# Patient Record
Sex: Male | Born: 1940 | Race: White | Hispanic: No | Marital: Married | Smoking: Never smoker
Health system: Southern US, Community
[De-identification: ages and names within clinical notes are randomized; demographics above are authoritative.]

## PROBLEM LIST (undated history)

## (undated) DIAGNOSIS — N400 Enlarged prostate without lower urinary tract symptoms: Secondary | ICD-10-CM

## (undated) DIAGNOSIS — K449 Diaphragmatic hernia without obstruction or gangrene: Secondary | ICD-10-CM

## (undated) DIAGNOSIS — F419 Anxiety disorder, unspecified: Secondary | ICD-10-CM

## (undated) DIAGNOSIS — G4733 Obstructive sleep apnea (adult) (pediatric): Secondary | ICD-10-CM

## (undated) DIAGNOSIS — R112 Nausea with vomiting, unspecified: Secondary | ICD-10-CM

## (undated) DIAGNOSIS — Z9889 Other specified postprocedural states: Secondary | ICD-10-CM

## (undated) DIAGNOSIS — T7840XA Allergy, unspecified, initial encounter: Secondary | ICD-10-CM

## (undated) DIAGNOSIS — M810 Age-related osteoporosis without current pathological fracture: Secondary | ICD-10-CM

## (undated) DIAGNOSIS — E785 Hyperlipidemia, unspecified: Secondary | ICD-10-CM

## (undated) DIAGNOSIS — I350 Nonrheumatic aortic (valve) stenosis: Secondary | ICD-10-CM

## (undated) DIAGNOSIS — Z86010 Personal history of colonic polyps: Secondary | ICD-10-CM

## (undated) DIAGNOSIS — M1990 Unspecified osteoarthritis, unspecified site: Secondary | ICD-10-CM

## (undated) DIAGNOSIS — N200 Calculus of kidney: Secondary | ICD-10-CM

## (undated) DIAGNOSIS — K5792 Diverticulitis of intestine, part unspecified, without perforation or abscess without bleeding: Secondary | ICD-10-CM

## (undated) DIAGNOSIS — J986 Disorders of diaphragm: Secondary | ICD-10-CM

## (undated) DIAGNOSIS — K219 Gastro-esophageal reflux disease without esophagitis: Secondary | ICD-10-CM

## (undated) DIAGNOSIS — Z860101 Personal history of adenomatous and serrated colon polyps: Secondary | ICD-10-CM

## (undated) DIAGNOSIS — G4730 Sleep apnea, unspecified: Secondary | ICD-10-CM

## (undated) DIAGNOSIS — K2270 Barrett's esophagus without dysplasia: Secondary | ICD-10-CM

## (undated) DIAGNOSIS — M4800 Spinal stenosis, site unspecified: Secondary | ICD-10-CM

## (undated) HISTORY — DX: Sleep apnea, unspecified: G47.30

## (undated) HISTORY — DX: Hyperlipidemia, unspecified: E78.5

## (undated) HISTORY — PX: ROTATOR CUFF REPAIR: SHX139

## (undated) HISTORY — DX: Personal history of adenomatous and serrated colon polyps: Z86.0101

## (undated) HISTORY — DX: Allergy, unspecified, initial encounter: T78.40XA

## (undated) HISTORY — PX: EYE MUSCLE SURGERY: SHX370

## (undated) HISTORY — DX: Diverticulitis of intestine, part unspecified, without perforation or abscess without bleeding: K57.92

## (undated) HISTORY — DX: Gastro-esophageal reflux disease without esophagitis: K21.9

## (undated) HISTORY — PX: ESOPHAGOGASTRODUODENOSCOPY: SHX1529

## (undated) HISTORY — DX: Nonrheumatic aortic (valve) stenosis: I35.0

## (undated) HISTORY — DX: Benign prostatic hyperplasia without lower urinary tract symptoms: N40.0

## (undated) HISTORY — DX: Barrett's esophagus without dysplasia: K22.70

## (undated) HISTORY — PX: VASECTOMY: SHX75

## (undated) HISTORY — PX: KNEE ARTHROSCOPY: SUR90

## (undated) HISTORY — DX: Anxiety disorder, unspecified: F41.9

## (undated) HISTORY — DX: Diaphragmatic hernia without obstruction or gangrene: K44.9

## (undated) HISTORY — DX: Calculus of kidney: N20.0

## (undated) HISTORY — DX: Disorders of diaphragm: J98.6

## (undated) HISTORY — PX: TENDON REPAIR: SHX5111

## (undated) HISTORY — DX: Age-related osteoporosis without current pathological fracture: M81.0

## (undated) HISTORY — DX: Obstructive sleep apnea (adult) (pediatric): G47.33

## (undated) HISTORY — DX: Personal history of colonic polyps: Z86.010

## (undated) HISTORY — DX: Spinal stenosis, site unspecified: M48.00

## (undated) HISTORY — DX: Unspecified osteoarthritis, unspecified site: M19.90

---

## 2002-07-31 ENCOUNTER — Encounter: Payer: Self-pay | Admitting: Internal Medicine

## 2002-09-13 ENCOUNTER — Encounter: Payer: Self-pay | Admitting: Internal Medicine

## 2002-09-18 ENCOUNTER — Encounter: Payer: Self-pay | Admitting: Gastroenterology

## 2002-09-18 ENCOUNTER — Ambulatory Visit (HOSPITAL_COMMUNITY): Admission: RE | Admit: 2002-09-18 | Discharge: 2002-09-18 | Payer: Self-pay | Admitting: Gastroenterology

## 2003-03-26 ENCOUNTER — Encounter: Payer: Self-pay | Admitting: Internal Medicine

## 2004-09-15 ENCOUNTER — Ambulatory Visit: Payer: Self-pay | Admitting: Internal Medicine

## 2004-10-08 ENCOUNTER — Ambulatory Visit: Payer: Self-pay | Admitting: Gastroenterology

## 2005-03-22 ENCOUNTER — Ambulatory Visit: Payer: Self-pay | Admitting: Internal Medicine

## 2005-04-26 ENCOUNTER — Ambulatory Visit: Payer: Self-pay | Admitting: Internal Medicine

## 2005-07-01 ENCOUNTER — Ambulatory Visit: Payer: Self-pay | Admitting: Internal Medicine

## 2005-07-29 ENCOUNTER — Ambulatory Visit: Payer: Self-pay | Admitting: Internal Medicine

## 2005-08-22 ENCOUNTER — Ambulatory Visit: Payer: Self-pay | Admitting: Internal Medicine

## 2005-10-04 ENCOUNTER — Ambulatory Visit: Payer: Self-pay | Admitting: Internal Medicine

## 2006-02-08 ENCOUNTER — Ambulatory Visit: Payer: Self-pay | Admitting: Internal Medicine

## 2006-03-29 ENCOUNTER — Ambulatory Visit: Payer: Self-pay | Admitting: Internal Medicine

## 2006-04-20 ENCOUNTER — Ambulatory Visit: Payer: Self-pay | Admitting: Internal Medicine

## 2006-07-24 ENCOUNTER — Ambulatory Visit: Payer: Self-pay | Admitting: Internal Medicine

## 2006-08-08 ENCOUNTER — Ambulatory Visit: Payer: Self-pay | Admitting: Gastroenterology

## 2006-08-21 ENCOUNTER — Encounter (INDEPENDENT_AMBULATORY_CARE_PROVIDER_SITE_OTHER): Payer: Self-pay | Admitting: Specialist

## 2006-08-21 ENCOUNTER — Ambulatory Visit: Payer: Self-pay | Admitting: Gastroenterology

## 2006-10-03 ENCOUNTER — Ambulatory Visit: Payer: Self-pay | Admitting: Internal Medicine

## 2006-10-09 ENCOUNTER — Ambulatory Visit: Payer: Self-pay | Admitting: Internal Medicine

## 2006-10-09 DIAGNOSIS — J309 Allergic rhinitis, unspecified: Secondary | ICD-10-CM | POA: Insufficient documentation

## 2006-10-09 DIAGNOSIS — K219 Gastro-esophageal reflux disease without esophagitis: Secondary | ICD-10-CM | POA: Insufficient documentation

## 2006-10-09 DIAGNOSIS — E785 Hyperlipidemia, unspecified: Secondary | ICD-10-CM | POA: Insufficient documentation

## 2006-10-09 DIAGNOSIS — K5790 Diverticulosis of intestine, part unspecified, without perforation or abscess without bleeding: Secondary | ICD-10-CM | POA: Insufficient documentation

## 2007-03-02 ENCOUNTER — Ambulatory Visit: Payer: Self-pay | Admitting: Internal Medicine

## 2007-04-27 ENCOUNTER — Telehealth (INDEPENDENT_AMBULATORY_CARE_PROVIDER_SITE_OTHER): Payer: Self-pay | Admitting: *Deleted

## 2007-04-30 ENCOUNTER — Ambulatory Visit: Payer: Self-pay | Admitting: Internal Medicine

## 2007-05-07 ENCOUNTER — Ambulatory Visit: Payer: Self-pay | Admitting: Family Medicine

## 2007-06-05 ENCOUNTER — Ambulatory Visit: Payer: Self-pay | Admitting: Internal Medicine

## 2007-06-07 ENCOUNTER — Ambulatory Visit: Payer: Self-pay | Admitting: Gastroenterology

## 2007-06-14 ENCOUNTER — Ambulatory Visit (HOSPITAL_COMMUNITY): Admission: RE | Admit: 2007-06-14 | Discharge: 2007-06-14 | Payer: Self-pay | Admitting: Gastroenterology

## 2007-09-21 ENCOUNTER — Ambulatory Visit: Payer: Self-pay | Admitting: Internal Medicine

## 2007-09-21 DIAGNOSIS — M48062 Spinal stenosis, lumbar region with neurogenic claudication: Secondary | ICD-10-CM | POA: Insufficient documentation

## 2007-09-21 DIAGNOSIS — M48061 Spinal stenosis, lumbar region without neurogenic claudication: Secondary | ICD-10-CM | POA: Insufficient documentation

## 2007-10-08 ENCOUNTER — Encounter: Payer: Self-pay | Admitting: Internal Medicine

## 2007-10-19 ENCOUNTER — Ambulatory Visit (HOSPITAL_COMMUNITY): Admission: RE | Admit: 2007-10-19 | Discharge: 2007-10-19 | Payer: Self-pay | Admitting: Neurosurgery

## 2007-10-22 ENCOUNTER — Ambulatory Visit: Payer: Self-pay | Admitting: Cardiology

## 2007-11-06 ENCOUNTER — Telehealth: Payer: Self-pay | Admitting: Family Medicine

## 2007-12-12 ENCOUNTER — Ambulatory Visit: Payer: Self-pay | Admitting: Internal Medicine

## 2007-12-12 DIAGNOSIS — N138 Other obstructive and reflux uropathy: Secondary | ICD-10-CM | POA: Insufficient documentation

## 2007-12-12 DIAGNOSIS — N401 Enlarged prostate with lower urinary tract symptoms: Secondary | ICD-10-CM

## 2007-12-12 DIAGNOSIS — F411 Generalized anxiety disorder: Secondary | ICD-10-CM | POA: Insufficient documentation

## 2007-12-12 LAB — CONVERTED CEMR LAB
Direct LDL: 140 mg/dL
Ketones, urine, test strip: NEGATIVE
Nitrite: NEGATIVE
PSA: 3.85 ng/mL (ref 0.10–4.00)
Specific Gravity, Urine: 1.01
Total CHOL/HDL Ratio: 5.6
Triglycerides: 143 mg/dL (ref 0–149)
Urobilinogen, UA: 0.2
VLDL: 29 mg/dL (ref 0–40)
WBC Urine, dipstick: NEGATIVE

## 2007-12-17 LAB — CONVERTED CEMR LAB
Albumin: 4.4 g/dL (ref 3.5–5.2)
Basophils Absolute: 0.1 10*3/uL (ref 0.0–0.1)
Basophils Relative: 0.8 % (ref 0.0–3.0)
CO2: 31 meq/L (ref 19–32)
Chloride: 100 meq/L (ref 96–112)
Eosinophils Absolute: 0.6 10*3/uL (ref 0.0–0.7)
GFR calc non Af Amer: 79 mL/min
Glucose, Bld: 87 mg/dL (ref 70–99)
Hemoglobin: 14.7 g/dL (ref 13.0–17.0)
Lymphocytes Relative: 22.2 % (ref 12.0–46.0)
MCHC: 34.4 g/dL (ref 30.0–36.0)
MCV: 86 fL (ref 78.0–100.0)
Neutro Abs: 4.3 10*3/uL (ref 1.4–7.7)
PSA: 5.21 ng/mL — ABNORMAL HIGH (ref 0.10–4.00)
Potassium: 4.3 meq/L (ref 3.5–5.1)
RBC: 4.96 M/uL (ref 4.22–5.81)
RDW: 12.7 % (ref 11.5–14.6)
Sodium: 138 meq/L (ref 135–145)
TSH: 0.74 microintl units/mL (ref 0.35–5.50)
Total Bilirubin: 1.1 mg/dL (ref 0.3–1.2)

## 2008-03-24 ENCOUNTER — Telehealth: Payer: Self-pay | Admitting: Internal Medicine

## 2008-04-21 ENCOUNTER — Encounter: Payer: Self-pay | Admitting: Internal Medicine

## 2008-04-22 ENCOUNTER — Telehealth: Payer: Self-pay | Admitting: Internal Medicine

## 2008-05-09 HISTORY — PX: LIPOMA EXCISION: SHX5283

## 2008-08-04 ENCOUNTER — Ambulatory Visit: Payer: Self-pay | Admitting: Internal Medicine

## 2008-08-04 DIAGNOSIS — R972 Elevated prostate specific antigen [PSA]: Secondary | ICD-10-CM | POA: Insufficient documentation

## 2008-08-13 DIAGNOSIS — K2270 Barrett's esophagus without dysplasia: Secondary | ICD-10-CM | POA: Insufficient documentation

## 2008-08-13 DIAGNOSIS — I10 Essential (primary) hypertension: Secondary | ICD-10-CM | POA: Insufficient documentation

## 2008-08-13 DIAGNOSIS — K589 Irritable bowel syndrome without diarrhea: Secondary | ICD-10-CM | POA: Insufficient documentation

## 2008-08-13 DIAGNOSIS — K449 Diaphragmatic hernia without obstruction or gangrene: Secondary | ICD-10-CM | POA: Insufficient documentation

## 2008-08-13 DIAGNOSIS — K3184 Gastroparesis: Secondary | ICD-10-CM | POA: Insufficient documentation

## 2008-08-28 ENCOUNTER — Ambulatory Visit: Payer: Self-pay | Admitting: Gastroenterology

## 2008-09-09 ENCOUNTER — Encounter: Payer: Self-pay | Admitting: Internal Medicine

## 2008-09-09 ENCOUNTER — Ambulatory Visit: Payer: Self-pay

## 2008-09-11 ENCOUNTER — Encounter: Payer: Self-pay | Admitting: Internal Medicine

## 2008-10-13 ENCOUNTER — Encounter: Payer: Self-pay | Admitting: Internal Medicine

## 2008-10-13 ENCOUNTER — Encounter: Admission: RE | Admit: 2008-10-13 | Discharge: 2008-10-13 | Payer: Self-pay | Admitting: Neurosurgery

## 2008-10-15 ENCOUNTER — Encounter: Payer: Self-pay | Admitting: Internal Medicine

## 2008-10-24 ENCOUNTER — Encounter: Payer: Self-pay | Admitting: Cardiology

## 2008-10-30 ENCOUNTER — Ambulatory Visit: Payer: Self-pay | Admitting: Internal Medicine

## 2008-11-24 ENCOUNTER — Telehealth: Payer: Self-pay | Admitting: Internal Medicine

## 2008-12-04 DIAGNOSIS — E669 Obesity, unspecified: Secondary | ICD-10-CM | POA: Insufficient documentation

## 2008-12-09 ENCOUNTER — Encounter: Payer: Self-pay | Admitting: Physician Assistant

## 2008-12-09 ENCOUNTER — Ambulatory Visit: Payer: Self-pay | Admitting: Cardiovascular Disease

## 2008-12-10 ENCOUNTER — Telehealth (INDEPENDENT_AMBULATORY_CARE_PROVIDER_SITE_OTHER): Payer: Self-pay | Admitting: *Deleted

## 2008-12-11 ENCOUNTER — Encounter: Payer: Self-pay | Admitting: Cardiology

## 2008-12-11 ENCOUNTER — Ambulatory Visit: Payer: Self-pay

## 2008-12-15 ENCOUNTER — Encounter: Payer: Self-pay | Admitting: Internal Medicine

## 2008-12-17 ENCOUNTER — Telehealth: Payer: Self-pay | Admitting: Cardiology

## 2008-12-25 ENCOUNTER — Telehealth (INDEPENDENT_AMBULATORY_CARE_PROVIDER_SITE_OTHER): Payer: Self-pay | Admitting: *Deleted

## 2008-12-25 ENCOUNTER — Ambulatory Visit: Payer: Self-pay | Admitting: Cardiology

## 2008-12-26 ENCOUNTER — Inpatient Hospital Stay (HOSPITAL_BASED_OUTPATIENT_CLINIC_OR_DEPARTMENT_OTHER): Admission: RE | Admit: 2008-12-26 | Discharge: 2008-12-26 | Payer: Self-pay | Admitting: Cardiology

## 2008-12-26 ENCOUNTER — Ambulatory Visit: Payer: Self-pay | Admitting: Cardiology

## 2008-12-29 LAB — CONVERTED CEMR LAB
Basophils Absolute: 0 10*3/uL (ref 0.0–0.1)
CO2: 31 meq/L (ref 19–32)
Calcium: 8.5 mg/dL (ref 8.4–10.5)
Creatinine, Ser: 1.1 mg/dL (ref 0.4–1.5)
Eosinophils Absolute: 0.2 10*3/uL (ref 0.0–0.7)
Glucose, Bld: 86 mg/dL (ref 70–99)
INR: 1 (ref 0.8–1.0)
Lymphocytes Relative: 17.6 % (ref 12.0–46.0)
MCHC: 34.8 g/dL (ref 30.0–36.0)
Neutrophils Relative %: 69.3 % (ref 43.0–77.0)
Prothrombin Time: 10.6 s (ref 9.1–11.7)
RBC: 4.55 M/uL (ref 4.22–5.81)
RDW: 13.6 % (ref 11.5–14.6)
aPTT: 25.7 s (ref 21.7–28.8)

## 2009-01-05 ENCOUNTER — Ambulatory Visit: Payer: Self-pay | Admitting: Cardiology

## 2009-02-23 ENCOUNTER — Encounter: Payer: Self-pay | Admitting: Internal Medicine

## 2009-03-26 ENCOUNTER — Encounter: Payer: Self-pay | Admitting: Neurosurgery

## 2009-04-07 ENCOUNTER — Ambulatory Visit: Payer: Self-pay | Admitting: Internal Medicine

## 2009-04-08 ENCOUNTER — Encounter: Payer: Self-pay | Admitting: Neurosurgery

## 2009-05-09 ENCOUNTER — Encounter: Payer: Self-pay | Admitting: Neurosurgery

## 2009-05-22 ENCOUNTER — Ambulatory Visit (HOSPITAL_COMMUNITY)
Admission: RE | Admit: 2009-05-22 | Discharge: 2009-05-22 | Payer: Self-pay | Source: Home / Self Care | Admitting: Orthopedic Surgery

## 2009-06-03 ENCOUNTER — Encounter: Payer: Self-pay | Admitting: Cardiology

## 2009-07-10 ENCOUNTER — Ambulatory Visit: Payer: Self-pay | Admitting: Internal Medicine

## 2009-07-10 DIAGNOSIS — M159 Polyosteoarthritis, unspecified: Secondary | ICD-10-CM | POA: Insufficient documentation

## 2009-07-15 ENCOUNTER — Encounter (INDEPENDENT_AMBULATORY_CARE_PROVIDER_SITE_OTHER): Payer: Self-pay | Admitting: *Deleted

## 2009-07-15 LAB — CONVERTED CEMR LAB
ALT: 18 units/L (ref 0–53)
AST: 20 units/L (ref 0–37)
BUN: 9 mg/dL (ref 6–23)
Basophils Absolute: 0 10*3/uL (ref 0.0–0.1)
Bilirubin, Direct: 0.2 mg/dL (ref 0.0–0.3)
Calcium: 8.9 mg/dL (ref 8.4–10.5)
Eosinophils Absolute: 0.3 10*3/uL (ref 0.0–0.7)
Glucose, Bld: 92 mg/dL (ref 70–99)
Hemoglobin: 13.3 g/dL (ref 13.0–17.0)
Lymphocytes Relative: 15.9 % (ref 12.0–46.0)
MCHC: 33.9 g/dL (ref 30.0–36.0)
Monocytes Absolute: 0.6 10*3/uL (ref 0.1–1.0)
Neutro Abs: 5.7 10*3/uL (ref 1.4–7.7)
Phosphorus: 3.3 mg/dL (ref 2.3–4.6)
Potassium: 4 meq/L (ref 3.5–5.1)
RDW: 12.2 % (ref 11.5–14.6)
Total Bilirubin: 0.9 mg/dL (ref 0.3–1.2)

## 2009-08-21 ENCOUNTER — Encounter: Payer: Self-pay | Admitting: Internal Medicine

## 2009-10-13 ENCOUNTER — Ambulatory Visit: Payer: Self-pay | Admitting: Family Medicine

## 2009-11-03 ENCOUNTER — Telehealth: Payer: Self-pay | Admitting: Internal Medicine

## 2009-11-05 ENCOUNTER — Telehealth: Payer: Self-pay | Admitting: Gastroenterology

## 2009-11-18 ENCOUNTER — Encounter (INDEPENDENT_AMBULATORY_CARE_PROVIDER_SITE_OTHER): Payer: Self-pay

## 2009-11-20 ENCOUNTER — Ambulatory Visit: Payer: Self-pay | Admitting: Gastroenterology

## 2009-11-27 ENCOUNTER — Ambulatory Visit: Payer: Self-pay | Admitting: Gastroenterology

## 2009-11-27 LAB — HM COLONOSCOPY

## 2009-11-30 ENCOUNTER — Telehealth: Payer: Self-pay | Admitting: Gastroenterology

## 2009-12-02 ENCOUNTER — Encounter: Payer: Self-pay | Admitting: Gastroenterology

## 2009-12-18 ENCOUNTER — Ambulatory Visit: Payer: Self-pay | Admitting: Internal Medicine

## 2010-01-08 ENCOUNTER — Encounter: Payer: Self-pay | Admitting: Internal Medicine

## 2010-02-02 ENCOUNTER — Encounter: Payer: Self-pay | Admitting: Internal Medicine

## 2010-02-03 ENCOUNTER — Ambulatory Visit: Payer: Self-pay | Admitting: Internal Medicine

## 2010-02-15 ENCOUNTER — Telehealth: Payer: Self-pay | Admitting: Family Medicine

## 2010-02-24 ENCOUNTER — Telehealth: Payer: Self-pay | Admitting: Internal Medicine

## 2010-03-04 ENCOUNTER — Encounter: Payer: Self-pay | Admitting: Internal Medicine

## 2010-03-04 ENCOUNTER — Ambulatory Visit: Payer: Self-pay | Admitting: Internal Medicine

## 2010-04-06 ENCOUNTER — Encounter: Payer: Self-pay | Admitting: Cardiology

## 2010-04-19 ENCOUNTER — Ambulatory Visit: Payer: Self-pay | Admitting: Cardiology

## 2010-04-19 ENCOUNTER — Encounter: Payer: Self-pay | Admitting: Cardiology

## 2010-04-26 ENCOUNTER — Ambulatory Visit: Payer: Self-pay | Admitting: Internal Medicine

## 2010-05-09 HISTORY — PX: JOINT REPLACEMENT: SHX530

## 2010-05-21 ENCOUNTER — Ambulatory Visit
Admission: RE | Admit: 2010-05-21 | Discharge: 2010-05-21 | Payer: Self-pay | Source: Home / Self Care | Attending: Internal Medicine | Admitting: Internal Medicine

## 2010-05-21 DIAGNOSIS — R0982 Postnasal drip: Secondary | ICD-10-CM | POA: Insufficient documentation

## 2010-05-26 LAB — DIFFERENTIAL
Basophils Absolute: 0 10*3/uL (ref 0.0–0.1)
Basophils Relative: 1 % (ref 0–1)
Eosinophils Absolute: 0.4 10*3/uL (ref 0.0–0.7)
Eosinophils Relative: 5 % (ref 0–5)
Lymphocytes Relative: 20 % (ref 12–46)
Lymphs Abs: 1.5 10*3/uL (ref 0.7–4.0)
Monocytes Absolute: 0.7 10*3/uL (ref 0.1–1.0)
Monocytes Relative: 9 % (ref 3–12)
Neutro Abs: 5 10*3/uL (ref 1.7–7.7)
Neutrophils Relative %: 65 % (ref 43–77)

## 2010-05-26 LAB — COMPREHENSIVE METABOLIC PANEL
ALT: 23 U/L (ref 0–53)
AST: 24 U/L (ref 0–37)
Albumin: 4.2 g/dL (ref 3.5–5.2)
Alkaline Phosphatase: 45 U/L (ref 39–117)
BUN: 12 mg/dL (ref 6–23)
CO2: 29 mEq/L (ref 19–32)
Calcium: 9.6 mg/dL (ref 8.4–10.5)
Chloride: 103 mEq/L (ref 96–112)
Creatinine, Ser: 0.99 mg/dL (ref 0.4–1.5)
GFR calc Af Amer: >60 mL/min (ref >60)
GFR calc non Af Amer: >60 mL/min (ref >60)
Glucose, Bld: 103 mg/dL — ABNORMAL HIGH (ref 70–99)
Potassium: 4.4 mEq/L (ref 3.5–5.1)
Sodium: 140 mEq/L (ref 135–145)
Total Bilirubin: 1.2 mg/dL (ref 0.3–1.2)
Total Protein: 6.3 g/dL (ref 6.0–8.3)

## 2010-05-26 LAB — CBC
HCT: 43.5 % (ref 39.0–52.0)
Hemoglobin: 14.5 g/dL (ref 13.0–17.0)
MCH: 28.2 pg (ref 26.0–34.0)
MCHC: 33.3 g/dL (ref 30.0–36.0)
MCV: 84.5 fL (ref 78.0–100.0)
Platelets: 223 10*3/uL (ref 150–400)
RBC: 5.15 MIL/uL (ref 4.22–5.81)
RDW: 13.4 % (ref 11.5–15.5)
WBC: 7.7 10*3/uL (ref 4.0–10.5)

## 2010-05-26 LAB — SURGICAL PCR SCREEN
MRSA, PCR: NEGATIVE
Staphylococcus aureus: NEGATIVE

## 2010-05-26 LAB — URINALYSIS, ROUTINE W REFLEX MICROSCOPIC
Bilirubin Urine: NEGATIVE
Hgb urine dipstick: NEGATIVE
Ketones, ur: NEGATIVE mg/dL
Nitrite: NEGATIVE
Protein, ur: NEGATIVE mg/dL
Specific Gravity, Urine: 1.022 (ref 1.005–1.030)
Urine Glucose, Fasting: NEGATIVE mg/dL
Urobilinogen, UA: 1 mg/dL (ref 0.0–1.0)
pH: 6 (ref 5.0–8.0)

## 2010-05-26 LAB — APTT: aPTT: 28 seconds (ref 24–37)

## 2010-05-26 LAB — PROTIME-INR
INR: 1.02 (ref 0.00–1.49)
Prothrombin Time: 13.6 seconds (ref 11.6–15.2)

## 2010-05-30 ENCOUNTER — Encounter: Payer: Self-pay | Admitting: Neurosurgery

## 2010-05-31 ENCOUNTER — Inpatient Hospital Stay (HOSPITAL_COMMUNITY)
Admission: RE | Admit: 2010-05-31 | Discharge: 2010-06-03 | Payer: Self-pay | Source: Home / Self Care | Attending: Orthopedic Surgery | Admitting: Orthopedic Surgery

## 2010-05-31 LAB — URINE CULTURE
Colony Count: NO GROWTH
Culture  Setup Time: 201201171503
Culture: NO GROWTH

## 2010-06-01 LAB — ABO/RH: ABO/RH(D): A POS

## 2010-06-02 LAB — CBC
HCT: 29.6 % — ABNORMAL LOW (ref 39.0–52.0)
HCT: 26.7 % — ABNORMAL LOW (ref 39.0–52.0)
Hemoglobin: 9.8 g/dL — ABNORMAL LOW (ref 13.0–17.0)
Hemoglobin: 8.9 g/dL — ABNORMAL LOW (ref 13.0–17.0)
MCH: 28.2 pg (ref 26.0–34.0)
MCH: 28.3 pg (ref 26.0–34.0)
MCHC: 33.1 g/dL (ref 30.0–36.0)
MCHC: 33.3 g/dL (ref 30.0–36.0)
MCV: 85.1 fL (ref 78.0–100.0)
MCV: 85 fL (ref 78.0–100.0)
Platelets: 200 10*3/uL (ref 150–400)
Platelets: 151 10*3/uL (ref 150–400)
RBC: 3.48 MIL/uL — ABNORMAL LOW (ref 4.22–5.81)
RBC: 3.14 MIL/uL — ABNORMAL LOW (ref 4.22–5.81)
RDW: 13.5 % (ref 11.5–15.5)
RDW: 13.5 % (ref 11.5–15.5)
WBC: 11.4 10*3/uL — ABNORMAL HIGH (ref 4.0–10.5)
WBC: 8.2 10*3/uL (ref 4.0–10.5)

## 2010-06-02 LAB — BASIC METABOLIC PANEL
BUN: 19 mg/dL (ref 6–23)
BUN: 12 mg/dL (ref 6–23)
CO2: 25 mEq/L (ref 19–32)
CO2: 28 mEq/L (ref 19–32)
Calcium: 7.5 mg/dL — ABNORMAL LOW (ref 8.4–10.5)
Calcium: 7.8 mg/dL — ABNORMAL LOW (ref 8.4–10.5)
Chloride: 108 mEq/L (ref 96–112)
Chloride: 102 mEq/L (ref 96–112)
Creatinine, Ser: 1.02 mg/dL (ref 0.4–1.5)
Creatinine, Ser: 0.95 mg/dL (ref 0.4–1.5)
GFR calc Af Amer: >60 mL/min (ref >60)
GFR calc Af Amer: >60 mL/min (ref >60)
GFR calc non Af Amer: >60 mL/min (ref >60)
GFR calc non Af Amer: >60 mL/min (ref >60)
Glucose, Bld: 138 mg/dL — ABNORMAL HIGH (ref 70–99)
Glucose, Bld: 121 mg/dL — ABNORMAL HIGH (ref 70–99)
Potassium: 5.5 mEq/L — ABNORMAL HIGH (ref 3.5–5.1)
Potassium: 3.7 mEq/L (ref 3.5–5.1)
Sodium: 138 mEq/L (ref 135–145)
Sodium: 134 mEq/L — ABNORMAL LOW (ref 135–145)

## 2010-06-03 LAB — BASIC METABOLIC PANEL
BUN: 10 mg/dL (ref 6–23)
CO2: 29 mEq/L (ref 19–32)
Calcium: 8 mg/dL — ABNORMAL LOW (ref 8.4–10.5)
Chloride: 100 mEq/L (ref 96–112)
Creatinine, Ser: 0.93 mg/dL (ref 0.4–1.5)
GFR calc Af Amer: >60 mL/min (ref >60)
GFR calc non Af Amer: >60 mL/min (ref >60)
Glucose, Bld: 112 mg/dL — ABNORMAL HIGH (ref 70–99)
Potassium: 3.7 mEq/L (ref 3.5–5.1)
Sodium: 138 mEq/L (ref 135–145)

## 2010-06-03 LAB — CBC
HCT: 23.3 % — ABNORMAL LOW (ref 39.0–52.0)
Hemoglobin: 7.9 g/dL — ABNORMAL LOW (ref 13.0–17.0)
MCH: 28.2 pg (ref 26.0–34.0)
MCHC: 33.9 g/dL (ref 30.0–36.0)
MCV: 83.2 fL (ref 78.0–100.0)
Platelets: 156 10*3/uL (ref 150–400)
RBC: 2.8 MIL/uL — ABNORMAL LOW (ref 4.22–5.81)
RDW: 13.1 % (ref 11.5–15.5)
WBC: 7.2 10*3/uL (ref 4.0–10.5)

## 2010-06-03 LAB — HEMOGLOBIN AND HEMATOCRIT, BLOOD: HCT: 23.7 % — ABNORMAL LOW (ref 39.0–52.0)

## 2010-06-04 ENCOUNTER — Encounter: Payer: Self-pay | Admitting: Internal Medicine

## 2010-06-04 LAB — TYPE AND SCREEN
ABO/RH(D): A POS
Antibody Screen: NEGATIVE
Unit division: 0
Unit division: 0

## 2010-06-06 ENCOUNTER — Emergency Department: Payer: Self-pay | Admitting: Emergency Medicine

## 2010-06-06 LAB — CONVERTED CEMR LAB
ALT: 25 units/L (ref 0–53)
AST: 20 units/L (ref 0–37)
BUN: 19 mg/dL (ref 6–23)
Basophils Relative: 0.5 % (ref 0.0–3.0)
Chloride: 105 meq/L (ref 96–112)
Eosinophils Relative: 5 % (ref 0.0–5.0)
GFR calc non Af Amer: 89.43 mL/min (ref >60)
HCT: 41.8 % (ref 39.0–52.0)
Hemoglobin: 14.2 g/dL (ref 13.0–17.0)
Lymphs Abs: 1.6 10*3/uL (ref 0.7–4.0)
Monocytes Relative: 8.6 % (ref 3.0–12.0)
Platelets: 193 10*3/uL (ref 150.0–400.0)
Potassium: 4.1 meq/L (ref 3.5–5.1)
RBC: 4.85 M/uL (ref 4.22–5.81)
Sodium: 141 meq/L (ref 135–145)
TSH: 1.24 microintl units/mL (ref 0.35–5.50)
Total Bilirubin: 1.1 mg/dL (ref 0.3–1.2)
Total Protein: 6.1 g/dL (ref 6.0–8.3)
WBC: 7.8 10*3/uL (ref 4.5–10.5)

## 2010-06-08 NOTE — Progress Notes (Signed)
Summary: white spots in mouth  Phone Note Call from Patient Call back at [phone]   Caller: Patient Summary of Call: Pt finished his antibiotic on saturday and now has white spots in his mouth.  Mouth is sore.  He is asking that something be [facility] to [facility] [place], [phone].  I told him he would need to be seen but he wanted me to send a note. Initial call taken by: Lowella Petties CMA,  February 24, 2010 2:43 PM  Follow-up for Phone Call        Okay to send Rx for mycostatin susp 5cc swish and spit four times daily till clear #16 ounces x 0  will need to be seen if this doesn't clear it Follow-up by: Cindee Salt MD,  February 25, 2010 10:30 AM  Additional Follow-up for Phone Call Additional follow up Details #1::        rx sent to [facility] [place], left message on machine that pt would need to be seen if not better. Additional Follow-up by: Mervin Hack CMA Duncan Dull),  February 25, 2010 1:01 PM    New/Updated Medications: NYSTATIN 100000 UNIT/ML SUSP (NYSTATIN) 5cc swish and spit four times daily till clear Prescriptions: NYSTATIN 100000 UNIT/ML SUSP (NYSTATIN) 5cc swish and spit four times daily till clear  #16oz x 0   Entered by:   Mervin Hack CMA (AAMA)   Authorized by:   Cindee Salt MD   Signed by:   Mervin Hack CMA (AAMA) on 02/25/2010   Method used:   Electronically to        [facility]. [phone]* (retail)       [address]       [place], [place]  [id number]       Ph: [phone]       Fax: [phone]   RxID:   [id number]

## 2010-06-08 NOTE — Letter (Signed)
Summary: Patient Notice-[facility]  [address] [place], [place] [address]   Phone: [phone]  Fax: [phone]        December 02, 2009 MRN: [id number]    [facility] [address] [place], [place]  [address]    Dear [name],  I am pleased to inform you that the biopsies taken during your recent endoscopic examination did not show any evidence of cancer upon pathologic examination.  However, your biopsies indicate you have a condition known as Barrett's esophagus. While not cancer, it is pre-cancerous (can progress to cancer) and needs to be monitored with repeat endoscopic examination and biopsies.  Fortunately, it is quite rare that this develops into cancer, but careful monitoring of the condition along with taking your medication as prescribed is important in reducing the risk of developing cancer.  It is my recommendation that you have a repeat upper gastrointestinal endoscopic examination in 3_ years.  Additional information/recommendations:  __Please call [phone] to schedule a return visit to further      evaluate your condition.  x__Continue with treatment plan as outlined the day of your exam.  Please call us if you have or develop heartburn, reflux symptoms, any swallowing problems, or if you have questions about your condition that have not been fully answered at this time.  Sincerely,  Mardella Layman MD [facility]  This letter has been electronically signed by your physician.  Appended Document: Patient Notice-[facility] letter mailed

## 2010-06-08 NOTE — Procedures (Signed)
Summary: Colonoscopy  Patient: [name] Note: All result statuses are Final unless otherwise noted.  Tests: (1) Colonoscopy (COL)   COL Colonoscopy           DONE     [facility]     [address]     [place], [place]  [address]           COLONOSCOPY PROCEDURE REPORT           PATIENT:  [name], [name]  MR#:  [id number]     BIRTHDATE:  1940/06/09, 69 yrs. old  GENDER:  male     ENDOSCOPIST:  Vania Rea. Jarold Motto, MD, [facility]     REF. BY:     PROCEDURE DATE:  11/27/2009     PROCEDURE:  Colonoscopy with snare polypectomy     ASA CLASS:  Class II     INDICATIONS:  Routine Risk Screening     MEDICATIONS:   Fentanyl 50 mcg IV, Versed 7 mg IV           DESCRIPTION OF PROCEDURE:   After the risks benefits and     alternatives of the procedure were thoroughly explained, informed     consent was obtained.  Digital rectal exam was performed and     revealed no abnormalities.   The LB [id number] endoscope     was introduced through the anus and advanced to the cecum, which     was identified by both the appendix and ileocecal valve, without     limitations.  The quality of the prep was excellent, using     MoviPrep.  The instrument was then slowly withdrawn as the colon     was fully examined.     <<PROCEDUREIMAGES>>           FINDINGS:  Moderate diverticulosis was found in the sigmoid to     descending colon segments.  A sessile polyp was found. FLAT     POLYP COLD SNARE EXCISED.TO PATH.  This was otherwise a normal     examination of the colon.   Retroflexed views in the rectum     revealed no abnormalities.    The scope was then withdrawn from     the patient and the procedure completed.           COMPLICATIONS:  None     ENDOSCOPIC IMPRESSION:     1) Moderate diverticulosis in the sigmoid to descending colon     segments     2) Sessile polyp     3) Otherwise normal examination     R/O ADENOMA     RECOMMENDATIONS:     1) Follow up colonoscopy in 5 years    2) high fiber diet     REPEAT EXAM:  No           ______________________________     Vania Rea. Jarold Motto, MD, [facility]           CC:  Karie Schwalbe, MD           n.     Rosalie DoctorMarland Kitchen   Vania Rea. Patterson at 11/27/2009 02:49 PM           Melodie Bouillon, [id number]  Note: An exclamation mark (!) indicates a result that was not dispersed into the flowsheet. Document Creation Date: 11/27/2009 2:50 PM _______________________________________________________________________  (1) Order result status: Final Collection or observation date-time: 11/27/2009 14:43 Requested date-time:  Receipt date-time:  Reported date-time:  Referring  Physician:   Ordering Physician: Sheryn Bison [id number]) Specimen Source:  Source: Launa Grill Order Number: [id number] Lab site:   Appended Document: Colonoscopy     Procedures Next Due Date:    Colonoscopy: 11/2014

## 2010-06-08 NOTE — Letter (Signed)
Summary: [facility] Instructions  [facility]  [address] [place], [place] [address]   Phone: [phone]  Fax: [phone]       CARTEL MAUSS    69-30-1942    MRN: [id number]        Procedure Day Dorna Bloom:  Farrell Ours  11/27/09     Arrival Time:  1:00PM     Procedure Time:  2:00PM     Location of Procedure:                    _ X_  [facility] (4th Floor)                        PREPARATION FOR COLONOSCOPY WITH MOVIPREP   Starting 5 days prior to your procedure 11/22/09 do not eat nuts, seeds, popcorn, corn, beans, peas,  salads, or any raw vegetables.  Do not take any fiber supplements (e.g. Metamucil, Citrucel, and Benefiber).  THE DAY BEFORE YOUR PROCEDURE         DATE: 11/26/09   DAY: THURSDAY  1.  Drink clear liquids the entire day-NO SOLID FOOD  2.  Do not drink anything colored red or purple.  Avoid juices with pulp.  No orange juice.  3.  Drink at least 64 oz. (8 glasses) of fluid/clear liquids during the day to prevent dehydration and help the prep work efficiently.  CLEAR LIQUIDS INCLUDE: Water Jello Ice Popsicles Tea (sugar ok, no milk/cream) Powdered fruit flavored drinks Coffee (sugar ok, no milk/cream) Gatorade Juice: apple, white grape, white cranberry  Lemonade Clear bullion, consomm, broth Carbonated beverages (any kind) Strained chicken noodle soup Hard Candy                             4.  In the morning, mix first dose of MoviPrep solution:    Empty 1 Pouch A and 1 Pouch B into the disposable container    Add lukewarm drinking water to the top line of the container. Mix to dissolve    Refrigerate (mixed solution should be used within 24 hrs)  5.  Begin drinking the prep at 5:00 p.m. The MoviPrep container is divided by 4 marks.   Every 15 minutes drink the solution down to the next mark (approximately 8 oz) until the full liter is complete.   6.  Follow completed prep with 16 oz of clear liquid of your choice  (Nothing red or purple).  Continue to drink clear liquids until bedtime.  7.  Before going to bed, mix second dose of MoviPrep solution:    Empty 1 Pouch A and 1 Pouch B into the disposable container    Add lukewarm drinking water to the top line of the container. Mix to dissolve    Refrigerate  THE DAY OF YOUR PROCEDURE      DATE: 11/27/09  DAY: FRIDAY  Beginning at 9:00AM (5 hours before procedure):         1. Every 15 minutes, drink the solution down to the next mark (approx 8 oz) until the full liter is complete.  2. Follow completed prep with 16 oz. of clear liquid of your choice.    3. You may drink clear liquids until 12:00PM (2 HOURS BEFORE PROCEDURE).   MEDICATION INSTRUCTIONS  Unless otherwise instructed, you should take regular prescription medications with a small sip of water   as early as possible the  morning of your procedure.         OTHER INSTRUCTIONS  You will need a responsible adult at least 69 years of age to accompany you and drive you home.   This person must remain in the waiting room during your procedure.  Wear loose fitting clothing that is easily removed.  Leave jewelry and other valuables at home.  However, you may wish to bring a book to read or  an iPod/MP3 player to listen to music as you wait for your procedure to start.  Remove all body piercing jewelry and leave at home.  Total time from sign-in until discharge is approximately 2-3 hours.  You should go home directly after your procedure and rest.  You can resume normal activities the  day after your procedure.  The day of your procedure you should not:   Drive   Make legal decisions   Operate machinery   Drink alcohol   Return to work  You will receive specific instructions about eating, activities and medications before you leave.    The above instructions have been reviewed and explained to me by   69lis Rias RN  November 20, 2009 9:05 AM     I fully understand and  can verbalize these instructions _____________________________ Date _________

## 2010-06-08 NOTE — Letter (Signed)
Summary: [facility]   Imported By: Sherian Rein 01/20/2010 09:44:47  _____________________________________________________________________  External Attachment:    Type:   Image     Comment:   External Document  Appended Document: [facility] doing okay on finasteride and doxazosin Biopsy negative last year and following PSA

## 2010-06-08 NOTE — Letter (Signed)
Summary: Endoscopy Letter  [facility]  [address] [place], [place] [address]   Phone: [phone]  Fax: [phone]      July 15, 2009 MRN: [id number]   [facility] [address] [place], [place]  [address]   Dear Mr. Spiers,   According to your medical record, it is time for you to schedule an Endoscopy. Endoscopic screening is recommended for patients with certain upper digestive tract conditions because of associated increased risk for cancers of the upper digestive system.  This letter has been generated based on the recommendations made at the time of your prior procedure. If you feel that in your particular situation this may no longer apply, please contact our office.  Please call our office at [phone]) to schedule this appointment or to update your records at your earliest convenience.  Thank you for cooperating with [place] to provide you with the very best care possible.   Sincerely,   Vania Rea. Jarold Motto, M.D.  [facility] [phone]

## 2010-06-08 NOTE — Progress Notes (Signed)
Summary: Refill   Phone Note Call from Patient   Summary of Call: Pt req written Rx for 90 day supply of nexium.  Mail to pt. Initial call taken by: Ashok Cordia RN,  November 30, 2009 8:16 AM  Follow-up for Phone Call        Rx mailed as requested. Follow-up by: Ashok Cordia RN,  November 30, 2009 8:19 AM    Prescriptions: NEXIUM 40 MG CPDR (ESOMEPRAZOLE MAGNESIUM) 1 capsule by mouth once a day  #90 x 3   Entered by:   Ashok Cordia RN   Authorized by:   Mardella Layman MD [facility]   Signed by:   Ashok Cordia RN on 11/30/2009   Method used:   Print then Give to Patient   RxID:   [id number]

## 2010-06-08 NOTE — Letter (Signed)
Summary: Patient Notice- Polyp Results  [facility]  [address] [place], [place] [address]   Phone: [phone]  Fax: [phone]        December 02, 2009 MRN: [id number]    [facility] [address] [place], [place]  [address]    Dear [name],  I am pleased to inform you that the colon polyp(s) removed during your recent colonoscopy was (were) found to be benign (no cancer detected) upon pathologic examination.  I recommend you have a repeat colonoscopy examination in 5_ years to look for recurrent polyps, as having colon polyps increases your risk for having recurrent polyps or even colon cancer in the future.  Should you develop new or worsening symptoms of abdominal pain, bowel habit changes or bleeding from the rectum or bowels, please schedule an evaluation with either your primary care physician or with me.  Additional information/recommendations:  __ No further action with gastroenterology is needed at this time. Please      follow-up with your primary care physician for your other healthcare      needs.  __ Please call [phone] to schedule a return visit to review your      situation.  __ Please keep your follow-up visit as already scheduled.  _x_ Continue treatment plan as outlined the day of your exam.  Please call us if you are having persistent problems or have questions about your condition that have not been fully answered at this time.  Sincerely,  Arthur Layman MD [facility]  This letter has been electronically signed by your physician.  Appended Document: Patient Notice- Polyp Results letter mailed

## 2010-06-08 NOTE — Progress Notes (Signed)
Summary: not any better  Phone Note Call from Patient Call back at [phone]   Summary of Call: Pt was seen 9/28 for bronchitis.  He was told to call back if not better.  He said he was getting better with doxy, but since finishing it he is getting worse again.  Dr Alphonsus Sias had told him he would call in quinolone if not better.  Pt [facility] [address]. Initial call taken by: Lowella Petties CMA,  February 15, 2010 9:09 AM  Follow-up for Phone Call        ok, will try avelox. Ruthe Mannan MD  February 15, 2010 10:35 AM  Spoke with patient and advised results.   Follow-up by: Mervin Hack CMA (AAMA),  February 15, 2010 11:02 AM    New/Updated Medications: AVELOX 400 MG  TABS (MOXIFLOXACIN HCL) 1 tablet by mouth daily x 5 days. Prescriptions: AVELOX 400 MG  TABS (MOXIFLOXACIN HCL) 1 tablet by mouth daily x 5 days.  #5 x 0   Entered and Authorized by:   Ruthe Mannan MD   Signed by:   Ruthe Mannan MD on 02/15/2010   Method used:   Electronically to        [facility] [address] (retail)       [address]       [place], [place]  [address]       Ph: [phone]       Fax: [phone]   RxID:   [id number]

## 2010-06-08 NOTE — Assessment & Plan Note (Signed)
Summary: sinus ear throat problems/dlo   Vital Signs:  Patient profile:   70 year old male Height:      61 inches Weight:      235.50 pounds BMI:     44.66 Temp:     98.5 degrees F oral Pulse rate:   68 / minute Pulse rhythm:   regular BP sitting:   104 / 80  (left arm) Cuff size:   large  Vitals Entered By: Linde Gillis CMA Duncan Dull) (October 13, 2009 2:15 PM) CC: sinus, ear, throat problems   History of Present Illness: Pt here for right ear ache and knot on the throat on the right side, headache in frontal area, went to acute care on Sat and was given a Zpak and told to take [facility] D and was sent home. He has suffered greatly!! Laying down at night has caused poor breathinbg, can't breathe and nasal spray given yresars ago doesn't help any more. and coughs so hard. He went to son's to babysit last night and left due to cough.  He had low grade fever to 1000 last FDri, none since, headache as above, riyght ear pain, no nasal discharge but gets discolored mucous from it (dark yellow has changed to mild yellow now) throat no longer sore but ear is, cough is productive of yellow mucous, he is SOB with no N/V since Fri. He is taking Zithro and Mucinex D. Stopped yesterday.  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 1 Capsule By Mouth Once A Day 2)  Fluticasone Propionate 50 Mcg/act Susp (Fluticasone Propionate) .... Spray 2 Spray Into Both Nostrils As Needed 3)  Pravastatin Sodium 20 Mg  Tabs (Pravastatin Sodium) .... Take 1 Tablet By Mouth Once A Day 4)  Ativan 0.5 Mg  Tabs (Lorazepam) .... 1/2-1 Tab By Mouth At Night As Needed For Anxiety. 5)  Doxazosin Mesylate 4 Mg  Tabs (Doxazosin Mesylate) .Marland Kitchen.. 1 At Bedtime 6)  Aspir-Low 81 Mg Tbec (Aspirin) .... Take One Daily  Allergies: 1)  Simvastatin (Simvastatin) 2)  Lipitor (Atorvastatin Calcium) 3)  * Pentathol  Physical Exam  General:  Well-developed,well-nourished,in no acute distress; alert,appropriate and cooperative throughout examination, not overtly congested. Head:  no maxillary or frontal tenderness Eyes:  Conjunctiva clear bilaterally.  Ears:  External ear exam shows no significant lesions or deformities.  Otoscopic examination reveals clear canals, tympanic membranes are intact bilaterally without bulging, retraction, inflammation or discharge. Hearing is grossly normal bilaterally. Wax bilaterally, discusssed irrigation. Nose:  moderate left congestion, mild on right Mouth:  no erythema and no exudates.   Neck:  supple, no masses, no thyromegaly, no carotid bruits, and no cervical lymphadenopathy.  Lungs:  ROnchi throughput worse in the bases Heart:  normal rate, regular rhythm, no murmur, and no gallop.     Impression & Recommendations:  Problem # 1:  BRONCHITIS- ACUTE (ICD-466.0) Assessment New  See instructions. His updated medication list for this problem includes:    Amoxicillin 500 Mg Caps (Amoxicillin) .Marland Kitchen... 2 tabs by mouth two times a day  Take antibiotics and other medications as directed. Encouraged to push clear liquids, get enough rest, and  take acetaminophen as needed. To be seen in 5-7 days if no improvement, sooner if worse.  Complete Medication List: 1)  Nexium 40 Mg Cpdr (Esomeprazole magnesium) .Marland Kitchen.. 1 capsule by mouth once a day 2)  Fluticasone Propionate 50 Mcg/act Susp (Fluticasone propionate) .... Spray 2 spray into both nostrils as needed 3)  Pravastatin Sodium 20 Mg Tabs (Pravastatin sodium) .... Take 1 tablet by mouth once a day 4)  Ativan 0.5 Mg Tabs (Lorazepam) .... 1/2-1 tab by mouth at night as needed for anxiety. 5)  Doxazosin Mesylate 4 Mg Tabs (Doxazosin mesylate) .Marland Kitchen.. 1 at bedtime 6)  Aspir-low 81 Mg Tbec (Aspirin) .... Take one daily 7)  Amoxicillin 500 Mg Caps (Amoxicillin) .... 2 tabs by mouth two times a day  Patient Instructions: 1)  Add Amox 2)  Take Guaifenesin by [facility] and getting MUCOUS RELIEF EXPECTORANT (400mg ), take 11/2 tabs by mouth AM and NOON. 3)  Drink lots of fluids anytime taking Guaifenesin.  4)  PPP&D 5)  Lozenge as needed. Prescriptions: AMOXICILLIN 500 MG CAPS (AMOXICILLIN) 2 tabs by mouth two times a day  #40 x 0   Entered and Authorized by:   Shaune Leeks MD   Signed by:   Shaune Leeks MD on 10/13/2009   Method used:   Electronically to        [facility] [address] [phone]* (retail)       [address] [place]  [address]       Ph: [phone]       Fax: [phone]   RxID:   [id number]   Current Allergies (reviewed today): SIMVASTATIN (SIMVASTATIN) LIPITOR (ATORVASTATIN CALCIUM) * PENTATHOL

## 2010-06-08 NOTE — Procedures (Signed)
Summary: Upper Endoscopy  Patient: Oral Remache Note: All result statuses are Final unless otherwise noted.  Tests: (1) Upper Endoscopy (EGD)   EGD Upper Endoscopy       DONE     [facility]     [address]     [place], [place]  [address]           ENDOSCOPY PROCEDURE REPORT           PATIENT:  [name], [name]  MR#:  [id number]     BIRTHDATE:  03/30/1941, 69 yrs. old  GENDER:  male           ENDOSCOPIST:  Vania Rea. Jarold Motto, MD, [facility]     Referred by:           PROCEDURE DATE:  11/27/2009     PROCEDURE:  EGD with biopsy     ASA CLASS:  Class II     INDICATIONS:  h/o Barrett's Esophagus           MEDICATIONS:   There was residual sedation effect present from     prior procedure., Fentanyl 25 mcg IV, Versed 1 mg IV     TOPICAL ANESTHETIC:           DESCRIPTION OF PROCEDURE:   After the risks benefits and     alternatives of the procedure were thoroughly explained, informed     consent was obtained.  [facility] [id number] endoscope was     introduced through the mouth and advanced to the second portion of     the duodenum, limited by retching and gagging.   The instrument     was slowly withdrawn as the mucosa was fully examined.     <<PROCEDUREIMAGES>>           A hiatal hernia was found. 5 CM. HH NOTED  Normal duodenal folds     were noted.  The stomach was entered and closely examined. The     antrum, angularis, and lesser curvature were well visualized,     including a retroflexed view of the cardia and fundus. The stomach     wall was normally distensable. The scope passed easily through the     pylorus into the duodenum.  irregular Z-line. SHORT SEGMENT     BARRETT'S BIOPSIED.NO INFLAMMATION.  The esophagus and     gastroesophageal junction were completely normal in appearance.     Retroflexed views revealed a hiatal hernia.    The scope was then     withdrawn from the patient and the procedure completed.           COMPLICATIONS:  None        ENDOSCOPIC IMPRESSION:     1) Hiatal hernia     2) Normal duodenal folds     3) Normal stomach     4) Irregular Z-line     5) Normal esophagus     6) A hiatal hernia     CHRONIC GERD.SHORT SEGMENT BARRETT'S.R/O DYSPLASIA.     RECOMMENDATIONS:     1) Await biopsy results     2) REPEAT SURVEILLANCE EGD IN 3 YEARS     3) continue PPI           REPEAT EXAM:  No           ______________________________     Vania Rea. Jarold Motto, MD, [facility]           CC:  Karie Schwalbe, MD  n.     eSIGNED:   Vania Rea. Astoria Condon at 11/27/2009 02:58 PM           Melodie Bouillon, [id number]  Note: An exclamation mark (!) indicates a result that was not dispersed into the flowsheet. Document Creation Date: 11/27/2009 2:59 PM _______________________________________________________________________  (1) Order result status: Final Collection or observation date-time: 11/27/2009 14:53 Requested date-time:  Receipt date-time:  Reported date-time:  Referring Physician:   Ordering Physician: Sheryn Bison [id number]) Specimen Source:  Source: Launa Grill Order Number: [id number] Lab site:   Appended Document: Upper Endoscopy     Procedures Next Due Date:    EGD: 11/2012

## 2010-06-08 NOTE — Miscellaneous (Signed)
Summary: Lec previsit  Clinical Lists Changes  Medications: Added new medication of MOVIPREP 100 GM  SOLR (PEG-KCL-NACL-NASULF-NA ASC-C) As per prep instructions. - Signed Rx of MOVIPREP 100 GM  SOLR (PEG-KCL-NACL-NASULF-NA ASC-C) As per prep instructions.;  #1 x 0;  Signed;  Entered by: Ulis Rias RN;  Authorized by: Mardella Layman MD [facility];  Method used: Electronically to [facility] [address], [place], [place], [place]  [address], Ph: [phone], Fax: [phone] Observations: Added new observation of ALLERGY REV: Done (11/20/2009 8:51)    Prescriptions: MOVIPREP 100 GM  SOLR (PEG-KCL-NACL-NASULF-NA ASC-C) As per prep instructions.  #1 x 0   Entered by:   Ulis Rias RN   Authorized by:   Mardella Layman MD [facility]   Signed by:   Ulis Rias RN on 11/20/2009   Method used:   Electronically to        [facility] [address] (retail)       [address]       [place], [place]  [address]       Ph: [phone]       Fax: [phone]   RxID:   [id number]

## 2010-06-08 NOTE — Assessment & Plan Note (Signed)
Summary: 3-4 MONTH FOLLOW UP/RBH   Vital Signs:  Patient profile:   70 year old male Weight:      237 pounds Temp:     98.3 degrees F oral Pulse rate:   72 / minute Pulse rhythm:   regular BP sitting:   120 / 68  (left arm) Cuff size:   large  Vitals Entered By: Mervin Hack CMA Duncan Dull) (July 10, 2009 8:00 AM) CC: 3-4 month follow-up   History of Present Illness: Tried to pick up long heavy box over Christmas tore left rotator cuff and had surgery  Back is doing well Knee is doing poorly Hopes to have TKR soon has gotten cortisone shot but not much help  No heart troubles No chest pain Has been limited in exercise due to rotator cuff No SOB  Several prostate biopsies Due to go back to Dr Annabell Howells Voids okay on doxazosin Nocturia only x 1  Stomach quiet stays on nexium daily  Allergies: 1)  Simvastatin (Simvastatin) 2)  Lipitor (Atorvastatin Calcium) 3)  * Pentathol  Past History:  Past medical, surgical, family and social histories (including risk factors) reviewed for relevance to current acute and chronic problems.  Past Medical History: Allergic rhinitis Diverticulosis, colon GERD--wit reflux esophagitis and Barretts---------------------Dr Jarold Motto Hyperlipidemia Spinal stenosis Benign prostatic hypertrophy Anxiety Hypertension Osteoarthritis  Past Surgical History: Kidney stones--multiple times Left knee tendon repair--1966 Vasectomy/spermatocele/?testicular cyst--1970 R rotator cuff repair--1/00  Thurston Hole) L eye muscle surgery--2/01 Stress test negative--5/04 EGD/colon--6/04 EGD--Barrett's--7/05 EGD--Barrett's but no cancer--4/08 709 Arthroscopy left knee--Dr Thurston Hole L4-5 repair--  8/10    -------------Dr Rise Mu ([facility]) Left rotator cuff repeair-- 06/2009   Dr Thurston Hole  Family History: Reviewed history from 12/04/2008 and no changes required. Dad died of lung cancer @87  Mom died of heart trouble @87 .  Had HTN 2 brothers No other CAD or  HTN No DM Mom had breast cancer No prostate or colon cancer  Social History: Reviewed history from 12/04/2008 and no changes required. Retired from various businesses Married--3 children Never Smoked Alcohol use-no Illicit Drug Use - no  Review of Systems       appetite is okay---has really been watching his diet weight down 4# sleeps fine  Physical Exam  General:  alert and normal appearance.   Neck:  supple, no masses, no thyromegaly, no carotid bruits, and no cervical lymphadenopathy.   Lungs:  normal respiratory effort and normal breath sounds.   Heart:  normal rate, regular rhythm, no murmur, and no gallop.   Abdomen:  soft and non-tender.   Extremities:  no sig edema Psych:  normally interactive, good eye contact, not anxious appearing, and not depressed appearing.     Impression & Recommendations:  Problem # 1:  HYPERTENSION (ICD-401.9) Assessment Unchanged  BP fine without specific meds  His updated medication list for this problem includes:    Doxazosin Mesylate 4 Mg Tabs (Doxazosin mesylate) .Marland Kitchen... 1 at bedtime  BP today: 120/68 Prior BP: 110/70 (04/07/2009)  Labs Reviewed: K+: 3.7 (12/25/2008) Creat: : 1.1 (12/25/2008)   Chol: 202 (10/09/2006)   HDL: 35.8 (10/09/2006)   LDL: DEL (10/09/2006)   TG: 143 (10/09/2006)  Orders: TLB-Renal Function Panel (80069-RENAL) TLB-CBC Platelet - w/Differential (85025-CBCD) TLB-TSH (Thyroid Stimulating Hormone) (84443-TSH)  Problem # 2:  HYPERLIPIDEMIA (ICD-272.4) Assessment: Unchanged  no problems on meds due for labs  His updated medication list for this problem includes:    Pravastatin Sodium 20 Mg Tabs (Pravastatin sodium) .Marland Kitchen... Take 1 tablet by mouth once a  day  Labs Reviewed: SGOT: 20 (08/04/2008)   SGPT: 25 (08/04/2008)   HDL:35.8 (10/09/2006)  LDL:DEL (10/09/2006)  Chol:202 (10/09/2006)  Trig:143 (10/09/2006)  Orders: TLB-Lipid Panel (80061-LIPID) TLB-Hepatic/Liver Function Pnl  (80076-HEPATIC) Venipuncture ([address])  Problem # 3:  GERD (ICD-530.81) Assessment: Unchanged symptoms controlled on med needs to continue  His updated medication list for this problem includes:    Nexium 40 Mg Cpdr (Esomeprazole magnesium) .Marland Kitchen... 1 capsule by mouth once a day  Problem # 4:  BENIGN PROSTATIC HYPERTROPHY (ICD-600.00) Assessment: Unchanged doing well on med  Problem # 5:  OSTEOARTHRITIS (ICD-715.90) Assessment: Comment Only needs TKR now  Complete Medication List: 1)  Nexium 40 Mg Cpdr (Esomeprazole magnesium) .Marland Kitchen.. 1 capsule by mouth once a day 2)  Fluticasone Propionate 50 Mcg/act Susp (Fluticasone propionate) .... Spray 2 spray into both nostrils as needed 3)  Pravastatin Sodium 20 Mg Tabs (Pravastatin sodium) .... Take 1 tablet by mouth once a day 4)  Ativan 0.5 Mg Tabs (Lorazepam) .... 1/2-1 tab by mouth at night as needed for anxiety. 5)  Doxazosin Mesylate 4 Mg Tabs (Doxazosin mesylate) .Marland Kitchen.. 1 at bedtime 6)  Aspir-low 81 Mg Tbec (Aspirin) .... Take one daily  Patient Instructions: 1)  Please schedule a follow-up appointment in 6 months .   Current Allergies (reviewed today): SIMVASTATIN (SIMVASTATIN) LIPITOR (ATORVASTATIN CALCIUM) * PENTATHOL

## 2010-06-08 NOTE — Letter (Signed)
Summary: [facility]  [facility]   Imported By: Lanelle Bal 09/01/2009 08:46:10  _____________________________________________________________________  External Attachment:    Type:   Image     Comment:   External Document  Appended Document: [facility] slow increase in PSA starting finasteride  3 month follow up --will consider repeat biopsy

## 2010-06-08 NOTE — Letter (Signed)
Summary: [facility]   Imported By: Roderic Ovens 06/05/2009 11:16:42  _____________________________________________________________________  External Attachment:    Type:   Image     Comment:   External Document

## 2010-06-08 NOTE — Progress Notes (Signed)
Summary: Sinus infection  Phone Note Call from Patient Call back at [phone]   Caller: Patient Call For: [name] Summary of Call: pt calling upset because he can't get an appt with Dr. Alphonsus Sias, pt was seen by Dr. Hetty Ely for sinus infection and doesn't feel the medication is helping. I advised pt that Amoxicillin is the same med Dr. Alphonsus Sias gave him. Pt is complaining of drainage and a cough that won't quit. I advised pt that we are trying to fit everyone in that calls for an appt. I also advised that we are getting 2 new physicans that would help with seeing more pt's. He stated he doesn't want to seen any other dr. except Dr. Alphonsus Sias, because he is the only one that can cure his sinus infections. Please advise if he can try another mediation. Initial call taken by: Mervin Hack CMA Duncan Dull),  November 03, 2009 1:20 PM  Follow-up for Phone Call        okay to add on at end of morning tomorrow but I am okay with trying augmentin 875 two times a day  (#20 x 0) if he just wants to try another medicine first (since he has already been seen for this) Then if he is not better within 3-4 days, he will need a reevaluation Follow-up by: [name],  November 03, 2009 1:54 PM  Additional Follow-up for Phone Call Additional follow up Details #1::        Left message on cell phone for patient to return call.  DeShannon Smith CMA Duncan Dull)  November 03, 2009 2:28 PM   spoke with patient and he wanted to try the Augmentin, he also used the [facility] and stated that he wasn't coughing today. Pt states the Flonase has helped alot and with the augmentin and flonase he should make it. Pt stated he would call next week if he's not better. DeShannon Katrinka Blazing CMA Duncan Dull)  November 04, 2009 12:42 PM     New/Updated Medications: AMOXICILLIN-POT CLAVULANATE 875-125 MG TABS (AMOXICILLIN-POT CLAVULANATE) take 1 by mouth two times a day Prescriptions: AMOXICILLIN-POT CLAVULANATE 875-125 MG TABS (AMOXICILLIN-POT  CLAVULANATE) take 1 by mouth two times a day  #20 x 0   Entered by:   Mervin Hack CMA (AAMA)   Authorized by:   [name]   Signed by:   Mervin Hack CMA (AAMA) on 11/04/2009   Method used:   Electronically to        [facility] [address] (retail)       [address]       [place], [place]  [address]       Ph: [phone]       Fax: [phone]   RxID:   [id number] FLUTICASONE PROPIONATE 50 MCG/ACT SUSP (FLUTICASONE PROPIONATE) Spray 2 spray into both nostrils as needed  #3 x 3   Entered by:   Mervin Hack CMA (AAMA)   Authorized by:   [name]   Signed by:   Mervin Hack CMA (AAMA) on 11/04/2009   Method used:   Electronically to        [facility] [address] (retail)       [address]       [place], [place]  [address]       Ph: [phone]       Fax: [phone]  RxID:   [id number]

## 2010-06-08 NOTE — Assessment & Plan Note (Signed)
Summary: ROA FOR 6 MONTH FOLLOW-UP/JRR   Vital Signs:  Patient profile:   70 year old male Height:      71 inches Weight:      236 pounds BMI:     33.03 Temp:     98.1 degrees F oral Pulse rate:   72 / minute Pulse rhythm:   regular BP sitting:   122 / 70  (left arm) Cuff size:   large  Vitals Entered By: Mervin Hack CMA Duncan Dull) (December 18, 2009 8:03 AM)  Nutrition Counseling: Patient's BMI is greater than 25 and therefore counseled on weight management options. CC: 6  month follow-up   History of Present Illness: Seems to be coming back to normal Shoulder is better, back has recuperated Knee is still bad--planning left TKR in January Trying to strenthen leg prior to surgery Uses the mobic with reasonable effect  Has urology check up coming up with Dr Annabell Howells Hopefully won't need more biopsies Voids fine Nocturia x 1  No chest pain No palpitations  No SOB  Uses the lorazepam occ has run out and needs refill No depression  Reflux has been controlled recent EGD shows Barretts but no cancer  Preventive Screening-Counseling & Management  Alcohol-Tobacco     Smoking Status: never  Allergies: 1)  Simvastatin (Simvastatin) 2)  Lipitor (Atorvastatin Calcium) 3)  * Pentathol  Past History:  Past medical, surgical, family and social histories (including risk factors) reviewed for relevance to current acute and chronic problems.  Past Medical History: Reviewed history from 07/10/2009 and no changes required. Allergic rhinitis Diverticulosis, colon GERD--wit reflux esophagitis and Barretts---------------------Dr Jarold Motto Hyperlipidemia Spinal stenosis Benign prostatic hypertrophy Anxiety Hypertension Osteoarthritis  Past Surgical History: Reviewed history from 07/10/2009 and no changes required. Kidney stones--multiple times Left knee tendon repair--1966 Vasectomy/spermatocele/?testicular cyst--1970 R rotator cuff repair--1/00  Thurston Hole) L eye muscle  surgery--2/01 Stress test negative--5/04 EGD/colon--6/04 EGD--Barrett's--7/05 EGD--Barrett's but no cancer--4/08 709 Arthroscopy left knee--Dr Thurston Hole L4-5 repair--  8/10    -------------Dr Rise Mu ([facility]) Left rotator cuff repeair-- 06/2009   Dr Thurston Hole  Family History: Reviewed history from 12/04/2008 and no changes required. Dad died of lung cancer @87  Mom died of heart trouble @87 .  Had HTN 2 brothers No other CAD or HTN No DM Mom had breast cancer No prostate or colon cancer  Social History: Reviewed history from 12/04/2008 and no changes required. 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   Neck:  supple, no masses, no thyromegaly, no carotid bruits, and no cervical lymphadenopathy.   Lungs:  normal respiratory effort, no intercostal retractions, no accessory muscle use, and normal breath sounds.   Heart:  normal rate, regular rhythm, no murmur, and no gallop.   Abdomen:  soft, non-tender, and no masses.   Extremities:  no edema Psych:  normally interactive, good eye contact, not anxious appearing, and not depressed appearing.     Impression & Recommendations:  Problem # 1:  HYPERTENSION (ICD-401.9) Assessment Unchanged good control no changes needed  His updated medication list for this problem includes:    Doxazosin Mesylate 4 Mg Tabs (Doxazosin mesylate) .Marland Kitchen... 1 at bedtime  BP today: 122/70 Prior BP: 104/80 (10/13/2009)  Labs Reviewed: K+: 4.0 (07/10/2009) Creat: : 0.9 (07/10/2009)   Chol: 173 (07/10/2009)   HDL: 39.90 (07/10/2009)  LDL: 119 (07/10/2009)   TG: 72.0 (07/10/2009)  Problem # 2:  HYPERLIPIDEMIA (ICD-272.4) Assessment: Unchanged reasonable control will have him work on lifestyle --discussed  His updated medication list for this problem includes:     Pravastatin Sodium 20 Mg Tabs (Pravastatin sodium) .Marland Kitchen... Take 1 tablet by mouth once a day  Labs Reviewed: SGOT: 20 (07/10/2009)   SGPT: 18 (07/10/2009)   HDL:39.90 (07/10/2009), 35.8 (10/09/2006)  LDL:119 (07/10/2009), DEL (10/09/2006)  Chol:173 (07/10/2009), 202 (10/09/2006)  Trig:72.0 (07/10/2009), 143 (10/09/2006)  Problem # 3:  GERD (ICD-530.81) Assessment: Unchanged doing well  His updated medication list for this problem includes:    Nexium 40 Mg Cpdr (Esomeprazole magnesium) .Marland Kitchen... 1 capsule by mouth once a day  Problem # 4:  OSTEOARTHRITIS (ICD-715.90) Assessment: Unchanged better with shoulder and back Now needs to deal with knee  His updated medication list for this problem includes:    Mobic 15 Mg Tabs (Meloxicam) .Marland Kitchen... Take 1 by mouth once daily as needed    Aspir-low 81 Mg Tbec (Aspirin) .Marland Kitchen... Take one daily  Complete Medication List: 1)  Nexium 40 Mg Cpdr (Esomeprazole magnesium) .Marland Kitchen.. 1 capsule by mouth once a day 2)  Fluticasone Propionate 50 Mcg/act Susp (Fluticasone propionate) .... Spray 2 spray into both nostrils as needed 3)  Pravastatin Sodium 20 Mg Tabs (Pravastatin sodium) .... Take 1 tablet by mouth once a day 4)  Ativan 0.5 Mg Tabs (Lorazepam) .... 1/2-1 tab by mouth at night as needed for anxiety. 5)  Doxazosin Mesylate 4 Mg Tabs (Doxazosin mesylate) .Marland Kitchen.. 1 at bedtime 6)  Mobic 15 Mg Tabs (Meloxicam) .... Take 1 by mouth once daily as needed 7)  Finasteride 5 Mg Tabs (Finasteride) .... Take 1 by mouth once daily 8)  Aspir-low 81 Mg Tbec (Aspirin) .... Take one daily  Patient Instructions: 1)  Please schedule a follow-up appointment in 6 months for physical Prescriptions: ATIVAN 0.5 MG  TABS (LORAZEPAM) 1/2-1 tab by mouth at night as needed for anxiety.  #90 x 0   Entered and Authorized by:   Cindee Salt MD   Signed by:   Cindee Salt MD on 12/18/2009   Method used:   Print then Give to Patient   RxID:   [id number]   Current  Allergies (reviewed today): SIMVASTATIN (SIMVASTATIN) LIPITOR (ATORVASTATIN CALCIUM) * PENTATHOL

## 2010-06-08 NOTE — Assessment & Plan Note (Signed)
Summary: 8:15 COUGH,DRAINAGE/CLE   Vital Signs:  Patient profile:   70 year old male Weight:      237 pounds Temp:     98.2 degrees F oral Pulse rate:   78 / minute Pulse rhythm:   regular Resp:     14 per minute BP sitting:   122 / 60  (left arm) Cuff size:   large  Vitals Entered By: Mervin Hack CMA Duncan Dull) (February 03, 2010 8:07 AM) CC: cough   History of Present Illness: Has developed some wheezing in the past couple of weeks Has drainage day and night causing cough and some mucus--clear at this point with occ yellow in the AM getting clogged nose  No sore throat slight ear pain on right  No SOB except for stable DOE no fever  Allergies: 1)  Simvastatin (Simvastatin) 2)  Lipitor (Atorvastatin Calcium) 3)  * Pentathol  Past History:  Past medical, surgical, family and social histories (including risk factors) reviewed for relevance to current acute and chronic problems.  Past Medical History: Reviewed history from 07/10/2009 and no changes required. Allergic rhinitis Diverticulosis, colon GERD--wit reflux esophagitis and Barretts---------------------Dr Jarold Motto Hyperlipidemia Spinal stenosis Benign prostatic hypertrophy Anxiety Hypertension Osteoarthritis  Past Surgical History: Reviewed history from 07/10/2009 and no changes required. Kidney stones--multiple times Left knee tendon repair--1966 Vasectomy/spermatocele/?testicular cyst--1970 R rotator cuff repair--1/00  Thurston Hole) L eye muscle surgery--2/01 Stress test negative--5/04 EGD/colon--6/04 EGD--Barrett's--7/05 EGD--Barrett's but no cancer--4/08 709 Arthroscopy left knee--Dr Thurston Hole L4-5 repair--  8/10    -------------Dr Rise Mu ([facility]) Left rotator cuff repeair-- 06/2009   Dr Thurston Hole  Family History: Reviewed history from 12/04/2008 and no changes required. Dad died of lung cancer @87  Mom died of heart trouble @87 .  Had HTN 2 brothers No other CAD or HTN No DM Mom had breast  cancer No prostate or colon cancer  Social History: Reviewed history from 12/04/2008 and no changes required. 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   Head:  No maxillary or frontal tenderness Ears:  R ear normal.  Left TM obscurred with cerumen Nose:  moderate mostly pale nasal congestion Mouth:  no erythema, no exudates, and no lesions.   Neck:  supple, no masses, and no thyromegaly.   Lungs:  normal respiratory effort, no intercostal retractions, and no accessory muscle use.  Decreased breath sounds at right base Mild exp phase prolongation and slight exp wheezing   Impression & Recommendations:  Problem # 1:  BRONCHITIS- ACUTE (ICD-466.0) Assessment New  PE findings due to elevated hemidiaphragm Mild wheezing may indicate atypical infection hasn't had good response from zpak in past  will try doxy now change to quinolone if not improving  His updated medication list for this problem includes:    Doxycycline Hyclate 100 Mg Caps (Doxycycline hyclate) .Marland Kitchen... 1 tab by mouth two times a day for bronchial infection  Complete Medication List: 1)  Nexium 40 Mg Cpdr (Esomeprazole magnesium) .Marland Kitchen.. 1 capsule by mouth once a day 2)  Fluticasone Propionate 50 Mcg/act Susp (Fluticasone propionate) .... Spray 2 spray into both nostrils as needed 3)  Pravastatin Sodium 20 Mg Tabs (Pravastatin sodium) .... Take 1 tablet by mouth once a day 4)  Ativan 0.5 Mg Tabs (Lorazepam) .... 1/2-1 tab by mouth at  night as needed for anxiety. 5)  Doxazosin Mesylate 4 Mg Tabs (Doxazosin mesylate) .Marland Kitchen.. 1 at bedtime 6)  Mobic 15 Mg Tabs (Meloxicam) .... Take 1 by mouth once daily as needed 7)  Finasteride 5 Mg Tabs (Finasteride) .... Take 1 by mouth once daily 8)  Aspir-low  81 Mg Tbec (Aspirin) .... Take one daily 9)  Doxycycline Hyclate 100 Mg Caps (Doxycycline hyclate) .Marland Kitchen.. 1 tab by mouth two times a day for bronchial infection 10)  Tramadol Hcl 50 Mg Tabs (Tramadol hcl) .Marland Kitchen.. 1 tab by mouth three times a day as needed for cough  Other Orders: T-2 View CXR (71020TC)  Patient Instructions: 1)  Please schedule a follow-up appointment as needed .  Prescriptions: TRAMADOL HCL 50 MG TABS (TRAMADOL HCL) 1 tab by mouth three times a day as needed for cough  #30 x 0   Entered and Authorized by:   Cindee Salt MD   Signed by:   Cindee Salt MD on 02/03/2010   Method used:   Electronically to        [facility] [address] (retail)       [address]       [place], [place]  [address]       Ph: [phone]       Fax: [phone]   RxID:   [id number] DOXYCYCLINE HYCLATE 100 MG CAPS (DOXYCYCLINE HYCLATE) 1 tab by mouth two times a day for bronchial infection  #20 x 0   Entered and Authorized by:   Cindee Salt MD   Signed by:   Cindee Salt MD on 02/03/2010   Method used:   Electronically to        [facility] [address] (retail)       [address]       [place], [place]  [address]       Ph: [phone]       Fax: [phone]   RxID:   [id number]   Current Allergies (reviewed today): SIMVASTATIN (SIMVASTATIN) LIPITOR (ATORVASTATIN CALCIUM) * PENTATHOL

## 2010-06-08 NOTE — Progress Notes (Signed)
Summary: Schedule ECL   Phone Note Call from Patient Call back at Home Phone [phone]   Call For: Dr Jarold Motto Summary of Call: Received a letter telling him to schedule an Endo but he likes to do both procedures at the same time. Would like to schedule Endo Colon. Would that be ok to go directly or will he need an office visit? Initial call taken by: Leanor Kail [facility],  November 05, 2009 1:47 PM  Follow-up for Phone Call        See last colon from 08/21/06 .  Patient  states he wants to add a colon to his recall EGD due to family hx of colon polyps.  He has no c/ change in bowel habits, constipation, or rectal bleeding.  Please advise if ok to schedule a double. Follow-up by: Darcey Nora RN, CGRN,  November 06, 2009 1:54 PM  Additional Follow-up for Phone Call Additional follow up Details #1::        OK Additional Follow-up by: Mardella Layman MD Terrilee Files 3:27 PM    Additional Follow-up for Phone Call Additional follow up Details #2::    ECL scheduled for 11/27/09, pre-visit 11/20/09  Follow-up by: Darcey Nora RN, CGRN,  November 06, 2009 3:59 PM

## 2010-06-08 NOTE — Letter (Signed)
Summary: [facility]  [facility]   Imported By: Sherian Rein 04/13/2010 11:07:29  _____________________________________________________________________  External Attachment:    Type:   Image     Comment:   External Document  Appended Document: [facility] left olecranon bursa drained and injected again Plans TKR soon

## 2010-06-08 NOTE — Assessment & Plan Note (Signed)
Summary: f/u on lungs/dlo   Vital Signs:  Patient profile:   70 year old male Weight:      246 pounds O2 Sat:      96 % on Room air Temp:     98.5 degrees F oral Pulse rate:   72 / minute Pulse rhythm:   regular Resp:     16 per minute BP sitting:   142 / 90  (left arm) Cuff size:   large  Vitals Entered By: Mervin Hack CMA Duncan Dull) (March 04, 2010 2:14 PM)  O2 Flow:  Room air CC: follow-up visit   History of Present Illness: Still wheezing Has finished 2 rounds of antibiotics doxy helped but didn't clear him Really got knocked down by the avelox---gave him sores in his mouth Still with some cough and clear mucus  Wheezing with deep breath Gets exhaused walking up stairs tired at night--going to sleep early since sick No history of asthma  No fever  Allergies: 1)  Simvastatin (Simvastatin) 2)  Lipitor (Atorvastatin Calcium) 3)  * Pentathol  Past History:  Past medical, surgical, family and social histories (including risk factors) reviewed for relevance to current acute and chronic problems.  Past Medical History: Reviewed history from 07/10/2009 and no changes required. Allergic rhinitis Diverticulosis, colon GERD--wit reflux esophagitis and Barretts---------------------Dr Jarold Motto Hyperlipidemia Spinal stenosis Benign prostatic hypertrophy Anxiety Hypertension Osteoarthritis  Past Surgical History: Reviewed history from 07/10/2009 and no changes required. Kidney stones--multiple times Left knee tendon repair--1966 Vasectomy/spermatocele/?testicular cyst--1970 R rotator cuff repair--1/00  Thurston Hole) L eye muscle surgery--2/01 Stress test negative--5/04 EGD/colon--6/04 EGD--Barrett's--7/05 EGD--Barrett's but no cancer--4/08 709 Arthroscopy left knee--Dr Thurston Hole L4-5 repair--  8/10    -------------Dr Rise Mu ([facility]) Left rotator cuff repeair-- 06/2009   Dr Thurston Hole  Family History: Reviewed history from 12/04/2008 and no changes required. Dad died  of lung cancer @87  Mom died of heart trouble @87 .  Had HTN 2 brothers No other CAD or HTN No DM Mom had breast cancer No prostate or colon cancer  Social History: Reviewed history from 12/04/2008 and no changes required. Retired from various businesses Married--3 children Never Smoked Alcohol use-no Illicit Drug Use - no  Review of Systems       weight is up here---on retreat last 3 days and overate, also has change and other weighty objects in his pockets sleeps okay  Physical Exam  General:  alert and normal appearance.   Neck:  supple, no masses, and no cervical lymphadenopathy.   Lungs:  normal respiratory effort, no intercostal retractions, no accessory muscle use, no crackles, and no wheezes.  Same decreased breath sounds at right base Heart:  normal rate, regular rhythm, no murmur, and no gallop.   Extremities:  trace edema in ankles and distal calves Psych:  normally interactive, good eye contact, not anxious appearing, and not depressed appearing.     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 Spray 2 spray into both nostrils as needed 2)  Pravastatin Sodium 20 Mg Tabs (Pravastatin sodium) .... Take 1 tablet by mouth once a day 3)  Ativan 0.5 Mg Tabs (Lorazepam) .... 1/2-1 tab by mouth at night as needed for anxiety. 4)  Doxazosin Mesylate 4 Mg Tabs (  Doxazosin mesylate) .Marland Kitchen.. 1 at bedtime 5)  Mobic 15 Mg Tabs (Meloxicam) .... Take 1 by mouth once daily as needed 6)  Finasteride 5 Mg Tabs (Finasteride) .... Take 1 by mouth once daily 7)  Tramadol Hcl 50 Mg Tabs (Tramadol hcl) .Marland Kitchen.. 1 tab by mouth three  times a day as needed for cough 8)  Nystatin 100000 Unit/ml Susp (Nystatin) .... 5cc swish and spit four times daily till clear 9)  Aspir-low 81 Mg Tbec (Aspirin) .... Take one daily 10)  Nexium 40 Mg Cpdr (Esomeprazole magnesium) .Marland Kitchen.. 1 capsule by mouth once a day 11)  Prednisone 20 Mg Tabs (Prednisone) .... 2 tabs daily for 5 days, then 1 tab daily for 5 days  Patient Instructions: 1)  Please schedule a follow-up appointment in 2 months.  Prescriptions: PREDNISONE 20 MG TABS (PREDNISONE) 2 tabs daily for 5 days, then 1 tab daily for 5 days  #15 x 0   Entered and Authorized by:   Cindee Salt MD   Signed by:   Cindee Salt MD on 03/04/2010   Method used:   Electronically to        [facility] [address] (retail)       [address]       [place], [place]  [address]       Ph: [phone]       Fax: [phone]   RxID:   [id number]    Orders Added: 1)  Est. Patient Level III [40102] 2)  Spirometry w/Graph [72536]    Current Allergies (reviewed today): SIMVASTATIN (SIMVASTATIN) LIPITOR (ATORVASTATIN CALCIUM) * PENTATHOL

## 2010-06-08 NOTE — Letter (Signed)
Summary: [facility]  [facility]   Imported By: Lanelle Bal 02/10/2010 12:55:14  _____________________________________________________________________  External Attachment:    Type:   Image     Comment:   External Document

## 2010-06-09 ENCOUNTER — Encounter: Payer: Self-pay | Admitting: Internal Medicine

## 2010-06-09 NOTE — Op Note (Signed)
Arthur, Johnston NO.:  [id number]  MEDICAL RECORD NO.:  [id number]          PATIENT TYPE:  INP  LOCATION:  5003                         FACILITY:  [facility]  PHYSICIAN:  Makani Seckman A. Thurston Hole, M.D. DATE OF BIRTH:  01/09/1941  DATE OF PROCEDURE:  05/31/2010 DATE OF DISCHARGE:                              OPERATIVE REPORT   PREOPERATIVE DIAGNOSIS:  Left knee degenerative joint disease.  POSTOPERATIVE DIAGNOSIS:  Left knee degenerative joint disease.  PROCEDURES: 1. Left total knee replacement using DePuy cemented total knee system     with #5 cemented femur, #5 cemented tibia with 12.5-mm polyethylene     RP tibial spacer and 35-mm polyethylene cemented patella. 2. Zinacef impregnated cement.  SURGEON:  Elana Alm. Thurston Hole, MD  ASSISTANT:  Julien Girt, PA-C  ANESTHESIA:  General.  OPERATIVE TIME:  1 hour and 40 minutes.  COMPLICATIONS:  None.  DESCRIPTION OF PROCEDURE:  [name] was brought to the operating room on May 31, 2010, after a femoral nerve block and then placed in holding by Anesthesia.  He was placed on the operative table in supine position.  He received Ancef 1 g IV preoperatively for prophylaxis. After being placed under general anesthesia, had a Foley catheter placed under sterile conditions.  His left knee was examined.  Range of motion from -5 to 125 degrees moderate varus deformity knee stable ligamentous exam with normal patellar tracking.  Left leg was prepped using sterile DuraPrep and draped using sterile technique.  Time-out procedure was called and the correct left knee identified.  At this point, the leg was exsanguinated and a thigh tourniquet elevated 365-mm.  Initially, through a 15-cm anterior incision based over the patella, initial exposure was made.  The underlying subcutaneous tissues were incised along with skin incision.  A median arthrotomy was performed revealing an excessive amount of normal-appearing  joint fluid.  The articular surfaces were inspected.  He had grade 4 changes medially, laterally and in the patellofemoral joint.  He had a very large calcified extra bony piece on the lateral aspect of his patella measuring 2 x 2 cm which was removed.  Osteophytes of the femoral condyles and tibial plateau were removed as well as the medial and lateral meniscal remnants.  Initially, through an intramedullary drill drilled up the femoral canal.  The distal femoral cutting jig was placed in the appropriate manner rotation and distal 10-mm cut was made.  The distal femur was incised.  A #5 was found to be appropriate size.  A #5 cutting jig was placed in the appropriate manner of external rotation and then these cuts were made. Proximal tibia was then exposed.  The tibial spines were removed with an oscillating saw.  Intramedullary drill was drilled down the tibial canal for placement of the proximal tibial cutting jig which was placed in the appropriate manner of rotation and a proximal 4-mm cut was made based off the medial or lower side.  Spacer blocks were then placed in flexion and extension.  A 12.5- mm blocks gave excellent balancing, excellent stability and excellent correction of his flexion and varus deformities. At this  point, the #5 tibial baseplate trial was placed on the cut tibial surface with a excellent fit and then the keel cut was made.  The PCL box cutter was then placed on the distal femur and these cuts were made.  At this point, the #5 femoral trial was placed with a #5 tibial baseplate trial and 12.5-mm polyethylene RP tibial spacer.  Knee was reduced, taken through a range of motion from 0-125 degrees with excellent stability and excellent correction of his flexion and varus deformities and normal patellar tracking.  At this point, a resurfacing 9-mm cut was made on the patella and 3 locking holes placed for a 35-mm patellar trial which was then placed and again  patellofemoral tracking was evaluated and found to be normal.  At this point, it was felt that all the trial components were excellent size and stability.  They were then removed.  The knee was then jet lavage irrigated with 3 L of saline.  The proximal tibia was then exposed and the #5 tibial baseplate with Zinacef impregnated cement backing was hammered into position with an excellent fit with excess cement being removed from around the edges. A #5 femoral component with cement backing was hammered into position also with an excellent fit, with excess cement being removed from around the edges.  The 12.5-mm polyethylene RP tibial spacer was placed on tibial baseplate.  The knee reduced, taken through range of motion from 0-125 degrees with excellent stability and excellent correction of his flexion and varus deformities.  The 35-mm polyethylene cement backed patella was then placed in its position and held there with a clamp. After the cement hardened, again patellofemoral tracking was evaluated and found to be normal.  At this point, it was felt that all the components were of excellent size and stability.  The knee was further irrigated with saline and then the tourniquet was released.  Hemostasis obtained with cautery.  The arthrotomy was then closed with #1 Ethibond suture.  Subcutaneous tissues closed with 0 and 2-0 Vicryl. Subcuticular layer closed with 4-0 Monocryl.  Sterile dressing and a long-leg splint applied.  The patient then awakened, extubated and taken to recovery room in stable condition.  Needle and sponge count was correct x2 at the end of the case.  Neurovascular status normal.  Pulses 2+ and symmetric.     [name] A. Thurston Hole, M.D.     RAW/MEDQ  D:  05/31/2010  T:  06/01/2010  Job:  [id number]  Electronically Signed by [name] M.D. on 06/09/2010 10:44:01 AM

## 2010-06-10 NOTE — Assessment & Plan Note (Signed)
Summary: 2:00SURGICAL CLEARANCE/DLO   Vital Signs:  Patient profile:   70 year old male Height:      71 inches Weight:      246.25 pounds BMI:     34.47 Temp:     97.7 degrees F oral Pulse rate:   78 / minute Pulse rhythm:   regular Resp:     14 per minute BP sitting:   128 / 54  (left arm) Cuff size:   large  Vitals Entered By: Linde Gillis CMA Duncan Dull) (April 26, 2010 1:56 PM) CC: surgical clearance   History of Present Illness: Here for surgical clearance  Left TKR by Dr Thurston Hole  Having sinus infection though for the past 4 days or so Lots of head congestion and drainage--clear at first and now clumps of yellow worse today Lots of cough Chronic SOB--no change and relates to elevated hemidiaphragm still goes to gym three times per week  No sore throat or ear pain (but is congested)  Allergies: 1)  Simvastatin (Simvastatin) 2)  Lipitor (Atorvastatin Calcium) 3)  * Pentathol  Past History:  Past medical, surgical, family and social histories (including risk factors) reviewed for relevance to current acute and chronic problems.  Past Medical History: Reviewed history from 07/10/2009 and no changes required. Allergic rhinitis Diverticulosis, colon GERD--wit reflux esophagitis and Barretts---------------------Dr Jarold Motto Hyperlipidemia Spinal stenosis Benign prostatic hypertrophy Anxiety Hypertension Osteoarthritis  Past Surgical History: Reviewed history from 07/10/2009 and no changes required. Kidney stones--multiple times Left knee tendon repair--1966 Vasectomy/spermatocele/?testicular cyst--1970 R rotator cuff repair--1/00  Thurston Hole) L eye muscle surgery--2/01 Stress test negative--5/04 EGD/colon--6/04 EGD--Barrett's--7/05 EGD--Barrett's but no cancer--4/08 709 Arthroscopy left knee--Dr Thurston Hole L4-5 repair--  8/10    -------------Dr Rise Mu ([facility]) Left rotator cuff repeair-- 06/2009   Dr Thurston Hole  Family History: Reviewed history from 12/04/2008 and  no changes required. Dad died of lung cancer @87  Mom died of heart trouble @87 .  Had HTN 2 brothers No other CAD or HTN No DM Mom had breast cancer No prostate or colon cancer  Social History: Reviewed history from 12/04/2008 and no changes required. 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  The patient denies chest pain and syncope.         No swallowing problems appetite has been fine stable DOE  Physical Exam  General:  alert.  NAD Head:  mild frontal tenderness but no maxillary tenderness Ears:  R ear normal and L ear normal.   Nose:  moderate congestion and inflammation Mouth:  no erythema and no exudates.   Neck:  supple, no masses, no thyromegaly, and no cervical lymphadenopathy.   Lungs:  normal respiratory effort, no intercostal retractions, no accessory muscle use, no dullness, no crackles, and no wheezes.   Decreased breath sounds at right base (no change) Heart:  normal rate, regular rhythm, no murmur, and no gallop.   Abdomen:  soft and non-tender.   Extremities:  no edema Psych:  normally interactive, good eye contact, not anxious appearing, and not depressed appearing.     Impression & Recommendations:  Problem # 1:  SINUSITIS - ACUTE-NOS (ICD-461.9) Assessment New has been prone to repeated infections generally responds quickly Little or no risk of bactermia with this, so if no fever, and feels okay, should be okay to proceed with TKR on 1/23 will treat with augmentin  His updated medication list  for this problem includes:    Fluticasone Propionate 50 Mcg/act Susp (Fluticasone propionate) ..... Spray 2 spray into both nostrils as needed    Amoxicillin-pot Clavulanate 875-125 Mg Tabs (Amoxicillin-pot clavulanate) .Marland Kitchen... 1 tab by mouth two times a day with food for sinus infection  Problem # 2:  HYPERTENSION  (ICD-401.9) Assessment: Unchanged good control without specific Rx mainly on doxazosin for prostate  His updated medication list for this problem includes:    Doxazosin Mesylate 4 Mg Tabs (Doxazosin mesylate) .Marland Kitchen... 1 at bedtime  BP today: 128/54 Prior BP: 122/870 (04/19/2010)  Labs Reviewed: K+: 4.0 (07/10/2009) Creat: : 0.9 (07/10/2009)   Chol: 173 (07/10/2009)   HDL: 39.90 (07/10/2009)   LDL: 119 (07/10/2009)   TG: 72.0 (07/10/2009)  Problem # 3:  GERD (ICD-530.81) Assessment: Unchanged no swallowing problems needs to stay on PPI  His updated medication list for this problem includes:    Nexium 40 Mg Cpdr (Esomeprazole magnesium) .Marland Kitchen... 1 capsule by mouth once a day  Complete Medication List: 1)  Fluticasone Propionate 50 Mcg/act Susp (Fluticasone propionate) .... Spray 2 spray into both nostrils as needed 2)  Ativan 0.5 Mg Tabs (Lorazepam) .... 1/2-1 tab by mouth at night as needed for anxiety. 3)  Doxazosin Mesylate 4 Mg Tabs (Doxazosin mesylate) .Marland Kitchen.. 1 at bedtime 4)  Finasteride 5 Mg Tabs (Finasteride) .... Take 1 by mouth once daily 5)  Aspir-low 81 Mg Tbec (Aspirin) .... Take one daily 6)  Nexium 40 Mg Cpdr (Esomeprazole magnesium) .Marland Kitchen.. 1 capsule by mouth once a day 7)  Amoxicillin-pot Clavulanate 875-125 Mg Tabs (Amoxicillin-pot clavulanate) .Marland Kitchen.. 1 tab by mouth two times a day with food for sinus infection  Patient Instructions: 1)  Okay to proceed with surgery on Friday 1/23 as long as no fever or breathing difficulty 2)  Please keep your regular appointment Prescriptions: AMOXICILLIN-POT CLAVULANATE 875-125 MG TABS (AMOXICILLIN-POT CLAVULANATE) 1 tab by mouth two times a day with food for sinus infection  #20 x 0   Entered and Authorized by:   Cindee Salt MD   Signed by:   Cindee Salt MD on 04/26/2010   Method used:   Electronically to        [facility] [address] (retail)       [address]       [place], [place]  [address]       Ph: [phone]       Fax: [phone]   RxID:   [id number]    Orders Added: 1)  Est. Patient Level IV [84132]    Current Allergies (reviewed today): SIMVASTATIN (SIMVASTATIN) LIPITOR (ATORVASTATIN CALCIUM) * PENTATHOL

## 2010-06-10 NOTE — Letter (Signed)
Summary: [facility]   [facility]   Imported By: Roderic Ovens 04/27/2010 16:26:54  _____________________________________________________________________  External Attachment:    Type:   Image     Comment:   External Document

## 2010-06-10 NOTE — Assessment & Plan Note (Signed)
Summary: DRAINAGE/CLE   Vital Signs:  Patient profile:   70 year old male Weight:      246 pounds Temp:     98.0 degrees F oral Pulse rate:   68 / minute Pulse rhythm:   regular Resp:     14 per minute BP sitting:   132 / 76  (left arm) Cuff size:   large  Vitals Entered By: Mervin Hack CMA Duncan Dull) (May 21, 2010 3:07 PM) CC: drainage, cough   History of Present Illness: Surgery due next week Having ongoing drainage---clear Making him hoarse  Some cough--- 2-3 times per day will have coughing spell Clear sputum  No fever No headaches No SOB  Finished the antibiotic about 1 week ago  No antihistamines Has heat pump and uses humidifier  Allergies: 1)  Simvastatin (Simvastatin) 2)  Lipitor (Atorvastatin Calcium) 3)  * Pentathol  Past History:  Past medical, surgical, family and social histories (including risk factors) reviewed for relevance to current acute and chronic problems.  Past Medical History: Reviewed history from 07/10/2009 and no changes required. Allergic rhinitis Diverticulosis, colon GERD--wit reflux esophagitis and Barretts---------------------Dr Jarold Motto Hyperlipidemia Spinal stenosis Benign prostatic hypertrophy Anxiety Hypertension Osteoarthritis  Past Surgical History: Reviewed history from 07/10/2009 and no changes required. Kidney stones--multiple times Left knee tendon repair--1966 Vasectomy/spermatocele/?testicular cyst--1970 R rotator cuff repair--1/00  Thurston Hole) L eye muscle surgery--2/01 Stress test negative--5/04 EGD/colon--6/04 EGD--Barrett's--7/05 EGD--Barrett's but no cancer--4/08 709 Arthroscopy left knee--Dr Thurston Hole L4-5 repair--  8/10    -------------Dr Rise Mu ([facility]) Left rotator cuff repeair-- 06/2009   Dr Thurston Hole  Family History: Reviewed history from 12/04/2008 and no changes required. Dad died of lung cancer @87  Mom died of heart trouble @87 .  Had HTN 2 brothers No other CAD or HTN No DM Mom had  breast cancer No prostate or colon cancer  Social History: Reviewed history from 12/04/2008 and no changes required. 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   Head:  no maxillary or frontal tenderness Ears:  cerumen in most of the canal but no inflammation Nose:  moderate pale congestion Mouth:  no erythema and no exudates.   Neck:  supple, no masses, and no cervical lymphadenopathy.   Lungs:  normal respiratory effort, no intercostal retractions, no accessory muscle use, and normal breath sounds.     Impression & Recommendations:  Problem # 1:  POSTNASAL DRIP (ICD-784.91) Assessment New may just be the remnants from infection due for surgery next week that he has been waiting 3 months for  P: try antihistamine   just to be safe, will renew augmentin  Complete Medication List: 1)  Fluticasone Propionate 50 Mcg/act Susp (Fluticasone propionate) .... Spray 2 spray into both nostrils as needed 2)  Ativan 0.5 Mg Tabs (Lorazepam) .... 1/2-1 tab by mouth at night as needed for anxiety. 3)  Doxazosin Mesylate 4 Mg Tabs (Doxazosin mesylate) .Marland Kitchen.. 1 at bedtime 4)  Finasteride 5 Mg Tabs (Finasteride) .... Take 1 by mouth once daily 5)  Aspir-low 81 Mg Tbec (Aspirin) .... Take one daily 6)  Nexium 40 Mg Cpdr (Esomeprazole magnesium) .Marland Kitchen.. 1 capsule by mouth once a day 7)  Amoxicillin-pot Clavulanate 875-125 Mg Tabs (Amoxicillin-pot clavulanate) .Marland Kitchen.. 1 tab by mouth two times a day with food for sinus infection  Other Orders: Influenza Vaccine  MCR [phone]) Administration Flu vaccine - MCR [phone])  Patient Instructions: 1)  Please try loratadine 10mg   1-2 tabs daily for the post nasal drip 2)  Please keep regular appt Prescriptions: AMOXICILLIN-POT CLAVULANATE 875-125 MG TABS (AMOXICILLIN-POT  CLAVULANATE) 1 tab by mouth two times a day with food for sinus infection  #20 x 0   Entered and Authorized by:   Cindee Salt MD   Signed by:   Cindee Salt MD on 05/21/2010   Method used:   Electronically to        [facility] [address] (retail)       [address]       [place], [place]  [address]       Ph: [phone]       Fax: [phone]   RxID:   [id number]    Orders Added: 1)  Est. Patient Level III [27253] 2)  Influenza Vaccine MCR [00025] 3)  Administration Flu vaccine - MCR [G0008]   Immunizations Administered:  Influenza Vaccine # 1:    Vaccine Type: Fluvax MCR    Site: right deltoid    Mfr: GlaxoSmithKline    Dose: 0.5 ml    Route: IM    Given by: Mervin Hack CMA (AAMA)    Exp. Date: 11/06/2010    Lot #: [id number]    VIS given: 12/01/09 version given May 21, 2010.  Flu Vaccine Consent Questions:    Do you have a history of severe allergic reactions to this vaccine? no    Any prior history of allergic reactions to egg and/or gelatin? no    Do you have a sensitivity to the preservative Thimersol? no    Do you have a past history of Guillan-Barre Syndrome? no    Do you currently have an acute febrile illness? no    Have you ever had a severe reaction to latex? no    Vaccine information given and explained to patient? yes   Immunizations Administered:  Influenza Vaccine # 1:    Vaccine Type: Fluvax MCR    Site: right deltoid    Mfr: GlaxoSmithKline    Dose: 0.5 ml    Route: IM    Given by: Mervin Hack CMA (AAMA)    Exp. Date: 11/06/2010    Lot #: [id number]    VIS given: 12/01/09 version given May 21, 2010.  Current Allergies (reviewed today): SIMVASTATIN (SIMVASTATIN) LIPITOR (ATORVASTATIN CALCIUM) * PENTATHOL

## 2010-06-10 NOTE — Assessment & Plan Note (Signed)
Summary: surgical clearance/sl      Allergies Added:   Visit Type:  Follow-up Referring Emilyrose Darrah:  n/a Primary Taiwo Fish:  Nada Libman  CC:  Pre Op.  History of Present Illness: The patient presents for evaluation prior to having left knee replacement. I saw him last year he cleared him for shoulder surgery. He had an abnormal stress perfusion study which led to a catheterization with no coronary artery stenosis. Since that time he's had no new cardiovascular complaints. He denies any chest pressure, neck or arm discomfort. He has had no palpitations, presyncope or syncope. He has had no edema. He is limited by knee pain but still goes to the gym and does some weights and some aerobic activity without limitations.  Current Medications (verified): 1)  Fluticasone Propionate 50 Mcg/act Susp (Fluticasone Propionate) .... Spray 2 Spray Into Both Nostrils As Needed 2)  Ativan 0.5 Mg  Tabs (Lorazepam) .... 1/2-1 Tab By Mouth At Night As Needed For Anxiety. 3)  Doxazosin Mesylate 4 Mg  Tabs (Doxazosin Mesylate) .Marland Kitchen.. 1 At Bedtime 4)  Finasteride 5 Mg Tabs (Finasteride) .... Take 1 By Mouth Once Daily 5)  Aspir-Low 81 Mg Tbec (Aspirin) .... Take One Daily 6)  Nexium 40 Mg Cpdr (Esomeprazole Magnesium) .Marland Kitchen.. 1 Capsule By Mouth Once A Day  Allergies (verified): 1)  Simvastatin (Simvastatin) 2)  Lipitor (Atorvastatin Calcium) 3)  * Pentathol  Past History:  Past Medical History: Reviewed history from 07/10/2009 and no changes required. Allergic rhinitis Diverticulosis, colon GERD--wit reflux esophagitis and Barretts---------------------Dr Jarold Motto Hyperlipidemia Spinal stenosis Benign prostatic hypertrophy Anxiety Hypertension Osteoarthritis  Past Surgical History: Reviewed history from 07/10/2009 and no changes required. Kidney stones--multiple times Left knee tendon repair--1966 Vasectomy/spermatocele/?testicular cyst--1970 R rotator cuff repair--1/00  Thurston Hole) L eye muscle  surgery--2/01 Stress test negative--5/04 EGD/colon--6/04 EGD--Barrett's--7/05 EGD--Barrett's but no cancer--4/08 709 Arthroscopy left knee--Dr Thurston Hole L4-5 repair--  8/10    -------------Dr Rise Mu ([facility]) Left rotator cuff repeair-- 06/2009   Dr Thurston Hole  Review of Systems       As stated in the HPI and negative for all other systems.   Vital Signs:  Patient profile:   70 year old male Height:      71 inches Weight:      240 pounds BMI:     33.59 Pulse rate:   93 / minute Resp:     18 per minute BP sitting:   122 / 870  (right arm)  Vitals Entered By: Marrion Coy, CNA (April 19, 2010 11:58 AM)  Physical Exam  General:  Well developed, well nourished, in no acute distress. Head:  normocephalic and atraumatic Eyes:  PERRLA/EOM intact; conjunctiva and lids normal. Mouth:  Teeth, gums and palate normal. Oral mucosa normal. Neck:  Neck supple, no JVD. No masses, thyromegaly or abnormal cervical nodes. Chest Wall:  no deformities or breast masses noted Lungs:  Clear bilaterally to auscultation and percussion. Abdomen:  Bowel sounds positive; abdomen soft and non-tender without masses, organomegaly, or hernias noted. No hepatosplenomegaly. Msk:  Back normal, normal gait. Muscle strength and tone normal. Extremities:  No clubbing or cyanosis. Neurologic:  Alert and oriented x 3. Skin:  Intact without lesions or rashes. Cervical Nodes:  no significant adenopathy Axillary Nodes:  no significant adenopathy Inguinal Nodes:  no significant adenopathy Psych:  Normal affect.   Detailed Cardiovascular Exam  Neck    Carotids: Carotids full and equal bilaterally without bruits.      Neck Veins: Normal, no JVD.    Heart  Inspection: no deformities or lifts noted.      Palpation: normal PMI with no thrills palpable.      Auscultation: regular rate and rhythm, S1, S2 without murmurs, rubs, gallops, or clicks.    Vascular    Abdominal Aorta: no palpable masses, pulsations, or  audible bruits.      Femoral Pulses: normal femoral pulses bilaterally.      Pedal Pulses: normal pedal pulses bilaterally.      Radial Pulses: normal radial pulses bilaterally.      Peripheral Circulation: no clubbing, cyanosis, or edema noted with normal capillary refill.     EKG  Procedure date:  04/19/2010  Findings:      sinus rhythm, rate 82, left axis deviation, no acute ST-T wave changes  Impression & Recommendations:  Problem # 1:  PRE-OPERATIVE CARDIOVASCULAR EXAMINATION (ICD-V72.81) The patient is active. He has no chest pain. or other symptoms since his catheterization. According to ACC/AHA guidelines he is at acceptable risk for the planned surgery without further evaluation. Orders: EKG w/ Interpretation (93000)  Problem # 2:  HYPERLIPIDEMIA (ICD-272.4) His lipids are excellent.  He will continue with the meds as listed.  Problem # 3:  OBESITY (ICD-278.00) We discussed the need for weight loss once he is able to exercise more.  Patient Instructions: 1)  Your physician recommends that you schedule a follow-up appointment as needed 2)  Your physician recommends that you continue on your current medications as directed. Please refer to the Current Medication list given to you today. 3)  You have been cleared for surgery

## 2010-06-29 NOTE — Discharge Summary (Signed)
NAMEHARRISON, [name] NO.:  [id number]  MEDICAL RECORD NO.:  [id number]          PATIENT TYPE:  INP  LOCATION:  5003                         FACILITY:  [facility]  PHYSICIAN:  Orey Moure A. Thurston Hole, M.D. DATE OF BIRTH:  July 15, 1940  DATE OF ADMISSION:  05/31/2010 DATE OF DISCHARGE:  06/03/2010                              DISCHARGE SUMMARY   ADMITTING DIAGNOSES: 1. End-stage degenerative joint disease, left knee. 2. Gastroesophageal reflux disease. 3. History of diverticulosis. 4. Hyperlipidemia. 5. Spinal stenosis. 6. Benign prostatic hypertrophy. 7. Anxiety. 8. Hypertension. 9. Osteoarthritis.  DISCHARGE DIAGNOSES: 1. End-stage degenerative joint disease, status post left total knee     replacement. 2. Acute blood loss anemia. 3. Hyponatremia that has resolved. 4. Hypertension. 5. Gastroesophageal reflux disease. 6. History of diverticulosis. 7. Hyperlipidemia. 8. Spinal stenosis. 9. Benign prostatic hypertrophy. 10.Anxiety. 11.Osteoarthritis.  HISTORY OF PRESENT ILLNESS:  The patient is a 70 year old white male with a history of end-stage DJD of his left knee.  He has failed conservative care including anti-inflammatories and articular cortisone injections and debriding arthroscopy.  Risks, benefits and possible complications of left total knee replacement were discussed in detail with this patient.  PROCEDURES IN-HOUSE:  On May 31, 2010, the patient underwent a left total knee replacement by Dr. Thurston Hole, a left femoral nerve block by anesthesia.  HOSPITAL COURSE:  Postoperatively, the patient was placed on a CPM 0-90 degrees in the recovery room on postop day #0.  Postop day #1, the patient's catheter was removed.  The patient was able to pee without difficulty.  He was up with physical therapy.  Social work was consulted for skilled nursing placement.  His hemoglobin was 9.8.  His sodium was stable.  He was afebrile.  He precipitated well on  physical therapy, ambulating approximately 100 feet.  Postop day #2, the patient's sodium was low at 134.  Temperature was 98.3.  Hemoglobin was 8.9.  He ambulated another 100 feet with physical therapy.  Range of motion is approximately 63 degrees to 0.  Postop day #3, the patient's hemoglobin was 7.9.  He was transfused 2 units of packed red blood cells with 20 mg of Lasix between units.  Surgical wound is well-approximated.  He is metabolically stable with a normal sodium of 138.  He is being discharged to skilled nursing.  Weightbearing as tolerated.  On a regular diet.  After he is transfused this 2 units of blood, he will need a day a H and H tomorrow to follow with hemoglobin while he is in skilled nursing and will need physical therapy daily, occupational therapy daily.  We will need to have his left heel propped up on a folded pillow for 30 minutes twice a day to work on passive extension of his knee who need a CPM 0-90 degrees 6 hours a day whether it would be 2 hours in the morning, 2 hours in the afternoon, 2 hours in the evening or 3 hours in the morning, 3 hours in the afternoon.  He is ambulating weightbearing as tolerated.  Please call our office with increased pain, increased redness, increased drainage or a  temp greater than 101.  He needs follow up in our office on June 13, 2010.  Please call our office [phone] to make that appointment.  DISCHARGE MEDICATIONS:  Are as follows; 1. Finasteride 5 mg p.o. daily. 2. Doxazosin 4 mg one p.o. at bedtime. 3. Xarelto 10 mg one p.o. daily for the next 12 days for DVT     prophylaxis. 4. Lorazepam 5 mg p.o. 1-2 tablets p.o. at bedtime p.r.n. 5. Colace 100 mg 1 tablet twice a day while on narcotics. 6. Robaxin 500 mg 1 p.o. q.6 h. p.r.n. muscle spasm. 7. Flonase nasal spray 2 tablets daily as needed. 8. Dilaudid 2 mg 1-2 tablets every 3-4 hours as needed for pain. 9. Aspirin 81 mg 1 tablet daily. 10.Nexium 40 mg 1 tablet  daily.  He will go to skilled nursing to work on range of motion, strengthening, gait training and follow up with [place] on June 13, 2010.  He is weightbearing as tolerated on a regular diet.     Kirstin Shepperson, P.A.   ______________________________ Elana Alm Thurston Hole, M.D.    KS/MEDQ  D:  06/03/2010  T:  06/03/2010  Job:  [id number]  Electronically Signed by Julien Girt P.A. on 06/15/2010 12:40:24 PM Electronically Signed by Salvatore Marvel M.D. on 06/29/2010 08:34:22 AM

## 2010-07-08 ENCOUNTER — Encounter: Payer: Self-pay | Admitting: Internal Medicine

## 2010-07-13 ENCOUNTER — Ambulatory Visit: Payer: Self-pay | Admitting: Family Medicine

## 2010-07-15 ENCOUNTER — Encounter: Payer: Self-pay | Admitting: Family Medicine

## 2010-07-15 ENCOUNTER — Ambulatory Visit (INDEPENDENT_AMBULATORY_CARE_PROVIDER_SITE_OTHER): Payer: Medicare Other | Admitting: Family Medicine

## 2010-07-15 DIAGNOSIS — J019 Acute sinusitis, unspecified: Secondary | ICD-10-CM | POA: Insufficient documentation

## 2010-07-20 NOTE — Letter (Signed)
Summary: [facility]  [facility]   Imported By: Kassie Mends 07/13/2010 08:26:21  _____________________________________________________________________  External Attachment:    Type:   Image     Comment:   External Document  Appended Document: [facility] PSA down on finasteride

## 2010-07-20 NOTE — Assessment & Plan Note (Signed)
Summary: CONGESTION,EARS/CLE   MEDICARE/BCBS   Vital Signs:  Patient profile:   70 year old male Height:      71 inches Weight:      223.75 pounds BMI:     31.32 Temp:     98.4 degrees F oral Pulse rate:   92 / minute Pulse rhythm:   regular BP sitting:   102 / 70  (left arm) Cuff size:   large  Vitals Entered By: [name] CMA [name]) (July 15, 2010 12:07 PM) CC: Congestion, ears, ? sinus infection.   History of Present Illness: L TKR by Dr. Thurston Hole 6 weeks ago.  Mother in law died night before last.    Ears stuffy and can't open now- progressively worse.  Nasal congestion.  Using flonase.  Yellow post nasal gtt.  Hoarse.  No fevers.  No  cough.    Allergies: 1)  Simvastatin (Simvastatin) 2)  Lipitor (Atorvastatin Calcium) 3)  * Pentathol  Review of Systems       See HPI.  Otherwise negative.    Physical Exam  General:  GEN: nad, alert and oriented HEENT: mucous membranes moist, TM w/o erythema, some cerumen in both canals, nasal epithelium injected, OP with cobblestoning, max sinuses minimally tender to palpation, no frontal pain on palpation NECK: supple w/o LA CV: rrr. PULM: ctab, no inc wob   Impression & Recommendations:  Problem # 1:  SINUSITIS - ACUTE-NOS (ICD-461.9) hold amoxil for now.  potentially viral and he is nontoxic.  I would allow this to try to self resolve and then start the antibiotics if not improved.  He understood.  Supportive tx in meantime with nasal saline and flonase.  Fluids, rest.  follow up as needed.  The following medications were removed from the medication list:    Amoxicillin-pot Clavulanate 875-125 Mg Tabs (Amoxicillin-pot clavulanate) .Marland Kitchen... 1 tab by mouth two times a day with food for sinus infection His updated medication list for this problem includes:    Fluticasone Propionate 50 Mcg/act Susp (Fluticasone propionate) ..... Spray 2 spray into both nostrils as needed    Amoxicillin 875 Mg Tabs (Amoxicillin) .Marland Kitchen... 1 by mouth  two times a day  Complete Medication List: 1)  Fluticasone Propionate 50 Mcg/act Susp (Fluticasone propionate) .... Spray 2 spray into both nostrils as needed 2)  Ativan 0.5 Mg Tabs (Lorazepam) .... 1/2-1 tab by mouth at night as needed for anxiety. 3)  Doxazosin Mesylate 4 Mg Tabs (Doxazosin mesylate) .Marland Kitchen.. 1 at bedtime 4)  Finasteride 5 Mg Tabs (Finasteride) .... Take 1 by mouth once daily 5)  Aspir-low 81 Mg Tbec (Aspirin) .... Take one daily 6)  Nexium 40 Mg Cpdr (Esomeprazole magnesium) .Marland Kitchen.. 1 capsule by mouth once a day 7)  Robaxin 500 Mg Tabs (Methocarbamol) .... Take 1 tablet by mouth every 8 hours 8)  Hydrocodone-acetaminophen 2.5-500 Mg Tabs (Hydrocodone-acetaminophen) .... As needed, before therapy.  no more than 9 per day. 9)  Amoxicillin 875 Mg Tabs (Amoxicillin) .Marland Kitchen.. 1 by mouth two times a day  Patient Instructions: 1)  Hold the antibiotics for a few days.  Use nasal saline several times a day and the flonase at night.  If you aren't getting better, then start the antibiotics.  Take care.  Try to get some rest and drink plenty of fluids.   Prescriptions: AMOXICILLIN 875 MG TABS (AMOXICILLIN) 1 by mouth two times a day  #20 x 0   Entered and Authorized by:   Crawford Givens MD  Signed by:   Crawford Givens MD on 07/15/2010   Method used:   Print then Give to Patient   RxID:   [id number]    Orders Added: 1)  Est. Patient Level III [65784]    Current Allergies (reviewed today): SIMVASTATIN (SIMVASTATIN) LIPITOR (ATORVASTATIN CALCIUM) * PENTATHOL

## 2010-07-21 ENCOUNTER — Encounter: Payer: Self-pay | Admitting: Internal Medicine

## 2010-08-18 ENCOUNTER — Telehealth: Payer: Self-pay | Admitting: *Deleted

## 2010-08-18 NOTE — Telephone Encounter (Signed)
He will need an appt to discuss this I cannot prescribe anything without an evaluation in the office

## 2010-08-18 NOTE — Telephone Encounter (Signed)
Patient recently had knee replacement, and he just left Dr. Jonetta Osgood office and was told to call our office to request an antidepressant. He has been feeling depressed and has been in chronic pain since the surgery. Uses [facility].

## 2010-08-19 ENCOUNTER — Other Ambulatory Visit: Payer: Self-pay | Admitting: *Deleted

## 2010-08-19 MED ORDER — LORAZEPAM 0.5 MG PO TABS: 0.5000 mg | ORAL_TABLET | Freq: Three times a day (TID) | ORAL | Status: DC

## 2010-08-19 NOTE — Telephone Encounter (Signed)
Appt scheduled

## 2010-08-19 NOTE — Telephone Encounter (Signed)
RX called into pharmacy

## 2010-08-19 NOTE — Telephone Encounter (Signed)
Patient calling for refill, asking that it be done within the hour. I advised patient that Dr.Letvak was not in, pt stated he is going out of town within the hour and needed it ASAP.

## 2010-08-19 NOTE — Telephone Encounter (Signed)
Okay #90 x 0 Not reasonable to ask for refills within 1 hour-----he needs to think ahead a little more than that!!

## 2010-08-23 ENCOUNTER — Ambulatory Visit (HOSPITAL_BASED_OUTPATIENT_CLINIC_OR_DEPARTMENT_OTHER)
Admission: RE | Admit: 2010-08-23 | Discharge: 2010-08-23 | Disposition: A | Discharge status: Home / Self Care | Payer: Medicare Other | Source: Ambulatory Visit | Attending: Orthopedic Surgery | Admitting: Orthopedic Surgery

## 2010-08-23 ENCOUNTER — Telehealth: Payer: Self-pay | Admitting: Cardiology

## 2010-08-23 DIAGNOSIS — Z01812 Encounter for preprocedural laboratory examination: Secondary | ICD-10-CM | POA: Insufficient documentation

## 2010-08-23 DIAGNOSIS — Z79899 Other long term (current) drug therapy: Secondary | ICD-10-CM | POA: Insufficient documentation

## 2010-08-23 DIAGNOSIS — Z96659 Presence of unspecified artificial knee joint: Secondary | ICD-10-CM | POA: Insufficient documentation

## 2010-08-23 DIAGNOSIS — K219 Gastro-esophageal reflux disease without esophagitis: Secondary | ICD-10-CM | POA: Insufficient documentation

## 2010-08-23 DIAGNOSIS — M25669 Stiffness of unspecified knee, not elsewhere classified: Secondary | ICD-10-CM | POA: Insufficient documentation

## 2010-08-23 NOTE — Telephone Encounter (Signed)
12 lead faxed to [facility] @ [phone] 08/23/10/KM

## 2010-08-25 ENCOUNTER — Encounter: Payer: Self-pay | Admitting: Internal Medicine

## 2010-08-30 ENCOUNTER — Ambulatory Visit (INDEPENDENT_AMBULATORY_CARE_PROVIDER_SITE_OTHER): Payer: Medicare Other | Admitting: Internal Medicine

## 2010-08-30 ENCOUNTER — Encounter: Payer: Self-pay | Admitting: Internal Medicine

## 2010-08-30 VITALS — BP 102/60 | HR 86 | Temp 98.7°F | Ht 71.0 in | Wt 226.0 lb

## 2010-08-30 DIAGNOSIS — F4321 Adjustment disorder with depressed mood: Secondary | ICD-10-CM

## 2010-08-30 MED ORDER — CITALOPRAM HYDROBROMIDE 10 MG PO TABS: 10.0000 mg | ORAL_TABLET | Freq: Every day | ORAL | Status: DC

## 2010-08-30 NOTE — Progress Notes (Signed)
Subjective:    Patient ID: [name], male    DOB: Apr 27, 1941, 70 y.o.   MRN: [id number]  HPI Wife is very concerned about his mood He doesn't agree  Had left TKR in January--- "hasn't gone too well" Very painful Has limited flexion and is still working with PT Had closed manipulation last week to try to break up scar tissue--still limited extension Dr Wyline Mood  Wife notes that he varies from crying some times, and very angry at others Has CPM machine but he can't sleep on his back He notes "I'm edgy" He doesn't think he has been crying of late  No suicidal thoughts No daily sadness "I'm frustrated....Marland KitchenMarland KitchenI am active and I can't be" Not hopeless, not really helpless "I feel it is time to change therapists"  Current outpatient prescriptions:aspirin 81 MG tablet, Take 81 mg by mouth daily.  , Disp: , Rfl: ;  doxazosin (CARDURA) 4 MG tablet, Take 4 mg by mouth at bedtime.  , Disp: , Rfl: ;  esomeprazole (NEXIUM) 40 MG capsule, Take 40 mg by mouth daily before breakfast.  , Disp: , Rfl: ;  finasteride (PROSCAR) 5 MG tablet, Take 5 mg by mouth daily.  , Disp: , Rfl:  fluticasone (FLONASE) 50 MCG/ACT nasal spray, 2 sprays by Nasal route daily.  , Disp: , Rfl: ;  HYDROmorphone (DILAUDID) 2 MG tablet, 1-2 tablets by mouth every 4-6 hours as needed for pain. , Disp: , Rfl: ;  LORazepam (ATIVAN) 0.5 MG tablet, 1/2-1 tab by mouth at night as needed for anxiety. , Disp: , Rfl: ;  DISCONTD: LORazepam (ATIVAN) 0.5 MG tablet, Take 1 tablet (0.5 mg total) by mouth every 8 (eight) hours., Disp: 30 tablet, Rfl: 0 DISCONTD: HYDROcodone-acetaminophen (VICODIN) 2.5-500 MG per tablet, Take 1 tablet by mouth every 6 (six) hours as needed.  , Disp: , Rfl: ;  DISCONTD: methocarbamol (ROBAXIN) 500 MG tablet, Take 500 mg by mouth 3 (three) times daily.  , Disp: , Rfl:   Past Medical History  Diagnosis Date  . Allergy   . Diverticulitis   . GERD (gastroesophageal reflux disease)   . Hyperlipidemia   .  Anxiety   . Hypertension   . Osteoarthritis   . Spinal stenosis   . BPH (benign prostatic hypertrophy)   . Kidney stones     Past Surgical History  Procedure Date  . Tendon repair     Left knee tendon repair--1966  . Vasectomy     Vasectomy/spermatocele/?testicular cyst--1970  . Rotator cuff repair     R rotator cuff repair--1/00  Thurston Hole)  . Eye muscle surgery     L eye muscle surgery--2/01  . Cardiovascular stress test     negative  . Esophagogastroduodenoscopy     colon--6/04, Barrett's--7/05, Barrett's but no cancer--4/08  . Knee arthroscopy     7/ 09 Arthroscopy left knee--Dr Thurston Hole    Family History  Problem Relation Age of Onset  . Hypertension Mother   . Cancer Mother     breast cancer  . Coronary artery disease Neg Hx   . Diabetes Neg Hx     History   Social History  . Marital Status: Married    Spouse Name: N/A    Number of Children: 3  . Years of Education: N/A   Occupational History  . retired    Social History Main Topics  . Smoking status: Never Smoker   . Smokeless tobacco: Never Used  . Alcohol Use: No  .  Drug Use: No  . Sexually Active: Not on file   Other Topics Concern  . Not on file   Social History Narrative  . No narrative on file   Review of Systems Sleeps fine Appetite is back now---had lost 37# but is eating better now    Objective:   Physical Exam  Constitutional: He appears well-developed and well-nourished. No distress.  Psychiatric: He has a normal mood and affect. His behavior is normal. Judgment and thought content normal.          Assessment & Plan:

## 2010-09-21 NOTE — Assessment & Plan Note (Signed)
[facility]                         GASTROENTEROLOGY OFFICE NOTE   Arthur, Johnston                      MRN:          [id number]  DATE:06/07/2007                            DOB:          May 24, 1940    [name] continues with postprandial fullness and distention with  associated shortness of breath.  He denies any reflux symptoms or  hepatobiliary complaints.  He has a large hiatal hernia and known  Barrett's mucosa and is on Nexium 40 mg a day along with daily  Metamucil.  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  He is on Reglan 10 mg at bedtime, in the past has taken this before  meals because of suspected gastroparesis.  The patient is not diabetic  and only other medications are pravastatin 20 mg a day and p.r.n.  Carafate suspension.   He is a healthy appearing white male in no acute distress, appearing  stated age.  He weighs 241 pounds, which is his normal weight, and blood  pressure is 112/78.  Pulse was 80 and regular.  I could not appreciate  stigmata of chronic liver disease.  His abdominal exam showed no  significant distention, organomegaly, masses or tenderness.  I could not  appreciate a succussion splash in the epigastric area and bowel sounds  were normal.  His chest was clear, he appeared to be in a regular rhythm  without murmurs, gallops or rubs.   ASSESSMENT:  1. 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  2. Rule out gastroparesis.  3. Chronic gastroesophageal reflux disease with known Barrett's mucosa      on proton pump inhibitor therapy.  4. Hypertensive cardiovascular disease.   RECOMMENDATIONS:  1. Technetium gastric emptying scan to see if we need to go up on his      Reglan dose or use domperidone.  2. Continue reflux regimen with daily PPI therapy.  3. Continue  other medications as per Dr. Alphonsus Johnston.     [name]. Arthur Motto, MD, [facility], FAGA  Electronically Signed    DRP/MedQ  DD: 06/07/2007  DT: 06/07/2007  Job #: [id number]   cc:   Arthur Schwalbe, MD  Arthur Rotunda, MD, [facility]

## 2010-09-21 NOTE — Cardiovascular Report (Signed)
NAMELEITH, [name] NO.:  [id number]   MEDICAL RECORD NO.:  [id number]          PATIENT TYPE:  OIB   LOCATION:  1965                         FACILITY:  [facility]   PHYSICIAN:  Marca Ancona, MD      DATE OF BIRTH:  1940/11/28   DATE OF PROCEDURE:  12/26/2008  DATE OF DISCHARGE:  12/26/2008                            CARDIAC CATHETERIZATION   PRIMARY CARDIOLOGIST:  Rollene Rotunda, MD, [facility]   PROCEDURE:  1. Left heart catheterization.  2. Coronary angiography.  3. Left ventriculography.   INDICATIONS:  The patient had a positive Myoview and is planned to go  for surgery on his back.   PROCEDURE NOTE:  After informed consent was obtained, the right groin  was sterilely prepped and draped and 1% lidocaine was used to local  anesthetize the right groin area.  The right common femoral artery was  engaged using Seldinger technique and a 4-French arterial sheath was  placed.  The left coronary artery was engaged using a JL-4 catheter.  The right coronary artery was engaged using a 3-DRC catheter and the  left ventricle was entered using the angled pigtail catheter.  There  were no complications.   FINDINGS:  1. Hemodynamics:  LV 118/13, aorta 103/59.  I do not think that there      is a true gradient across the aortic valve.  2. Left ventriculography:  EF was 55%.  There were no wall motion      abnormalities.  No significant mitral regurgitation.  3. RCA:  The right coronary artery was a small vessel that does appear      to be codominant.  There is a small right-sided PDA..  4. The left main had no angiographic coronary artery disease.  5. The left circumflex was a large codominant vessel that provided a      small first obtuse marginal, a moderate-sized second obtuse      marginal, and a left-sided PDA.  The circumflex system was free      from obstructive coronary artery disease.  6. LAD system:  The LAD system was free from obstructive coronary      artery  disease.  There was a large early first diagonal and a      smaller second diagonal.   ASSESSMENT/PLAN:  There is no angiographic coronary artery disease  noted.  The patient has normal left ventricular systolic function.  No  further testing is needed prior to surgery.     Marca Ancona, MD  Electronically Signed    DM/MEDQ  D:  12/26/2008  T:  12/27/2008  Job:  [id number]   cc:   Rollene Rotunda, MD, [facility]

## 2010-09-21 NOTE — Letter (Signed)
December 26, 2008    Arthur Dare, MD  [address]  [place], [place] [address]   RE:  [name], [name]  MRN:  [id number]  /  DOB:  May 02, 1941   Dear Dr. Rise Mu,   This letter regards [name].  He is to undergo back surgery soon at  [facility].  I saw him for preoperative clearance.  He had an apparent history  of a myocardial infarction in the 43s.  He had significant  cardiovascular risk factors.  I did send him for stress perfusion study  which suggested apical and inferior ischemia.  However, cardiac  catheterization demonstrated no obstructive coronary artery disease.  Therefore, based on this, no further cardiovascular testing is  suggested.  The patient would be at acceptable risk for planned surgery.  He should continue the medications as listed holding his antiplatelet  agents as you feel necessary.  If you have any questions about this, my  cell phone number is [phone].    Sincerely,      Arthur Rotunda, MD, [facility]  Electronically Signed    JH/MedQ  DD: 12/26/2008  DT: 12/27/2008  Job #: [id number]

## 2010-09-21 NOTE — Assessment & Plan Note (Signed)
[facility]                            CARDIOLOGY OFFICE NOTE   [name], [name]                      MRN:          [id number]  DATE:10/22/2007                            DOB:          1940/11/24    PRIMARY:  Karie Schwalbe, MD.   REASON FOR PRESENTATION:  The patient with shortness of breath.   HISTORY OF PRESENT ILLNESS:  The patient is a pleasant 70 year old who I  saw in 2004 for evaluation of dyspnea.  He had an exercise treadmill  test, which was negative for any evidence of ischemia.  Since that time,  he has continued to have dyspnea with activity such as exerting himself  fairly significantly.  He does not have any shortness of breath at rest.  He denies any PND or orthopnea.  He has been having episodes of acute  anxiety and sweating with shortness of breath when he goes to bed at  night.  He has had about 3 in the last month.  The most severe was 3  weeks ago.  He went into the shower and calmed down.  He says this is  only when he is going to sleep and not brought on by any activity.  He  feels panicked.  He does not have any chest pressure, neck, or arm  discomfort.  He does not describe nausea or vomiting.  He does get  diaphoretic and feels his heart racing.  He has ascribed these to panic.   PAST MEDICAL HISTORY:  He has no history of hypertension or diabetes.  He does have dyslipidemia x6 years.  Nephrolithiasis.   PAST SURGICAL HISTORY:  Knee surgery.   ALLERGIES:  PENTOTHAL and SIMVASTATIN.   MEDICATIONS:  1. Aspirin 81 mg daily.  2. Motrin p.r.n.  3. Pravastatin 20 mg daily.  4. Nexium.  5. Metamucil.   SOCIAL HISTORY:  The patient has never smoked cigarettes.  He is  retired.  He is married and has 3 children and 8 grandchildren.   FAMILY HISTORY:  Noncontributory for early coronary artery disease.   REVIEW OF SYSTEMS:  As stated in the HPI and otherwise negative for  other systems.   PHYSICAL EXAMINATION:   GENERAL:  The patient is in no distress.  VITAL SIGNS:  Blood pressure 117/83, heart rate 99 and regular, weight  235 pounds, and body mass index 32.  HEENT:  Eyes are unremarkable.  Pupils are equal, round, and reactive to  light.  Fundi not visualized.  Oral mucosa unremarkable.  NECK:  No jugular venous distention at 45 degrees.  Carotid upstroke  brisk and symmetrical.  No bruits.  No thyromegaly.  LYMPHATICS:  No cervical, axillary, or inguinal adenopathy.  LUNGS:  Clear to auscultation bilaterally.  BACK:  No costovertebral angle tenderness.  CHEST:  Unremarkable.  HEART:  PMI not displaced or sustained.  S1 and S2 within normal limits.  No S3, no S4, no clicks, no rubs, and no murmurs.  ABDOMEN:  Mildly obese.  Positive bowel sound normal in frequency and  pitch.  No bruits, no rebound, and no  guarding.  No midline pulsatile  mass.  No hepatomegaly.  No splenomegaly.  SKIN:  No rashes, no nodules.  EXTREMITIES:  Pulses, 2+, throughout.  No edema, no cyanosis, and no  clubbing.  NEURO:  Oriented to person, place, and time.  Cranial nerves II-XII  grossly intact.  Motor grossly intact.   EKG:  Sinus rhythm, rate 87, left axis deviation, left anterior  fascicular block, and no acute ST-T wave changes.  No significant change  from previous EKGs.   ASSESSMENT AND PLAN:  1. Dyspnea.  The patient has some mild dyspnea with exertion and also      has spells of shortness of breath with what he thinks is panic at      night.  He may well be right that this is panic.  He does have      cardiovascular risk factors.  To further evaluate this, I will      screen him with a plain old exercise treadmill (POET).  Further      evaluation based on these results.  2. Dyslipidemia.  I will get the labs from Dr. Karle Starch office to      review the LDL and HDL.  3. Obesity.  We will discuss weight loss with diet and exercise again      when I see him.  He has lost a few pounds at least since the  last      visit.  4. Followup.  I will see him at the time of his treadmill.     Rollene Rotunda, MD, [facility]  Electronically Signed    JH/MedQ  DD: 10/22/2007  DT: 10/23/2007  Job #: [id number]   cc:   Karie Schwalbe, MD

## 2010-09-28 ENCOUNTER — Ambulatory Visit: Payer: Medicare Other | Admitting: Internal Medicine

## 2010-10-05 ENCOUNTER — Encounter: Payer: Self-pay | Admitting: Internal Medicine

## 2010-10-05 ENCOUNTER — Ambulatory Visit (INDEPENDENT_AMBULATORY_CARE_PROVIDER_SITE_OTHER): Payer: Medicare Other | Admitting: Internal Medicine

## 2010-10-05 VITALS — BP 140/80 | HR 83 | Temp 98.7°F | Ht 71.0 in | Wt 229.0 lb

## 2010-10-05 DIAGNOSIS — F411 Generalized anxiety disorder: Secondary | ICD-10-CM

## 2010-10-05 NOTE — Progress Notes (Signed)
  Subjective:    Patient ID: [name], male    DOB: May 12, 1940, 70 y.o.   MRN: [id number]  HPI Wife is here today No apparent problems with the citalopram but ran out 10 days ago and stated there were no refills ( I would have given them)  Now back to working out in the gym Getting out and having more social interaction Not focusing on PT so much  Has been able to get off pain meds--now off dilaudid Still uses NSAID and muscle relaxer  Wife feels is improvement may be due to the med Thinks it would be better for him to stay on it longer Is prone to anxiety and fear in general---just exacerbated after surgery that didn't go well Had been very labile with any situational stress Would "go off" in those circumstances "High strung and anxious" in general  He is concerned about the amount of time she has to spend with her parents MIL just died but wife's father still living in home (97 now)   Review of Systems     Objective:   Physical Exam  Psychiatric: He has a normal mood and affect. His behavior is normal. Judgment and thought content normal.          Assessment & Plan:

## 2011-01-13 ENCOUNTER — Encounter: Payer: Self-pay | Admitting: Internal Medicine

## 2011-01-13 ENCOUNTER — Ambulatory Visit (INDEPENDENT_AMBULATORY_CARE_PROVIDER_SITE_OTHER): Payer: Medicare Other | Admitting: Internal Medicine

## 2011-01-13 VITALS — BP 118/63 | HR 72 | Temp 97.8°F | Wt 240.5 lb

## 2011-01-13 DIAGNOSIS — F411 Generalized anxiety disorder: Secondary | ICD-10-CM

## 2011-01-13 MED ORDER — LORAZEPAM 0.5 MG PO TABS: 0.5000 mg | ORAL_TABLET | Freq: Two times a day (BID) | ORAL | Status: DC | PRN

## 2011-01-13 NOTE — Assessment & Plan Note (Signed)
Really has improved No regular problems now Feels "great" Uses lorazepam about once a week when he doesn't feel right  Chronic sleep issues---will try tylenol still (or can try melatonin) If ongoing problems, can try trazodone

## 2011-01-13 NOTE — Patient Instructions (Signed)
Okay to continue tylenol at bedtime for sleep. If that is not effective, you can try over the counter melatonin (like 3mg  daily at bedtime). Please call if neither of them is working

## 2011-01-13 NOTE — Progress Notes (Signed)
Subjective:    Patient ID: [name], male    DOB: July 13, 1940, 70 y.o.   MRN: [id number]  HPI Doing well Didn't go back on the citalopram as we had discussed Still gets stressed out at times and uses the lorazepam. No more than about once a week  Ongoing sleep problems Wakes at 2-3AM and cannot go back to sleep Tried 2 tylenol at night and that has helped  Knee is finally pain free Went to PT at Dr Jonetta Osgood and noted improvement then  Current Outpatient Prescriptions on File Prior to Visit  Medication Sig Dispense Refill  . aspirin 81 MG tablet Take 81 mg by mouth daily.        Marland Kitchen esomeprazole (NEXIUM) 40 MG capsule Take 40 mg by mouth daily before breakfast.        . finasteride (PROSCAR) 5 MG tablet Take 5 mg by mouth daily.        . fluticasone (FLONASE) 50 MCG/ACT nasal spray 2 sprays by Nasal route daily.        Marland Kitchen LORazepam (ATIVAN) 0.5 MG tablet 1/2-1 tab by mouth at night as needed for anxiety.         Allergies  Allergen Reactions  . Atorvastatin     REACTION: myalgias  . Simvastatin     REACTION: joint and stomach pain    Past Medical History  Diagnosis Date  . Allergy   . Diverticulitis   . GERD (gastroesophageal reflux disease)   . Hyperlipidemia   . Anxiety   . Hypertension   . Osteoarthritis   . Spinal stenosis   . BPH (benign prostatic hypertrophy)   . Kidney stones     Past Surgical History  Procedure Date  . Tendon repair     Left knee tendon repair--1966  . Vasectomy     Vasectomy/spermatocele/?testicular cyst--1970  . Rotator cuff repair     R rotator cuff repair--1/00  Thurston Hole)  . Eye muscle surgery     L eye muscle surgery--2/01  . Cardiovascular stress test     negative  . Esophagogastroduodenoscopy     colon--6/04, Barrett's--7/05, Barrett's but no cancer--4/08  . Knee arthroscopy     7/ 09 Arthroscopy left knee--Dr Thurston Hole  . Joint replacement 1/12    left TKR    Family History  Problem Relation Age of Onset  .  Hypertension Mother   . Cancer Mother     breast cancer  . Coronary artery disease Neg Hx   . Diabetes Neg Hx     History   Social History  . Marital Status: Married    Spouse Name: N/A    Number of Children: 3  . Years of Education: N/A   Occupational History  . retired    Social History Main Topics  . Smoking status: Never Smoker   . Smokeless tobacco: Never Used  . Alcohol Use: No  . Drug Use: No  . Sexually Active: Not on file   Other Topics Concern  . Not on file   Social History Narrative  . No narrative on file   Review of Systems Appetite is fine Goes to gym 3 days per week--volunteer work also Weight is back up again     Objective:   Physical Exam  Constitutional: He appears well-developed and well-nourished. No distress.  Psychiatric: He has a normal mood and affect. His behavior is normal. Judgment and thought content normal.          Assessment &  Plan:

## 2011-01-14 ENCOUNTER — Ambulatory Visit: Payer: Medicare Other | Admitting: Internal Medicine

## 2011-03-02 ENCOUNTER — Ambulatory Visit: Payer: Medicare Other | Admitting: Family Medicine

## 2011-03-23 ENCOUNTER — Other Ambulatory Visit: Payer: Self-pay | Admitting: *Deleted

## 2011-03-23 MED ORDER — LORAZEPAM 0.5 MG PO TABS: 0.5000 mg | ORAL_TABLET | Freq: Two times a day (BID) | ORAL | Status: DC | PRN

## 2011-03-23 NOTE — Telephone Encounter (Signed)
Okay #30 x 0 

## 2011-03-23 NOTE — Telephone Encounter (Signed)
rx called into pharmacy

## 2011-04-17 ENCOUNTER — Other Ambulatory Visit: Payer: Self-pay | Admitting: Internal Medicine

## 2011-04-21 ENCOUNTER — Ambulatory Visit (INDEPENDENT_AMBULATORY_CARE_PROVIDER_SITE_OTHER): Payer: Medicare Other | Admitting: Internal Medicine

## 2011-04-21 ENCOUNTER — Encounter: Payer: Self-pay | Admitting: Internal Medicine

## 2011-04-21 VITALS — BP 148/78 | HR 71 | Temp 98.1°F | Ht 71.0 in | Wt 245.0 lb

## 2011-04-21 DIAGNOSIS — J019 Acute sinusitis, unspecified: Secondary | ICD-10-CM | POA: Insufficient documentation

## 2011-04-21 DIAGNOSIS — Z23 Encounter for immunization: Secondary | ICD-10-CM

## 2011-04-21 MED ORDER — AMOXICILLIN 500 MG PO TABS: 1000.0000 mg | ORAL_TABLET | Freq: Two times a day (BID) | ORAL | Status: AC

## 2011-04-21 MED ORDER — METRONIDAZOLE 0.75 % EX GEL: Freq: Two times a day (BID) | CUTANEOUS | Status: AC

## 2011-04-21 NOTE — Progress Notes (Signed)
Subjective:    Patient ID: [name], male    DOB: [birth year], 70 y.o.   MRN: [id number]  HPI Feels his rosacea is acting up Has used metrogel in past but ran out This worked well  Has drainage back again for 4-5 weeks Has tightness in throat in AM Some wheezing also  No fever Some yellow drainage No headache Cough mostly in the morning No sore throat  No ear pain  Has taken OTC nighttime sinus congestion med Some help with sleep at least  Current Outpatient Prescriptions on File Prior to Visit  Medication Sig Dispense Refill  . aspirin 81 MG tablet Take 81 mg by mouth daily.        . finasteride (PROSCAR) 5 MG tablet Take 5 mg by mouth daily.        . fluticasone (FLONASE) 50 MCG/ACT nasal spray USE 2 SPRAYS IN EACH NOSTRIL AS NEEDED  3 g  2  . LORazepam (ATIVAN) 0.5 MG tablet Take 1 tablet (0.5 mg total) by mouth 2 (two) times daily as needed for anxiety. 1/2-1 tab by mouth at night as needed for anxiety.  30 tablet  0  . NEXIUM 40 MG capsule TAKE 1 CAPSULE DAILY  90 capsule  2    Allergies  Allergen Reactions  . Atorvastatin     REACTION: myalgias  . Simvastatin     REACTION: joint and stomach pain    Past Medical History  Diagnosis Date  . Allergy   . Diverticulitis   . GERD (gastroesophageal reflux disease)   . Hyperlipidemia   . Anxiety   . Hypertension   . Osteoarthritis   . Spinal stenosis   . BPH (benign prostatic hypertrophy)   . Kidney stones     Past Surgical History  Procedure Date  . Tendon repair     Left knee tendon repair--1966  . Vasectomy     Vasectomy/spermatocele/?testicular cyst--1970  . Rotator cuff repair     R rotator cuff repair--1/00  Thurston Hole)  . Eye muscle surgery     L eye muscle surgery--2/01  . Cardiovascular stress test     negative  . Esophagogastroduodenoscopy     colon--6/04, Barrett's--7/05, Barrett's but no cancer--4/08  . Knee arthroscopy     7/ 09 Arthroscopy left knee--Dr Thurston Hole  . Joint replacement  1/12    left TKR    Family History  Problem Relation Age of Onset  . Hypertension Mother   . Cancer Mother     breast cancer  . Coronary artery disease Neg Hx   . Diabetes Neg Hx     History   Social History  . Marital Status: Married    Spouse Name: N/A    Number of Children: 3  . Years of Education: N/A   Occupational History  . retired    Social History Main Topics  . Smoking status: Never Smoker   . Smokeless tobacco: Never Used  . Alcohol Use: No  . Drug Use: No  . Sexually Active: Not on file   Other Topics Concern  . Not on file   Social History Narrative  . No narrative on file   Review of Systems No vomiting or diarrhea Appetite is okay     Objective:   Physical Exam  Constitutional: He appears well-developed and well-nourished. No distress.  HENT:  Head: Normocephalic and atraumatic.  Right Ear: External ear normal.  Left Ear: External ear normal.  Mouth/Throat: Oropharynx is clear and moist.  No oropharyngeal exudate.       No sinus tenderness Moderate nasal inflammation  Neck: Normal range of motion. Neck supple.  Pulmonary/Chest: Effort normal and breath sounds normal. No respiratory distress. He has no wheezes. He has no rales.  Lymphadenopathy:    He has no cervical adenopathy.          Assessment & Plan:

## 2011-04-21 NOTE — Assessment & Plan Note (Signed)
Not clearly infectious but may be  Persistent drainage and AM cough (mostly) Will treat with amoxil

## 2011-06-28 ENCOUNTER — Ambulatory Visit (INDEPENDENT_AMBULATORY_CARE_PROVIDER_SITE_OTHER): Payer: Medicare Other | Admitting: Internal Medicine

## 2011-06-28 ENCOUNTER — Encounter: Payer: Self-pay | Admitting: Internal Medicine

## 2011-06-28 VITALS — BP 122/78 | HR 66 | Temp 98.1°F | Wt 240.0 lb

## 2011-06-28 DIAGNOSIS — I10 Essential (primary) hypertension: Secondary | ICD-10-CM

## 2011-06-28 DIAGNOSIS — J019 Acute sinusitis, unspecified: Secondary | ICD-10-CM | POA: Diagnosis not present

## 2011-06-28 DIAGNOSIS — E785 Hyperlipidemia, unspecified: Secondary | ICD-10-CM | POA: Diagnosis not present

## 2011-06-28 DIAGNOSIS — R972 Elevated prostate specific antigen [PSA]: Secondary | ICD-10-CM | POA: Diagnosis not present

## 2011-06-28 DIAGNOSIS — F411 Generalized anxiety disorder: Secondary | ICD-10-CM

## 2011-06-28 LAB — CBC WITH DIFFERENTIAL/PLATELET
Basophils Absolute: 0 10*3/uL (ref 0.0–0.1)
Eosinophils Absolute: 0.3 10*3/uL (ref 0.0–0.7)
Lymphocytes Relative: 18.4 % (ref 12.0–46.0)
MCHC: 33.6 g/dL (ref 30.0–36.0)
Monocytes Relative: 8.5 % (ref 3.0–12.0)
Neutrophils Relative %: 68.7 % (ref 43.0–77.0)
Platelets: 202 10*3/uL (ref 150.0–400.0)
RDW: 13.4 % (ref 11.5–14.6)

## 2011-06-28 LAB — BASIC METABOLIC PANEL
CO2: 27 mEq/L (ref 19–32)
Calcium: 9 mg/dL (ref 8.4–10.5)
Creatinine, Ser: 0.8 mg/dL (ref 0.4–1.5)
GFR: 100.13 mL/min (ref >60.00)
Glucose, Bld: 94 mg/dL (ref 70–99)
Sodium: 137 mEq/L (ref 135–145)

## 2011-06-28 LAB — HEPATIC FUNCTION PANEL
ALT: 26 U/L (ref 0–53)
Albumin: 4.1 g/dL (ref 3.5–5.2)
Alkaline Phosphatase: 46 U/L (ref 39–117)
Bilirubin, Direct: 0.1 mg/dL (ref 0.0–0.3)
Total Protein: 6.4 g/dL (ref 6.0–8.3)

## 2011-06-28 LAB — LIPID PANEL
Cholesterol: 234 mg/dL — ABNORMAL HIGH (ref 0–200)
HDL: 40.4 mg/dL (ref >39.00)
Triglycerides: 98 mg/dL (ref 0.0–149.0)

## 2011-06-28 LAB — TSH: TSH: 1.08 u[IU]/mL (ref 0.35–5.50)

## 2011-06-28 MED ORDER — TRAZODONE HCL 50 MG PO TABS: 50.0000 mg | ORAL_TABLET | Freq: Every evening | ORAL | Status: DC | PRN

## 2011-06-28 MED ORDER — AMOXICILLIN-POT CLAVULANATE 875-125 MG PO TABS: 1.0000 | ORAL_TABLET | Freq: Two times a day (BID) | ORAL | Status: AC

## 2011-06-28 NOTE — Progress Notes (Signed)
Subjective:    Patient ID: [name], male    DOB: 03-31-1941, 71 y.o.   MRN: [id number]  HPI Sinus infection again Started about 9 days ago Travelled to [name] [name] 4 days of wife's amoxil Didn't really knock it out  No fever Bad right frontal headache Hearing is "like I am in a barrel" Sore throat yesterday Some cough---which improved with the amoxil Does have congestion and drainage  Still having trouble sleeping Awakens at 1:30AM to void---then can't get back to sleep Anxiety is still okay---lorazepam ~1/week Lorazepam helps at night but tries to avoid  Current Outpatient Prescriptions on File Prior to Visit  Medication Sig Dispense Refill  . aspirin 81 MG tablet Take 81 mg by mouth daily.        . finasteride (PROSCAR) 5 MG tablet Take 5 mg by mouth daily.        . fluticasone (FLONASE) 50 MCG/ACT nasal spray USE 2 SPRAYS IN EACH NOSTRIL AS NEEDED  3 g  2  . metroNIDAZOLE (METROGEL) 0.75 % gel Apply topically 2 (two) times daily.  45 g  3  . NEXIUM 40 MG capsule TAKE 1 CAPSULE DAILY  90 capsule  2    Allergies  Allergen Reactions  . Atorvastatin     REACTION: myalgias  . Simvastatin     REACTION: joint and stomach pain    Past Medical History  Diagnosis Date  . Allergy   . Diverticulitis   . GERD (gastroesophageal reflux disease)   . Hyperlipidemia   . Anxiety   . Hypertension   . Osteoarthritis   . Spinal stenosis   . BPH (benign prostatic hypertrophy)   . Kidney stones     Past Surgical History  Procedure Date  . Tendon repair     Left knee tendon repair--1966  . Vasectomy     Vasectomy/spermatocele/?testicular cyst--1970  . Rotator cuff repair     R rotator cuff repair--1/00  Thurston Hole)  . Eye muscle surgery     L eye muscle surgery--2/01  . Cardiovascular stress test     negative  . Esophagogastroduodenoscopy     colon--6/04, Barrett's--7/05, Barrett's but no cancer--4/08  . Knee arthroscopy     7/ 09 Arthroscopy left knee--Dr  Thurston Hole  . Joint replacement 1/12    left TKR    Family History  Problem Relation Age of Onset  . Hypertension Mother   . Cancer Mother     breast cancer  . Coronary artery disease Neg Hx   . Diabetes Neg Hx     History   Social History  . Marital Status: Married    Spouse Name: N/A    Number of Children: 3  . Years of Education: N/A   Occupational History  . retired    Social History Main Topics  . Smoking status: Never Smoker   . Smokeless tobacco: Never Used  . Alcohol Use: No  . Drug Use: No  . Sexually Active: Not on file   Other Topics Concern  . Not on file   Social History Narrative  . No narrative on file   Review of Systems No vomiting or diarrhea Appetite is okay Working on diet---weight went up some Has been volunteering at [employer]     Objective:   Physical Exam  Constitutional: He appears well-developed and well-nourished. No distress.  HENT:  Head: Normocephalic and atraumatic.       No sinus tenderness Slight pharyngeal injection 90% canals filled with cerumen.  TMs okay Moderate nasal inflammation  Neck: Normal range of motion. Neck supple. No thyromegaly present.  Pulmonary/Chest: Effort normal and breath sounds normal. No respiratory distress. He has no wheezes. He has no rales.  Lymphadenopathy:    He has no cervical adenopathy.  Psychiatric: He has a normal mood and affect. His behavior is normal. Judgment and thought content normal.          Assessment & Plan:

## 2011-06-28 NOTE — Assessment & Plan Note (Signed)
Duration suggests secondary infection Clouded by short term antibiotic use Discussed symptom relief Will treat with augmentin

## 2011-06-28 NOTE — Assessment & Plan Note (Signed)
Better but still with sleep problems Will try trazodone Okay to use prn

## 2011-07-07 ENCOUNTER — Ambulatory Visit: Payer: Medicare Other | Admitting: Internal Medicine

## 2011-07-11 DIAGNOSIS — H40009 Preglaucoma, unspecified, unspecified eye: Secondary | ICD-10-CM | POA: Diagnosis not present

## 2011-07-13 DIAGNOSIS — M1710 Unilateral primary osteoarthritis, unspecified knee: Secondary | ICD-10-CM | POA: Diagnosis not present

## 2011-07-13 DIAGNOSIS — M19049 Primary osteoarthritis, unspecified hand: Secondary | ICD-10-CM | POA: Diagnosis not present

## 2011-07-19 DIAGNOSIS — B351 Tinea unguium: Secondary | ICD-10-CM | POA: Diagnosis not present

## 2011-07-21 ENCOUNTER — Other Ambulatory Visit: Payer: Self-pay | Admitting: *Deleted

## 2011-07-21 MED ORDER — DOXAZOSIN MESYLATE 4 MG PO TABS: 4.0000 mg | ORAL_TABLET | Freq: Every day | ORAL | Status: DC

## 2011-07-21 MED ORDER — ESOMEPRAZOLE MAGNESIUM 40 MG PO CPDR: 40.0000 mg | DELAYED_RELEASE_CAPSULE | Freq: Every day | ORAL | Status: DC

## 2011-07-21 MED ORDER — FLUTICASONE PROPIONATE 50 MCG/ACT NA SUSP: 2.0000 | Freq: Every day | NASAL | Status: DC

## 2011-07-21 MED ORDER — FINASTERIDE 5 MG PO TABS: 5.0000 mg | ORAL_TABLET | Freq: Every day | ORAL | Status: DC

## 2011-07-21 NOTE — Telephone Encounter (Signed)
Error

## 2011-07-26 ENCOUNTER — Encounter: Payer: Self-pay | Admitting: Internal Medicine

## 2011-07-26 ENCOUNTER — Ambulatory Visit (INDEPENDENT_AMBULATORY_CARE_PROVIDER_SITE_OTHER): Payer: Medicare Other | Admitting: Internal Medicine

## 2011-07-26 VITALS — BP 130/70 | HR 86 | Temp 98.3°F | Ht 71.0 in | Wt 240.0 lb

## 2011-07-26 DIAGNOSIS — I10 Essential (primary) hypertension: Secondary | ICD-10-CM | POA: Diagnosis not present

## 2011-07-26 DIAGNOSIS — E785 Hyperlipidemia, unspecified: Secondary | ICD-10-CM

## 2011-07-26 DIAGNOSIS — N419 Inflammatory disease of prostate, unspecified: Secondary | ICD-10-CM | POA: Insufficient documentation

## 2011-07-26 DIAGNOSIS — J019 Acute sinusitis, unspecified: Secondary | ICD-10-CM

## 2011-07-26 DIAGNOSIS — K219 Gastro-esophageal reflux disease without esophagitis: Secondary | ICD-10-CM

## 2011-07-26 DIAGNOSIS — Z0000 Encounter for general adult medical examination without abnormal findings: Secondary | ICD-10-CM | POA: Diagnosis not present

## 2011-07-26 MED ORDER — LEVOFLOXACIN 500 MG PO TABS: 500.0000 mg | ORAL_TABLET | Freq: Every day | ORAL | Status: AC

## 2011-07-26 MED ORDER — PRAVASTATIN SODIUM 80 MG PO TABS: 80.0000 mg | ORAL_TABLET | Freq: Every day | ORAL | Status: DC

## 2011-07-26 NOTE — Assessment & Plan Note (Signed)
Lab Results  Component Value Date   LDLCALC 119* 07/10/2009   Discussed Will restart pravastatin as most recent LDL 174

## 2011-07-26 NOTE — Assessment & Plan Note (Signed)
I have personally reviewed the Medicare Annual Wellness questionnaire and have noted 1. The patient's medical and social history 2. Their use of alcohol, tobacco or illicit drugs 3. Their current medications and supplements 4. The patient's functional ability including ADL's, fall risks, home safety risks and hearing or visual             impairment. 5. Diet and physical activities 6. Evidence for depression or mood disorders  The patients weight, height, BMI and visual acuity have been recorded in the chart I have made referrals, counseling and provided education to the patient based review of the above and I have provided the pt with a written personalized care plan for preventive services.  I have provided you with a copy of your personalized plan for preventive services. Please take the time to review along with your updated medication list.  Rx given for zostavax No other interventions indicated Working on fitness again

## 2011-07-26 NOTE — Patient Instructions (Signed)
Please set up blood work in about 2 months---- lipid, hepatic (272.4) 

## 2011-07-26 NOTE — Assessment & Plan Note (Signed)
Recurrent Will treat with levaquin

## 2011-07-26 NOTE — Progress Notes (Signed)
Subjective:    Patient ID: [name], male    DOB: 04/23/1941, 71 y.o.   MRN: [id number]  HPI Here for Medicare wellness visit No depression or anhedonia Reviewed advanced directives No falls or instability Rx given for zostavax Recent PSA---will not do any more UTD on colonoscopy Independent with ADLs No hearing or vision issues  Still feels sick Sinus symptoms improved on the augmentin---then relapsed several days later No fever now No chills or sweats Cough with dark mucus---seems to be from post nasal drip  Having prostate problems Has pain in left groin and down into scrotum Some trouble with starting and stopping urine. Stream is decreased Still on dual therapy Started about a week ago  Has used the trazodone 3 times Helps some  No stomach trouble No swallowing problems  Current Outpatient Prescriptions on File Prior to Visit  Medication Sig Dispense Refill  . aspirin 81 MG tablet Take 81 mg by mouth daily.        Marland Kitchen doxazosin (CARDURA) 4 MG tablet Take 1 tablet (4 mg total) by mouth at bedtime.  90 tablet  3  . esomeprazole (NEXIUM) 40 MG capsule Take 1 capsule (40 mg total) by mouth daily before breakfast.  90 capsule  3  . finasteride (PROSCAR) 5 MG tablet Take 1 tablet (5 mg total) by mouth daily.  90 tablet  3  . fluticasone (FLONASE) 50 MCG/ACT nasal spray Place 2 sprays into the nose daily.  48 g  3  . LORazepam (ATIVAN) 0.5 MG tablet Take 0.5-1 mg by mouth at bedtime as needed.      . metroNIDAZOLE (METROGEL) 0.75 % gel Apply topically 2 (two) times daily.  45 g  3  . traZODone (DESYREL) 50 MG tablet Take 1-2 tablets (50-100 mg total) by mouth at bedtime as needed for sleep.  60 tablet  5    Allergies  Allergen Reactions  . Atorvastatin     REACTION: myalgias  . Simvastatin     REACTION: joint and stomach pain    Past Medical History  Diagnosis Date  . Allergy   . Diverticulitis   . GERD (gastroesophageal reflux disease)   . Hyperlipidemia    . Anxiety   . Hypertension   . Osteoarthritis   . Spinal stenosis   . BPH (benign prostatic hypertrophy)   . Kidney stones     Past Surgical History  Procedure Date  . Tendon repair     Left knee tendon repair--1966  . Vasectomy     Vasectomy/spermatocele/?testicular cyst--1970  . Rotator cuff repair     R rotator cuff repair--1/00  Thurston Hole)  . Eye muscle surgery     L eye muscle surgery--2/01  . Cardiovascular stress test     negative  . Esophagogastroduodenoscopy     colon--6/04, Barrett's--7/05, Barrett's but no cancer--4/08  . Knee arthroscopy     7/ 09 Arthroscopy left knee--Dr Thurston Hole  . Joint replacement 1/12    left TKR    Family History  Problem Relation Age of Onset  . Hypertension Mother   . Cancer Mother     breast cancer  . Coronary artery disease Neg Hx   . Diabetes Neg Hx     History   Social History  . Marital Status: Married    Spouse Name: N/A    Number of Children: 3  . Years of Education: N/A   Occupational History  . retired    Social History Main Topics  .  Smoking status: Never Smoker   . Smokeless tobacco: Never Used  . Alcohol Use: No  . Drug Use: No  . Sexually Active: Not on file   Other Topics Concern  . Not on file   Social History Narrative   Has living willHealth care POA is wife, then oldest daughter RobinWould accept resuscitation attempts. No prolonged artifical ventilationNo tube feeds if cognitively unaware   Review of Systems On pravastatin in past---no problems Appetite is fine No chest pain  Some DOE easier due to missing exercise over the past few months---busy with the [employer]     Objective:   Physical Exam  Constitutional: He is oriented to person, place, and time. He appears well-developed and well-nourished. No distress.  HENT:  Mouth/Throat: Oropharynx is clear and moist. No oropharyngeal exudate.  Neck: Normal range of motion. Neck supple.  Cardiovascular: Normal rate, regular rhythm,  normal heart sounds and intact distal pulses.  Exam reveals no gallop.   No murmur heard. Pulmonary/Chest: Effort normal and breath sounds normal. No respiratory distress. He has no wheezes. He has no rales.  Abdominal: Soft. There is no tenderness.  Genitourinary:       Mild left epididymal tenderness No hernia Prostate is slightly boggy and moderately tender  Musculoskeletal: He exhibits no edema and no tenderness.  Lymphadenopathy:    He has no cervical adenopathy.  Neurological: He is alert and oriented to person, place, and time.       President-- "Obama, Bush, Clinton" [id number] Recall 3/3  Psychiatric: He has a normal mood and affect. His behavior is normal. Judgment and thought content normal.          Assessment & Plan:

## 2011-07-26 NOTE — Assessment & Plan Note (Signed)
BP Readings from Last 3 Encounters:  07/26/11 130/70  06/28/11 122/78  04/21/11 148/78   Good control No specific Rx (though doxazosin for prostate may help)

## 2011-07-26 NOTE — Assessment & Plan Note (Signed)
1 week of symptoms Will treat with 3 weeks of levaquin (to cover sinus also)

## 2011-07-26 NOTE — Assessment & Plan Note (Signed)
Quiet on the medication Will continue PPI due to past Barrett's

## 2011-08-03 DIAGNOSIS — M1710 Unilateral primary osteoarthritis, unspecified knee: Secondary | ICD-10-CM | POA: Diagnosis not present

## 2011-08-03 DIAGNOSIS — L279 Dermatitis due to unspecified substance taken internally: Secondary | ICD-10-CM | POA: Diagnosis not present

## 2011-08-03 DIAGNOSIS — M19049 Primary osteoarthritis, unspecified hand: Secondary | ICD-10-CM | POA: Diagnosis not present

## 2011-08-03 DIAGNOSIS — M6530 Trigger finger, unspecified finger: Secondary | ICD-10-CM | POA: Diagnosis not present

## 2011-08-05 ENCOUNTER — Encounter: Payer: Medicare Other | Admitting: Internal Medicine

## 2011-09-27 ENCOUNTER — Other Ambulatory Visit (INDEPENDENT_AMBULATORY_CARE_PROVIDER_SITE_OTHER): Payer: Medicare Other

## 2011-09-27 DIAGNOSIS — E785 Hyperlipidemia, unspecified: Secondary | ICD-10-CM

## 2011-09-27 LAB — HEPATIC FUNCTION PANEL
ALT: 18 U/L (ref 0–53)
AST: 18 U/L (ref 0–37)
Alkaline Phosphatase: 37 U/L — ABNORMAL LOW (ref 39–117)
Bilirubin, Direct: 0.1 mg/dL (ref 0.0–0.3)
Total Bilirubin: 0.9 mg/dL (ref 0.3–1.2)

## 2011-09-27 LAB — LIPID PANEL
LDL Cholesterol: 137 mg/dL — ABNORMAL HIGH (ref 0–99)
Total CHOL/HDL Ratio: 5
VLDL: 22.8 mg/dL (ref 0.0–40.0)

## 2011-09-29 ENCOUNTER — Encounter: Payer: Self-pay | Admitting: *Deleted

## 2011-10-20 ENCOUNTER — Encounter: Payer: Self-pay | Admitting: Internal Medicine

## 2011-10-20 ENCOUNTER — Ambulatory Visit (INDEPENDENT_AMBULATORY_CARE_PROVIDER_SITE_OTHER)
Admission: RE | Admit: 2011-10-20 | Discharge: 2011-10-20 | Disposition: A | Discharge status: Home / Self Care | Payer: Medicare Other | Source: Ambulatory Visit | Attending: Internal Medicine | Admitting: Internal Medicine

## 2011-10-20 ENCOUNTER — Ambulatory Visit (INDEPENDENT_AMBULATORY_CARE_PROVIDER_SITE_OTHER): Payer: Medicare Other | Admitting: Internal Medicine

## 2011-10-20 VITALS — BP 128/80 | HR 86 | Temp 98.4°F | Ht 71.0 in | Wt 243.0 lb

## 2011-10-20 DIAGNOSIS — M5137 Other intervertebral disc degeneration, lumbosacral region: Secondary | ICD-10-CM | POA: Diagnosis not present

## 2011-10-20 DIAGNOSIS — M48061 Spinal stenosis, lumbar region without neurogenic claudication: Secondary | ICD-10-CM

## 2011-10-20 DIAGNOSIS — M19049 Primary osteoarthritis, unspecified hand: Secondary | ICD-10-CM | POA: Diagnosis not present

## 2011-10-20 DIAGNOSIS — M47817 Spondylosis without myelopathy or radiculopathy, lumbosacral region: Secondary | ICD-10-CM | POA: Diagnosis not present

## 2011-10-20 MED ORDER — LORAZEPAM 0.5 MG PO TABS: 0.5000 mg | ORAL_TABLET | Freq: Every evening | ORAL | Status: DC | PRN

## 2011-10-20 NOTE — Progress Notes (Signed)
Subjective:    Patient ID: [name], male    DOB: [birth year], 71 y.o.   MRN: [id number]  HPI Having bad symptoms of arthritis Has knot along flexor tendon right 3rd finger 24 hour per day pain in all MCP, PIP and DIP in thumb in right hand Left thumb and 2nd finger as well  Goes back for some time but pain now worse Esp bad in past 2 months Can't lift heavy things now Worse after gripping steering wheels On computer but no regular tool use (occ hoeing)  Tried aleve and motrin----only prn though (8-10 per week)--does help some Hasn't used tylenol (has only used for fever) No topical or other Rx  Current Outpatient Prescriptions on File Prior to Visit  Medication Sig Dispense Refill  . aspirin 81 MG tablet Take 81 mg by mouth daily.        Marland Kitchen doxazosin (CARDURA) 4 MG tablet Take 1 tablet (4 mg total) by mouth at bedtime.  90 tablet  3  . esomeprazole (NEXIUM) 40 MG capsule Take 1 capsule (40 mg total) by mouth daily before breakfast.  90 capsule  3  . finasteride (PROSCAR) 5 MG tablet Take 1 tablet (5 mg total) by mouth daily.  90 tablet  3  . fluticasone (FLONASE) 50 MCG/ACT nasal spray Place 2 sprays into the nose daily.  48 g  3  . LORazepam (ATIVAN) 0.5 MG tablet Take 0.5-1 mg by mouth at bedtime as needed.      . metroNIDAZOLE (METROGEL) 0.75 % gel Apply topically 2 (two) times daily.  45 g  3  . pravastatin (PRAVACHOL) 80 MG tablet Take 1 tablet (80 mg total) by mouth daily.  90 tablet  3  . DISCONTD: traZODone (DESYREL) 50 MG tablet Take 1-2 tablets (50-100 mg total) by mouth at bedtime as needed for sleep.  60 tablet  5    Allergies  Allergen Reactions  . Atorvastatin     REACTION: myalgias  . Simvastatin     REACTION: joint and stomach pain    Past Medical History  Diagnosis Date  . Allergy   . Diverticulitis   . GERD (gastroesophageal reflux disease)   . Hyperlipidemia   . Anxiety   . Hypertension   . Osteoarthritis   . Spinal stenosis   . BPH (benign  prostatic hypertrophy)   . Kidney stones     Past Surgical History  Procedure Date  . Tendon repair     Left knee tendon repair--1966  . Vasectomy     Vasectomy/spermatocele/?testicular cyst--1970  . Rotator cuff repair     R rotator cuff repair--1/00  Thurston Hole)  . Eye muscle surgery     L eye muscle surgery--2/01  . Cardiovascular stress test     negative  . Esophagogastroduodenoscopy     colon--6/04, Barrett's--7/05, Barrett's but no cancer--4/08  . Knee arthroscopy     7/ 09 Arthroscopy left knee--Dr Thurston Hole  . Joint replacement 1/12    left TKR    Family History  Problem Relation Age of Onset  . Hypertension Mother   . Cancer Mother     breast cancer  . Coronary artery disease Neg Hx   . Diabetes Neg Hx     History   Social History  . Marital Status: Married    Spouse Name: N/A    Number of Children: 3  . Years of Education: N/A   Occupational History  . retired    Social History Main Topics  .  Smoking status: Never Smoker   . Smokeless tobacco: Never Used  . Alcohol Use: No  . Drug Use: No  . Sexually Active: Not on file   Other Topics Concern  . Not on file   Social History Narrative   Has living willHealth care POA is wife, then oldest daughter RobinWould accept resuscitation attempts. No prolonged artifical ventilationNo tube feeds if cognitively unaware   Review of Systems Has crick in neck for 30 days Low back pain also--chronic with spinal stenosis Would like to see Joe Minder--chiropractor. Needs back x-ray    Objective:   Physical Exam  Constitutional: He appears well-developed and well-nourished. No distress.  Musculoskeletal:       Mild thickening in right PIPs without synovitis actively Left hand quiet Has knot along flexor tendon right 3rd---normal ROM still          Assessment & Plan:

## 2011-10-20 NOTE — Assessment & Plan Note (Signed)
Ready to try chiropractic Will check L-S films first

## 2011-10-20 NOTE — Assessment & Plan Note (Signed)
Increased symptoms Discussed pain med regimen Injection or hand surgeon if increased problems at flexor tendon right 3rd

## 2011-11-08 DIAGNOSIS — M79609 Pain in unspecified limb: Secondary | ICD-10-CM | POA: Diagnosis not present

## 2011-11-08 DIAGNOSIS — B351 Tinea unguium: Secondary | ICD-10-CM | POA: Diagnosis not present

## 2012-01-31 ENCOUNTER — Ambulatory Visit: Payer: Medicare Other | Admitting: Internal Medicine

## 2012-02-09 ENCOUNTER — Ambulatory Visit (INDEPENDENT_AMBULATORY_CARE_PROVIDER_SITE_OTHER): Payer: Medicare Other | Admitting: Internal Medicine

## 2012-02-09 ENCOUNTER — Encounter: Payer: Self-pay | Admitting: Internal Medicine

## 2012-02-09 VITALS — BP 150/90 | HR 71 | Temp 98.7°F | Wt 240.8 lb

## 2012-02-09 DIAGNOSIS — N400 Enlarged prostate without lower urinary tract symptoms: Secondary | ICD-10-CM | POA: Diagnosis not present

## 2012-02-09 DIAGNOSIS — K219 Gastro-esophageal reflux disease without esophagitis: Secondary | ICD-10-CM

## 2012-02-09 DIAGNOSIS — M19049 Primary osteoarthritis, unspecified hand: Secondary | ICD-10-CM

## 2012-02-09 DIAGNOSIS — I10 Essential (primary) hypertension: Secondary | ICD-10-CM

## 2012-02-09 NOTE — Assessment & Plan Note (Signed)
Feels better now He is convinced his carrot juice has helped his hands also

## 2012-02-09 NOTE — Assessment & Plan Note (Signed)
Voiding okay Last PSA normal No reason for urology evaluation unless he has voiding difficulty on his double therapy Lab Results  Component Value Date   PSA 1.53 06/28/2011   PSA 5.21* 12/12/2007   PSA 3.85 10/09/2006

## 2012-02-09 NOTE — Assessment & Plan Note (Signed)
Quiet on PPI Will continue due to history of Barretts

## 2012-02-09 NOTE — Progress Notes (Signed)
Subjective:    Patient ID: [name], male    DOB: [birth year], 71 y.o.   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 Occ urgency if he waits too long  No stomach troubles Continues on the PPI Current Outpatient Prescriptions on File Prior to Visit  Medication Sig Dispense Refill  . aspirin 81 MG tablet Take 81 mg by mouth daily.        Marland Kitchen doxazosin (CARDURA) 4 MG tablet Take 1 tablet (4 mg total) by mouth at bedtime.  90 tablet  3  . esomeprazole (NEXIUM) 40 MG capsule Take 1 capsule (40 mg total) by mouth daily before breakfast.  90 capsule  3  . finasteride (PROSCAR) 5 MG tablet Take 1 tablet (5 mg total) by mouth daily.  90 tablet  3  . fluticasone (FLONASE) 50 MCG/ACT nasal spray Place 2 sprays into the nose daily.  48 g  3  . LORazepam (ATIVAN) 0.5 MG tablet Take 1-2 tablets (0.5-1 mg total) by mouth at bedtime as needed.  60 tablet  0  . metroNIDAZOLE (METROGEL) 0.75 % gel Apply topically 2 (two) times daily.  45 g  3  . pravastatin (PRAVACHOL) 80 MG tablet Take 1 tablet (80 mg total) by mouth daily.  90 tablet  3  . traZODone (DESYREL) 50 MG tablet Take 50-100 mg by mouth at bedtime as needed.        Allergies  Allergen Reactions  . Atorvastatin     REACTION: myalgias  . Simvastatin     REACTION: joint and stomach pain    Past Medical History  Diagnosis  Date  . Allergy   . Diverticulitis   . GERD (gastroesophageal reflux disease)   . Hyperlipidemia   . Anxiety   . Hypertension   . Osteoarthritis   . Spinal stenosis   . BPH (benign prostatic hypertrophy)   . Kidney stones     Past Surgical History  Procedure Date  . Tendon repair     Left knee tendon repair--1966  . Vasectomy     Vasectomy/spermatocele/?testicular cyst--1970  . Rotator cuff repair     R rotator cuff repair--1/00  Thurston Hole)  . Eye muscle surgery     L eye muscle surgery--2/01  . Cardiovascular stress test     negative  . Esophagogastroduodenoscopy     colon--6/04, Barrett's--7/05, Barrett's but no cancer--4/08  . Knee arthroscopy     7/ 09 Arthroscopy left knee--Dr Thurston Hole  . Joint replacement 1/12    left TKR    Family History  Problem Relation Age of Onset  . Hypertension Mother   . Cancer Mother     breast cancer  . Coronary artery disease Neg Hx   . Diabetes Neg Hx     History   Social History  . Marital Status: Married    Spouse Name:  N/A    Number of Children: 3  . Years of Education: N/A   Occupational History  . retired    Social History Main Topics  . Smoking status: Never Smoker   . Smokeless tobacco: Never Used  . Alcohol Use: No  . Drug Use: No  . Sexually Active: Not on file   Other Topics Concern  . Not on file   Social History Narrative   Has living willHealth care POA is wife, then oldest daughter RobinWould accept resuscitation attempts. No prolonged artifical ventilationNo tube feeds if cognitively unaware   Review of Systems Some loose stool if he isn't right with his diet Appetite is fine     Objective:   Physical Exam  Constitutional: He appears well-developed and well-nourished. No distress.  Neck: Normal range of motion. Neck supple. No thyromegaly present.  Cardiovascular: Normal rate, regular rhythm, normal heart sounds and intact distal pulses.  Exam reveals no gallop.   No murmur  heard. Pulmonary/Chest: Effort normal and breath sounds normal. No respiratory distress. He has no wheezes. He has no rales.  Abdominal: Soft. There is no tenderness.  Musculoskeletal: He exhibits no edema and no tenderness.  Lymphadenopathy:    He has no cervical adenopathy.  Psychiatric: He has a normal mood and affect. His behavior is normal.          Assessment & Plan:

## 2012-02-09 NOTE — Assessment & Plan Note (Signed)
Has been doing well  Generally controlled Up slightly today but no reason for changes  BP Readings from Last 3 Encounters:  02/09/12 150/90  10/20/11 128/80  07/26/11 130/70

## 2012-02-10 ENCOUNTER — Ambulatory Visit: Payer: Medicare Other | Admitting: Internal Medicine

## 2012-02-28 DIAGNOSIS — B0052 Herpesviral keratitis: Secondary | ICD-10-CM | POA: Diagnosis not present

## 2012-03-01 DIAGNOSIS — B0052 Herpesviral keratitis: Secondary | ICD-10-CM | POA: Diagnosis not present

## 2012-03-14 DIAGNOSIS — D4959 Neoplasm of unspecified behavior of other genitourinary organ: Secondary | ICD-10-CM | POA: Diagnosis not present

## 2012-03-21 DIAGNOSIS — D4959 Neoplasm of unspecified behavior of other genitourinary organ: Secondary | ICD-10-CM | POA: Diagnosis not present

## 2012-03-21 DIAGNOSIS — N401 Enlarged prostate with lower urinary tract symptoms: Secondary | ICD-10-CM | POA: Diagnosis not present

## 2012-03-21 DIAGNOSIS — R972 Elevated prostate specific antigen [PSA]: Secondary | ICD-10-CM | POA: Diagnosis not present

## 2012-03-21 DIAGNOSIS — N138 Other obstructive and reflux uropathy: Secondary | ICD-10-CM | POA: Diagnosis not present

## 2012-03-21 DIAGNOSIS — N529 Male erectile dysfunction, unspecified: Secondary | ICD-10-CM | POA: Diagnosis not present

## 2012-05-21 ENCOUNTER — Other Ambulatory Visit: Payer: Self-pay | Admitting: *Deleted

## 2012-05-21 MED ORDER — ESOMEPRAZOLE MAGNESIUM 40 MG PO CPDR: 40.0000 mg | DELAYED_RELEASE_CAPSULE | Freq: Every day | ORAL | Status: DC

## 2012-06-11 ENCOUNTER — Other Ambulatory Visit: Payer: Self-pay | Admitting: *Deleted

## 2012-06-11 MED ORDER — DOXAZOSIN MESYLATE 4 MG PO TABS: 4.0000 mg | ORAL_TABLET | Freq: Every day | ORAL | Status: DC

## 2012-06-11 MED ORDER — FINASTERIDE 5 MG PO TABS: 5.0000 mg | ORAL_TABLET | Freq: Every day | ORAL | Status: DC

## 2012-07-04 ENCOUNTER — Ambulatory Visit (INDEPENDENT_AMBULATORY_CARE_PROVIDER_SITE_OTHER): Payer: Medicare Other | Admitting: Internal Medicine

## 2012-07-04 ENCOUNTER — Encounter: Payer: Self-pay | Admitting: Internal Medicine

## 2012-07-04 VITALS — BP 128/80 | HR 80 | Temp 98.0°F | Wt 237.0 lb

## 2012-07-04 DIAGNOSIS — J019 Acute sinusitis, unspecified: Secondary | ICD-10-CM | POA: Diagnosis not present

## 2012-07-04 MED ORDER — AMOXICILLIN 500 MG PO TABS: 1000.0000 mg | ORAL_TABLET | Freq: Two times a day (BID) | ORAL | Status: DC

## 2012-07-04 NOTE — Assessment & Plan Note (Signed)
Seems like a bacterial secondary infection at this point Will treat with amoxil OTC robitussin helps cough

## 2012-07-04 NOTE — Progress Notes (Signed)
Subjective:    Patient ID: [name], male    DOB: [birth year], 72 y.o.   MRN: [id number]  HPI Has his "semi annual sinus infection" Right head pain, earache, facial pressure ("like my head will explode") Coughing probably from PND---green mucus Nasal congestion--some help with fluticasone  Started about a week ago but now worsening Especially bad since 2 days ago Started emergency amoxil from the dentist Seems to be improving now  No fever Some SOB---only with exertion or with coughing fits (dizzy also) Has also used some tylenol  Current Outpatient Prescriptions on File Prior to Visit  Medication Sig Dispense Refill  . aspirin 81 MG tablet Take 81 mg by mouth daily.        Marland Kitchen doxazosin (CARDURA) 4 MG tablet Take 1 tablet (4 mg total) by mouth at bedtime.  90 tablet  3  . esomeprazole (NEXIUM) 40 MG capsule Take 1 capsule (40 mg total) by mouth daily before breakfast.  90 capsule  1  . finasteride (PROSCAR) 5 MG tablet Take 1 tablet (5 mg total) by mouth daily.  90 tablet  3  . fluticasone (FLONASE) 50 MCG/ACT nasal spray Place 2 sprays into the nose daily.  48 g  3  . pravastatin (PRAVACHOL) 80 MG tablet Take 1 tablet (80 mg total) by mouth daily.  90 tablet  3  . traZODone (DESYREL) 50 MG tablet Take 50-100 mg by mouth at bedtime as needed.       No current facility-administered medications on file prior to visit.    Allergies  Allergen Reactions  . Atorvastatin     REACTION: myalgias  . Simvastatin     REACTION: joint and stomach pain    Past Medical History  Diagnosis Date  . Allergy   . Diverticulitis   . GERD (gastroesophageal reflux disease)   . Hyperlipidemia   . Anxiety   . Hypertension   . Osteoarthritis   . Spinal stenosis   . BPH (benign prostatic hypertrophy)   . Kidney stones     Past Surgical History  Procedure Laterality Date  . Tendon repair      Left knee tendon repair--1966  . Vasectomy      Vasectomy/spermatocele/?testicular  cyst--1970  . Rotator cuff repair      R rotator cuff repair--1/00  Thurston Hole)  . Eye muscle surgery      L eye muscle surgery--2/01  . Cardiovascular stress test      negative  . Esophagogastroduodenoscopy      colon--6/04, Barrett's--7/05, Barrett's but no cancer--4/08  . Knee arthroscopy      7/ 09 Arthroscopy left knee--Dr Thurston Hole  . Joint replacement  1/12    left TKR    Family History  Problem Relation Age of Onset  . Hypertension Mother   . Cancer Mother     breast cancer  . Coronary artery disease Neg Hx   . Diabetes Neg Hx     History   Social History  . Marital Status: Married    Spouse Name: N/A    Number of Children: 3  . Years of Education: N/A   Occupational History  . retired    Social History Main Topics  . Smoking status: Never Smoker   . Smokeless tobacco: Never Used  . Alcohol Use: No  . Drug Use: No  . Sexually Active: Not on file   Other Topics Concern  . Not on file   Social History Narrative   Has living will  Health care POA is wife, then oldest daughter Zella Ball   Would accept resuscitation attempts. No prolonged artifical ventilation   No tube feeds if cognitively unaware   Review of Systems No rash No vomiting or diarrhea Still eating but appetite is off     Objective:   Physical Exam  Constitutional: He appears well-developed and well-nourished. No distress.  HENT:  Mouth/Throat: Oropharynx is clear and moist. No oropharyngeal exudate.  No sinus tenderness TMs normal Marked nasal inflammation  Neck: Normal range of motion. Neck supple.  Pulmonary/Chest: Effort normal and breath sounds normal. No respiratory distress. He has no wheezes. He has no rales.  Lymphadenopathy:    He has no cervical adenopathy.          Assessment & Plan:

## 2012-08-01 DIAGNOSIS — H2510 Age-related nuclear cataract, unspecified eye: Secondary | ICD-10-CM | POA: Diagnosis not present

## 2012-08-02 ENCOUNTER — Other Ambulatory Visit: Payer: Self-pay | Admitting: *Deleted

## 2012-08-02 MED ORDER — TRAZODONE HCL 50 MG PO TABS: 50.0000 mg | ORAL_TABLET | Freq: Every evening | ORAL | Status: DC | PRN

## 2012-08-02 NOTE — Telephone Encounter (Signed)
Last filled 04/18/2012

## 2012-08-02 NOTE — Telephone Encounter (Signed)
rx sent to pharmacy by e-script  

## 2012-08-02 NOTE — Telephone Encounter (Signed)
Okay to refill for a year 

## 2012-08-06 DIAGNOSIS — B351 Tinea unguium: Secondary | ICD-10-CM | POA: Diagnosis not present

## 2012-08-06 DIAGNOSIS — M79609 Pain in unspecified limb: Secondary | ICD-10-CM | POA: Diagnosis not present

## 2012-08-16 ENCOUNTER — Ambulatory Visit (INDEPENDENT_AMBULATORY_CARE_PROVIDER_SITE_OTHER): Payer: Medicare Other | Admitting: Internal Medicine

## 2012-08-16 ENCOUNTER — Encounter: Payer: Self-pay | Admitting: Internal Medicine

## 2012-08-16 VITALS — BP 130/88 | HR 74 | Temp 98.3°F | Wt 241.0 lb

## 2012-08-16 DIAGNOSIS — Z0000 Encounter for general adult medical examination without abnormal findings: Secondary | ICD-10-CM

## 2012-08-16 DIAGNOSIS — Z1331 Encounter for screening for depression: Secondary | ICD-10-CM | POA: Diagnosis not present

## 2012-08-16 DIAGNOSIS — E785 Hyperlipidemia, unspecified: Secondary | ICD-10-CM | POA: Diagnosis not present

## 2012-08-16 DIAGNOSIS — I10 Essential (primary) hypertension: Secondary | ICD-10-CM

## 2012-08-16 DIAGNOSIS — K2270 Barrett's esophagus without dysplasia: Secondary | ICD-10-CM | POA: Diagnosis not present

## 2012-08-16 DIAGNOSIS — N400 Enlarged prostate without lower urinary tract symptoms: Secondary | ICD-10-CM | POA: Diagnosis not present

## 2012-08-16 NOTE — Patient Instructions (Addendum)
You can try acetaminophen or over the counter capsaicin cream for your hand pain  DASH Diet The DASH diet stands for "Dietary Approaches to Stop Hypertension." It is a healthy eating plan that has been shown to reduce high blood pressure (hypertension) in as little as 14 days, while also possibly providing other significant health benefits. These other health benefits include reducing the risk of breast cancer after menopause and reducing the risk of type 2 diabetes, heart disease, colon cancer, and stroke. Health benefits also include weight loss and slowing kidney failure in patients with chronic kidney disease.  DIET GUIDELINES  Limit salt (sodium). Your diet should contain less than 1500 mg of sodium daily.  Limit refined or processed carbohydrates. Your diet should include mostly whole grains. Desserts and added sugars should be used sparingly.  Include small amounts of heart-healthy fats. These types of fats include nuts, oils, and tub margarine. Limit saturated and trans fats. These fats have been shown to be harmful in the body. CHOOSING FOODS  The following food groups are based on a 2000 calorie diet. See your Registered Dietitian for individual calorie needs. Grains and Grain Products (6 to 8 servings daily)  Eat More Often: Whole-wheat bread, brown rice, whole-grain or wheat pasta, quinoa, popcorn without added fat or salt (air popped).  Eat Less Often: White bread, white pasta, white rice, cornbread. Vegetables (4 to 5 servings daily)  Eat More Often: Fresh, frozen, and canned vegetables. Vegetables may be raw, steamed, roasted, or grilled with a minimal amount of fat.  Eat Less Often/Avoid: Creamed or fried vegetables. Vegetables in a cheese sauce. Fruit (4 to 5 servings daily)  Eat More Often: All fresh, canned (in natural juice), or frozen fruits. Dried fruits without added sugar. One hundred percent fruit juice ( cup [237 mL] daily).  Eat Less Often: Dried fruits with  added sugar. Canned fruit in light or heavy syrup. [employer], Fish, and Poultry (2 servings or less daily. One serving is 3 to 4 oz [85-114 g]).  Eat More Often: Ninety percent or leaner ground beef, tenderloin, sirloin. Round cuts of beef, chicken breast, [place] breast. All fish. Grill, bake, or broil your meat. Nothing should be fried.  Eat Less Often/Avoid: Fatty cuts of meat, [place], or chicken leg, thigh, or wing. Fried cuts of meat or fish. Dairy (2 to 3 servings)  Eat More Often: Low-fat or fat-free milk, low-fat plain or light yogurt, reduced-fat or part-skim cheese.  Eat Less Often/Avoid: Milk (whole, 2%).Whole milk yogurt. Full-fat cheeses. Nuts, Seeds, and Legumes (4 to 5 servings per week)  Eat More Often: All without added salt.  Eat Less Often/Avoid: Salted nuts and seeds, canned beans with added salt. Fats and Sweets (limited)  Eat More Often: Vegetable oils, tub margarines without trans fats, sugar-free gelatin. Mayonnaise and salad dressings.  Eat Less Often/Avoid: Coconut oils, palm oils, butter, stick margarine, cream, half and half, cookies, candy, pie. FOR MORE INFORMATION The Dash Diet Eating Plan: www.dashdiet.org Document Released: 04/14/2011 Document Revised: 07/18/2011 Document Reviewed: 04/14/2011 [facility] [place] Patient Information 2013 [place], [place].

## 2012-08-16 NOTE — Assessment & Plan Note (Signed)
Lab Results  Component Value Date   LDLCALC 137* 09/27/2011   No meds Will recheck

## 2012-08-16 NOTE — Progress Notes (Signed)
Subjective:    Patient ID: [name], male    DOB: 04/05/1941, 72 y.o.   MRN: [id number]  HPI Here for Medicare wellness visit and follow up No depression but some anhedonia. "Pity party" since wife spends 80-100 hours/week caring for elderly dad No falls Reviewed advanced directives Reviewed other physicians he sees Vision and hearing are okay UTD on imms Feels he wants to continue with PSA testing---just had it done at urologist Independent with ADLs and instrumental ADLs Goes to gym for toning----hasn't been doing aerobic. Plans to start walking. Non smoker and doesn't drink  Got over sinus infection Still hears some wheezing at times Cough to clear mucus---mild breathing problems Uses the flonase but rarely antihistamines  Still on dual therapy for prostate Satisfied with this Nocturia x 1 mostly (unless drinks a lot) No daytime problems  Stomach okay Some gas No heartburn No swallowing problems  Current Outpatient Prescriptions on File Prior to Visit  Medication Sig Dispense Refill  . aspirin 81 MG tablet Take 81 mg by mouth daily.        Marland Kitchen doxazosin (CARDURA) 4 MG tablet Take 1 tablet (4 mg total) by mouth at bedtime.  90 tablet  3  . esomeprazole (NEXIUM) 40 MG capsule Take 1 capsule (40 mg total) by mouth daily before breakfast.  90 capsule  1  . finasteride (PROSCAR) 5 MG tablet Take 1 tablet (5 mg total) by mouth daily.  90 tablet  3  . fluticasone (FLONASE) 50 MCG/ACT nasal spray Place 2 sprays into the nose daily.  48 g  3  . traZODone (DESYREL) 50 MG tablet Take 1-2 tablets (50-100 mg total) by mouth at bedtime as needed.  60 tablet  11   No current facility-administered medications on file prior to visit.    Allergies  Allergen Reactions  . Atorvastatin     REACTION: myalgias  . Simvastatin     REACTION: joint and stomach pain    Past Medical History  Diagnosis Date  . Allergy   . Diverticulitis   . GERD (gastroesophageal reflux disease)    . Hyperlipidemia   . Anxiety   . Hypertension   . Osteoarthritis   . Spinal stenosis   . BPH (benign prostatic hypertrophy)   . Kidney stones     Past Surgical History  Procedure Laterality Date  . Tendon repair      Left knee tendon repair--1966  . Vasectomy      Vasectomy/spermatocele/?testicular cyst--1970  . Rotator cuff repair      R rotator cuff repair--1/00  Thurston Hole)  . Eye muscle surgery      L eye muscle surgery--2/01  . Cardiovascular stress test      negative  . Esophagogastroduodenoscopy      colon--6/04, Barrett's--7/05, Barrett's but no cancer--4/08  . Knee arthroscopy      7/ 09 Arthroscopy left knee--Dr Thurston Hole  . Joint replacement  1/12    left TKR    Family History  Problem Relation Age of Onset  . Hypertension Mother   . Cancer Mother     breast cancer  . Coronary artery disease Neg Hx   . Diabetes Neg Hx     History   Social History  . Marital Status: Married    Spouse Name: N/A    Number of Children: 3  . Years of Education: N/A   Occupational History  . retired    Social History Main Topics  . Smoking status: Never Smoker   .  Smokeless tobacco: Never Used  . Alcohol Use: No  . Drug Use: No  . Sexually Active: Not on file   Other Topics Concern  . Not on file   Social History Narrative   Has living will   Health care POA is wife, then oldest daughter Zella Ball   Would accept resuscitation attempts. No prolonged artifical ventilation   No tube feeds if cognitively unaware   Review of Systems Weight is stable Sleeps well with the trazodone---only needs one Bowels are regular Notes he is shorter now---still gets back pain at times. Occ severe in left "sciatic"    Objective:   Physical Exam  Constitutional: He is oriented to person, place, and time. He appears well-developed and well-nourished. No distress.  HENT:  Mouth/Throat: Oropharynx is clear and moist. No oropharyngeal exudate.  Neck: Normal range of motion. Neck supple.  No thyromegaly present.  Cardiovascular: Normal rate, regular rhythm, normal heart sounds and intact distal pulses.  Exam reveals no gallop.   No murmur heard. Pulmonary/Chest: Effort normal and breath sounds normal. No respiratory distress. He has no wheezes. He has no rales.  Abdominal: Soft. There is no tenderness.  Musculoskeletal: He exhibits no edema and no tenderness.  Lymphadenopathy:    He has no cervical adenopathy.  Neurological: He is alert and oriented to person, place, and time.  Phillips Grout" [id number] Recall 3/3  Psychiatric: He has a normal mood and affect. His behavior is normal.          Assessment & Plan:

## 2012-08-16 NOTE — Assessment & Plan Note (Signed)
I have personally reviewed the Medicare Annual Wellness questionnaire and have noted 1. The patient's medical and social history 2. Their use of alcohol, tobacco or illicit drugs 3. Their current medications and supplements 4. The patient's functional ability including ADL's, fall risks, home safety risks and hearing or visual             impairment. 5. Diet and physical activities 6. Evidence for depression or mood disorders  The patients weight, height, BMI and visual acuity have been recorded in the chart I have made referrals, counseling and provided education to the patient based review of the above and I have provided the pt with a written personalized care plan for preventive services.  I have provided you with a copy of your personalized plan for preventive services. Please take the time to review along with your updated medication list.  UTD on imms and colon Had PSA already---probably wants them till 84 Gave dietary information

## 2012-08-16 NOTE — Assessment & Plan Note (Signed)
Voids well on the dual therapy

## 2012-08-16 NOTE — Assessment & Plan Note (Signed)
No symptoms Continue PPI

## 2012-08-16 NOTE — Assessment & Plan Note (Signed)
BP Readings from Last 3 Encounters:  08/16/12 130/88  07/04/12 128/80  02/09/12 150/90   Good control Due for labs

## 2012-08-17 LAB — HEPATIC FUNCTION PANEL
AST: 21 U/L (ref 0–37)
Total Bilirubin: 0.7 mg/dL (ref 0.3–1.2)

## 2012-08-17 LAB — LIPID PANEL
Cholesterol: 208 mg/dL — ABNORMAL HIGH (ref 0–200)
HDL: 32.8 mg/dL — ABNORMAL LOW (ref >39.00)
Triglycerides: 115 mg/dL (ref 0.0–149.0)
VLDL: 23 mg/dL (ref 0.0–40.0)

## 2012-08-17 LAB — CBC WITH DIFFERENTIAL/PLATELET
Basophils Absolute: 0 10*3/uL (ref 0.0–0.1)
Eosinophils Relative: 6 % — ABNORMAL HIGH (ref 0.0–5.0)
Monocytes Absolute: 0.5 10*3/uL (ref 0.1–1.0)
Monocytes Relative: 6.5 % (ref 3.0–12.0)
Neutrophils Relative %: 68 % (ref 43.0–77.0)
Platelets: 189 10*3/uL (ref 150.0–400.0)
RDW: 13.4 % (ref 11.5–14.6)
WBC: 7.2 10*3/uL (ref 4.5–10.5)

## 2012-08-17 LAB — BASIC METABOLIC PANEL
BUN: 15 mg/dL (ref 6–23)
Creatinine, Ser: 0.9 mg/dL (ref 0.4–1.5)
GFR: 86.16 mL/min (ref >60.00)

## 2012-08-17 LAB — LDL CHOLESTEROL, DIRECT: Direct LDL: 157.4 mg/dL

## 2012-08-20 LAB — TSH: TSH: 1.24 u[IU]/mL (ref 0.35–5.50)

## 2012-08-21 ENCOUNTER — Encounter: Payer: Self-pay | Admitting: *Deleted

## 2012-09-03 ENCOUNTER — Encounter: Payer: Medicare Other | Admitting: Internal Medicine

## 2012-09-03 ENCOUNTER — Other Ambulatory Visit: Payer: Medicare Other

## 2012-09-12 ENCOUNTER — Ambulatory Visit (INDEPENDENT_AMBULATORY_CARE_PROVIDER_SITE_OTHER): Payer: Medicare Other | Admitting: Internal Medicine

## 2012-09-12 ENCOUNTER — Encounter: Payer: Self-pay | Admitting: Internal Medicine

## 2012-09-12 VITALS — BP 130/80 | HR 72 | Temp 98.2°F | Wt 238.0 lb

## 2012-09-12 DIAGNOSIS — M5416 Radiculopathy, lumbar region: Secondary | ICD-10-CM | POA: Insufficient documentation

## 2012-09-12 DIAGNOSIS — IMO0002 Reserved for concepts with insufficient information to code with codable children: Secondary | ICD-10-CM | POA: Diagnosis not present

## 2012-09-12 MED ORDER — PREDNISONE 20 MG PO TABS: 40.0000 mg | ORAL_TABLET | Freq: Every day | ORAL | Status: DC

## 2012-09-12 NOTE — Progress Notes (Signed)
Subjective:    Patient ID: [name], male    DOB: 15-Oct-1940, 72 y.o.   MRN: [id number]  HPI Having increased pain in left low back---radiating down to buttock and knee Now noting some swelling in replaced left knee Has affected his walking Ibuprofen 800mg  every 4 hours--not helping Tried to contact surgeon-- Dr Meredith Pel won't see him without a MRI and new referral  Has noted it for a while but now worse Has tried walking---gets in terrible pain fairly quickly (less than 1/2 mile) Gets stretched by trainer at gym--stopped after recent trip -- and he worsened  No discrete weakness---just pain Some numbness and tingling around left knee  Current Outpatient Prescriptions on File Prior to Visit  Medication Sig Dispense Refill  . aspirin 81 MG tablet Take 81 mg by mouth daily.        Marland Kitchen doxazosin (CARDURA) 4 MG tablet Take 1 tablet (4 mg total) by mouth at bedtime.  90 tablet  3  . esomeprazole (NEXIUM) 40 MG capsule Take 1 capsule (40 mg total) by mouth daily before breakfast.  90 capsule  1  . finasteride (PROSCAR) 5 MG tablet Take 1 tablet (5 mg total) by mouth daily.  90 tablet  3  . fluticasone (FLONASE) 50 MCG/ACT nasal spray Place 2 sprays into the nose daily.  48 g  3  . traZODone (DESYREL) 50 MG tablet Take 1-2 tablets (50-100 mg total) by mouth at bedtime as needed.  60 tablet  11   No current facility-administered medications on file prior to visit.    Allergies  Allergen Reactions  . Atorvastatin     REACTION: myalgias  . Simvastatin     REACTION: joint and stomach pain    Past Medical History  Diagnosis Date  . Allergy   . Diverticulitis   . GERD (gastroesophageal reflux disease)   . Hyperlipidemia   . Anxiety   . Hypertension   . Osteoarthritis   . Spinal stenosis   . BPH (benign prostatic hypertrophy)   . Kidney stones     Past Surgical History  Procedure Laterality Date  . Tendon repair      Left knee tendon repair--1966  . Vasectomy     Vasectomy/spermatocele/?testicular cyst--1970  . Rotator cuff repair      R rotator cuff repair--1/00  Thurston Hole)  . Eye muscle surgery      L eye muscle surgery--2/01  . Cardiovascular stress test      negative  . Esophagogastroduodenoscopy      colon--6/04, Barrett's--7/05, Barrett's but no cancer--4/08  . Knee arthroscopy      7/ 09 Arthroscopy left knee--Dr Thurston Hole  . Joint replacement  1/12    left TKR  . Lipoma excision Left 2010    compressing nerve by spine---[facility] Fredda [facility]med [facility] History  Problem Relation Age of Onset  . Hypertension Mother   . Cancer Mother     breast cancer  . Coronary artery disease Neg Hx   . Diabetes Neg Hx     History   Social History  . Marital Status: Married    Spouse Name: N/A    Number of Children: 3  . Years of Education: N/A   Occupational History  . retired    Social History Main Topics  . Smoking status: Never Smoker   . Smokeless tobacco: Never Used  . Alcohol Use: No  . Drug Use: No  . Sexually Active: Not on file  Other Topics Concern  . Not on file   Social History Narrative   Has living will   Health care POA is wife, then oldest daughter Zella Ball   Would accept resuscitation attempts. No prolonged artifical ventilation   No tube feeds if cognitively unaware   Review of Systems Sleeping is improved  Appetite is good---trying to lose weight with "diet"---- cereal with fruit, chicken with salad, etc    Objective:   Physical Exam  Constitutional: He appears well-developed and well-nourished. No distress.  Musculoskeletal:  No spine or hip tenderness Flexion of back to about 60-70 degrees SLR negative bilaterally  Neurological:  Normal strength in legs Gait is small stepped but not antalgic Faint but symmetric reflexes          Assessment & Plan:

## 2012-09-12 NOTE — Assessment & Plan Note (Signed)
L5 or S1 radicular pain No knee findings No bowel or bladder changes Normal strength  No worrisome symptoms now Will try course of prednisone He will get back to the stretching he thinks helped Referral for physiatry if ongoing pain

## 2012-09-12 NOTE — Patient Instructions (Signed)
Please try the prednisone and get back to the stretching that you feel has helped you. If you are not improved after 2 weeks, call for the referral for the physiatrist

## 2012-09-13 ENCOUNTER — Encounter: Payer: Self-pay | Admitting: Gastroenterology

## 2012-11-16 ENCOUNTER — Other Ambulatory Visit: Payer: Self-pay

## 2012-11-16 MED ORDER — ESOMEPRAZOLE MAGNESIUM 40 MG PO CPDR: 40.0000 mg | DELAYED_RELEASE_CAPSULE | Freq: Every day | ORAL | Status: DC

## 2012-11-16 NOTE — Telephone Encounter (Signed)
Pt request refill nexium to [facility]. Advised pt done.

## 2012-12-18 ENCOUNTER — Other Ambulatory Visit: Payer: Self-pay

## 2012-12-18 MED ORDER — LORAZEPAM 0.5 MG PO TABS
0.5000 mg | ORAL_TABLET | Freq: Every evening | ORAL | Status: DC | PRN
Start: 2012-12-18 — End: 2013-02-14

## 2012-12-18 NOTE — Telephone Encounter (Signed)
Pt left v/m requesting refill lorazepam to [facility]; pt going on vacation end of week and request refill done.Please advise.

## 2012-12-18 NOTE — Telephone Encounter (Signed)
Has not gotten in some time Okay #60 x 0

## 2012-12-18 NOTE — Telephone Encounter (Signed)
rx called into pharmacy

## 2013-01-10 DIAGNOSIS — H6120 Impacted cerumen, unspecified ear: Secondary | ICD-10-CM | POA: Diagnosis not present

## 2013-01-10 DIAGNOSIS — H903 Sensorineural hearing loss, bilateral: Secondary | ICD-10-CM | POA: Diagnosis not present

## 2013-01-14 DIAGNOSIS — M19019 Primary osteoarthritis, unspecified shoulder: Secondary | ICD-10-CM | POA: Diagnosis not present

## 2013-01-14 DIAGNOSIS — M25569 Pain in unspecified knee: Secondary | ICD-10-CM | POA: Diagnosis not present

## 2013-01-21 ENCOUNTER — Encounter: Payer: Medicare Other | Admitting: Internal Medicine

## 2013-01-30 ENCOUNTER — Other Ambulatory Visit: Payer: Self-pay

## 2013-01-30 MED ORDER — FLUTICASONE PROPIONATE 50 MCG/ACT NA SUSP: 2.0000 | Freq: Every day | NASAL | Status: DC

## 2013-01-30 NOTE — Telephone Encounter (Signed)
Pt left v/m requesting 90 day refill to [facility]. Mrs Tuzzolino notified refill done.

## 2013-02-14 ENCOUNTER — Other Ambulatory Visit: Payer: Self-pay | Admitting: *Deleted

## 2013-02-14 MED ORDER — LORAZEPAM 0.5 MG PO TABS: 0.5000 mg | ORAL_TABLET | Freq: Every evening | ORAL | Status: DC | PRN

## 2013-02-14 NOTE — Telephone Encounter (Signed)
rx called into pharmacy

## 2013-02-14 NOTE — Telephone Encounter (Signed)
Okay #60 x 0 

## 2013-02-14 NOTE — Telephone Encounter (Signed)
Last filled 12/18/12

## 2013-02-19 ENCOUNTER — Ambulatory Visit (INDEPENDENT_AMBULATORY_CARE_PROVIDER_SITE_OTHER): Payer: Medicare Other | Admitting: Family Medicine

## 2013-02-19 ENCOUNTER — Encounter: Payer: Self-pay | Admitting: Family Medicine

## 2013-02-19 VITALS — BP 132/80 | HR 73 | Temp 98.4°F | Ht 71.0 in | Wt 240.2 lb

## 2013-02-19 DIAGNOSIS — J019 Acute sinusitis, unspecified: Secondary | ICD-10-CM | POA: Diagnosis not present

## 2013-02-19 DIAGNOSIS — J0110 Acute frontal sinusitis, unspecified: Secondary | ICD-10-CM | POA: Insufficient documentation

## 2013-02-19 MED ORDER — AMOXICILLIN 500 MG PO CAPS: 1000.0000 mg | ORAL_CAPSULE | Freq: Two times a day (BID) | ORAL | Status: DC

## 2013-02-19 NOTE — Assessment & Plan Note (Signed)
Typical of pt recurrent sinus infections. Treat with amox x 10 days... Also mucolytic, nasal steroid and saline irrigation.

## 2013-02-19 NOTE — Progress Notes (Signed)
  Subjective:    Patient ID: [name], male    DOB: 02/26/1941, 72 y.o.   MRN: [id number]  Sinusitis This is a new problem. The current episode started in the past 7 days. The problem has been gradually worsening since onset. There has been no fever (subjective low grade). The pain is severe. Associated symptoms include coughing, ear pain, headaches, shortness of breath and sinus pressure. Pertinent negatives include no sore throat. (Blowing purulent mucus. Always gets this yearly.  right ear pain) Treatments tried: OTC cough syrup, max strength, also using flonase. The treatment provided moderate relief.  Cough This is a new problem. The problem has been gradually worsening. The problem occurs constantly. The cough is productive of sputum. Associated symptoms include ear pain, headaches and shortness of breath. Pertinent negatives include no sore throat. Risk factors: non smoker. His past medical history is significant for environmental allergies. There is no history of asthma, bronchiectasis, COPD or emphysema. 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      Review of Systems  HENT: Positive for ear pain and sinus pressure. Negative for sore throat.   Respiratory: Positive for cough and shortness of breath.   Allergic/Immunologic: Positive for environmental allergies.  Neurological: Positive for headaches.       Objective:   Physical Exam  Constitutional: Vital signs are normal. He appears well-developed and well-nourished.  Non-toxic appearance. He does not appear ill. No distress.  HENT:  Head: Normocephalic and atraumatic.  Right Ear: Hearing, external ear and ear canal normal. No tenderness. No foreign bodies. Tympanic membrane is not retracted and not bulging. A middle ear effusion is present.  Left Ear: Hearing, external ear and ear canal normal. No tenderness. No foreign bodies. Tympanic membrane is not retracted and not bulging. A middle ear effusion is present.   Nose: No mucosal edema or rhinorrhea. Right sinus exhibits maxillary sinus tenderness and frontal sinus tenderness. Left sinus exhibits maxillary sinus tenderness and frontal sinus tenderness.  Mouth/Throat: Uvula is midline, oropharynx is clear and moist and mucous membranes are normal. Normal dentition. No dental caries. No oropharyngeal exudate or tonsillar abscesses.  Eyes: Conjunctivae, EOM and lids are normal. Pupils are equal, round, and reactive to light. Lids are everted and swept, no foreign bodies found.  Neck: Trachea normal, normal range of motion and phonation normal. Neck supple. Carotid bruit is not present. No mass and no thyromegaly present.  Cardiovascular: Normal rate, regular rhythm, S1 normal, S2 normal, normal heart sounds, intact distal pulses and normal pulses.  Exam reveals no gallop.   No murmur heard. Pulmonary/Chest: Effort normal and breath sounds normal. No respiratory distress. He has no wheezes. He has no rhonchi. He has no rales.  Abdominal: Soft. Normal appearance and bowel sounds are normal. There is no hepatosplenomegaly. There is no tenderness. There is no rebound, no guarding and no CVA tenderness. No hernia.  Neurological: He is alert. He has normal reflexes.  Skin: Skin is warm, dry and intact. No rash noted.  Psychiatric: He has a normal mood and affect. His speech is normal and behavior is normal. Judgment normal.          Assessment & Plan:

## 2013-02-19 NOTE — Patient Instructions (Signed)
Start antibiotics. Nasal saline irrigation, flonase 2 sprays per nostril daily, mucinex DM. Call if not improving as expected.

## 2013-03-19 ENCOUNTER — Ambulatory Visit: Payer: Medicare Other | Admitting: Internal Medicine

## 2013-03-25 DIAGNOSIS — R972 Elevated prostate specific antigen [PSA]: Secondary | ICD-10-CM | POA: Diagnosis not present

## 2013-03-25 DIAGNOSIS — N529 Male erectile dysfunction, unspecified: Secondary | ICD-10-CM | POA: Diagnosis not present

## 2013-03-25 DIAGNOSIS — N138 Other obstructive and reflux uropathy: Secondary | ICD-10-CM | POA: Diagnosis not present

## 2013-03-25 DIAGNOSIS — N401 Enlarged prostate with lower urinary tract symptoms: Secondary | ICD-10-CM | POA: Diagnosis not present

## 2013-03-25 DIAGNOSIS — N139 Obstructive and reflux uropathy, unspecified: Secondary | ICD-10-CM | POA: Diagnosis not present

## 2013-04-12 ENCOUNTER — Ambulatory Visit (INDEPENDENT_AMBULATORY_CARE_PROVIDER_SITE_OTHER): Payer: Medicare Other | Admitting: Internal Medicine

## 2013-04-12 ENCOUNTER — Encounter: Payer: Self-pay | Admitting: Internal Medicine

## 2013-04-12 VITALS — BP 132/86 | HR 75 | Temp 98.0°F | Wt 243.0 lb

## 2013-04-12 DIAGNOSIS — G479 Sleep disorder, unspecified: Secondary | ICD-10-CM | POA: Diagnosis not present

## 2013-04-12 DIAGNOSIS — R0683 Snoring: Secondary | ICD-10-CM | POA: Insufficient documentation

## 2013-04-12 MED ORDER — LORAZEPAM 0.5 MG PO TABS: 0.5000 mg | ORAL_TABLET | Freq: Every evening | ORAL | Status: DC | PRN

## 2013-04-12 NOTE — Progress Notes (Signed)
Subjective:    Patient ID: [name], male    DOB: 12-26-1940, 72 y.o.   MRN: [id number]  HPI For past 45 days, "I am exhaused by 5 PM" Out immediately when he hits the bed Bad snoring problem Will sleep 4 hours solid, then up at 2PM to void---then inconsistent about getting back to sleep Wheezes in AM-- or feels something is in his throat Will cough up mucous for a couple of hours in AM--then resolves Will awaken with saliva in throat  Awakens refreshed at [facility] all day without a problem--then gets home at 5PM "and has a melt down" Wife sleeps in different room--no clear apnea or gasping when they slept together  Notes SOB still Relates to frozen diaphragm Not really much different Still goes to gym---not really aerobic though  Current Outpatient Prescriptions on File Prior to Visit  Medication Sig Dispense Refill  . aspirin 81 MG tablet Take 81 mg by mouth daily.        Marland Kitchen doxazosin (CARDURA) 4 MG tablet Take 1 tablet (4 mg total) by mouth at bedtime.  90 tablet  3  . esomeprazole (NEXIUM) 40 MG capsule Take 1 capsule (40 mg total) by mouth daily before breakfast.  90 capsule  1  . finasteride (PROSCAR) 5 MG tablet Take 1 tablet (5 mg total) by mouth daily.  90 tablet  3  . fluticasone (FLONASE) 50 MCG/ACT nasal spray Place 2 sprays into the nose daily.  48 g  2  . LORazepam (ATIVAN) 0.5 MG tablet Take 1-2 tablets (0.5-1 mg total) by mouth at bedtime as needed.  60 tablet  0   No current facility-administered medications on file prior to visit.    Allergies  Allergen Reactions  . Atorvastatin     REACTION: myalgias  . Simvastatin     REACTION: joint and stomach pain    Past Medical History  Diagnosis Date  . Allergy   . Diverticulitis   . GERD (gastroesophageal reflux disease)   . Hyperlipidemia   . Anxiety   . Hypertension   . Osteoarthritis   . Spinal stenosis   . BPH (benign prostatic hypertrophy)   . Kidney stones     Past Surgical History    Procedure Laterality Date  . Tendon repair      Left knee tendon repair--1966  . Vasectomy      Vasectomy/spermatocele/?testicular cyst--1970  . Rotator cuff repair      R rotator cuff repair--1/00  [name])  . Eye muscle surgery      L eye muscle surgery--2/01  . Cardiovascular stress test      negative  . Esophagogastroduodenoscopy      colon--6/04, Barrett's--7/05, Barrett's but no cancer--4/08  . Knee arthroscopy      7/ 09 Arthroscopy left knee--Dr [name]  . Joint replacement  1/12    left TKR  . Lipoma excision Left 2010    compressing nerve by spine---[facility]da [facility] at [facility] History  Problem Relation Age of Onset  . Hypertension Mother   . Cancer Mother     breast cancer  . Coronary artery disease Neg Hx   . Diabetes Neg Hx     History   Social History  . Marital Status: Married    Spouse Name: N/A    Number of Children: 3  . Years of Education: N/A   Occupational History  . retired    Social History Main Topics  . Smoking  status: Never Smoker   . Smokeless tobacco: Never Used  . Alcohol Use: No  . Drug Use: No  . Sexual Activity: Not on file   Other Topics Concern  . Not on file   Social History Narrative   Has living will   Health care POA is wife, then oldest daughter [name]   Would accept resuscitation attempts. No prolonged artifical ventilation   No tube feeds if cognitively unaware   Review of Systems Known acid reflux with Barretts----symptoms controlled with meds Did elevate bed for a while---but wife didn't like it    Objective:   Physical Exam  Constitutional: He appears well-developed and well-nourished. No distress.  HENT:  Has fairly good airway  Neck: Normal range of motion. Neck supple. No thyromegaly present.  Cardiovascular: Normal rate, regular rhythm and normal heart sounds.  Exam reveals no gallop.   No murmur heard. Pulmonary/Chest: Effort normal and breath sounds normal. No respiratory distress. He has no  wheezes. He has no rales.  Abdominal: Soft. There is no tenderness.  Musculoskeletal: He exhibits no edema and no tenderness.  Lymphadenopathy:    He has no cervical adenopathy.  Psychiatric: He has a normal mood and affect. His behavior is normal.          Assessment & Plan:

## 2013-04-12 NOTE — Progress Notes (Signed)
Pre-visit discussion using our clinic review tool. No additional management support is needed unless otherwise documented below in the visit note.  

## 2013-04-12 NOTE — Assessment & Plan Note (Signed)
Doesn't sleep well despite the lorazepam Trazodone didn't work  Wife has left room Awakens with secretions ?apnea Somnolence by late afternoon---can't keep up  Will have sleep evaluation If not apnea, may need to try another agent to promote better sleep

## 2013-05-22 ENCOUNTER — Encounter: Payer: Self-pay | Admitting: Pulmonary Disease

## 2013-05-22 ENCOUNTER — Ambulatory Visit (INDEPENDENT_AMBULATORY_CARE_PROVIDER_SITE_OTHER): Payer: Medicare Other | Admitting: Pulmonary Disease

## 2013-05-22 VITALS — BP 130/80 | HR 81 | Temp 97.8°F | Ht 69.0 in | Wt 239.6 lb

## 2013-05-22 DIAGNOSIS — R0609 Other forms of dyspnea: Secondary | ICD-10-CM | POA: Diagnosis not present

## 2013-05-22 DIAGNOSIS — R0683 Snoring: Secondary | ICD-10-CM

## 2013-05-22 DIAGNOSIS — R0989 Other specified symptoms and signs involving the circulatory and respiratory systems: Secondary | ICD-10-CM

## 2013-05-22 NOTE — Patient Instructions (Signed)
Take the next 82mos and work on weight loss.  Also have your wife observe your sleep to see if she sees an abnormal breathing pattern. If you are not improving, please call and we can arrange a sleep study for evaluation.

## 2013-05-22 NOTE — Progress Notes (Signed)
Subjective:    Patient ID: [name], male    DOB: 1940-08-10, 73 y.o.   MRN: [id number]  HPI The patient is a 73 year old male who I've been asked to see for possible obstructive sleep apnea. He has been noted to have loud snoring by his wife, and she has moved to a different room to sleep. Subsequently, she has never commented on an abnormal breathing pattern during sleep, and the patient has never had gasping episodes during the night. He awakens at least 2 times a night, and feels unrested about 40% of the mornings. He does very well be other mornings. He denies any sleepiness during the day, even with inactivity. However he does note significant fatigue that occurs by late afternoon. His Epworth score today is only 3. He has no issues in the evenings with sleepiness watching television or movies, and has no sleepiness with driving. He tells me that his weight is up 5 pounds over the last 2 years.   Sleep Questionnaire What time do you typically go to bed?( Between what hours) [id number] 02-1029 at 1340 on 05/22/13 by Virl Cagey, CMA How long does it take you to fall asleep? 52mins 71mins at 1340 on 05/22/13 by Virl Cagey, CMA How many times during the night do you wake up? 2 2 at 1340 on 05/22/13 by Virl Cagey, CMA What time do you get out of bed to start your day? No Value 6-7am at 1340 on 05/22/13 by Virl Cagey, CMA Do you drive or operate heavy machinery in your occupation? No No at 1340 on 05/22/13 by Virl Cagey, CMA How much has your weight changed (up or down) over the past two years? (In pounds) 5 lb (2.268 kg) 5 lb (2.268 kg) at 1340 on 05/22/13 by Virl Cagey, CMA Have you ever had a sleep study before? Yes Yes at 1340 on 05/22/13 by Virl Cagey, CMA If yes, location of study? unsure unsure at 1340 on 05/22/13 by Virl Cagey, CMA If yes, date of study? 15+yr ago 15+yr ago at 1340 on 05/22/13 by Virl Cagey, CMA Do you currently use  CPAP? No No at 1340 on 05/22/13 by Virl Cagey, CMA Do you wear oxygen at any time? No No at 1340 on 05/22/13 by Virl Cagey, CMA   Review of Systems  Constitutional: Negative for fever and unexpected weight change.  HENT: Positive for congestion. Negative for dental problem, ear pain, nosebleeds, postnasal drip, rhinorrhea, sinus pressure, sneezing, sore throat and trouble swallowing.   Eyes: Negative for redness and itching.  Respiratory: Positive for cough and shortness of breath. Negative for chest tightness and wheezing.   Cardiovascular: Negative for palpitations and leg swelling.  Gastrointestinal: Negative for nausea and vomiting.  Genitourinary: Negative for dysuria.  Musculoskeletal: Positive for joint swelling.  Skin: Negative for rash.  Neurological: Negative for headaches.  Hematological: Does not bruise/bleed easily.  Psychiatric/Behavioral: Negative for dysphoric mood. The patient is not nervous/anxious.        Objective:   Physical Exam Constitutional:  Overweight male, no acute distress  HENT:  Nares patent without discharge  Oropharynx without exudate, palate and uvula are mildly elongated.   Eyes:  Perrla, eomi, no scleral icterus  Neck:  No JVD, no TMG  Cardiovascular:  Normal rate, regular rhythm, no rubs or gallops.  1/6 sem        Intact distal pulses  Pulmonary :  Normal breath sounds,  no stridor or respiratory distress   No rales, rhonchi, or wheezing  Abdominal:  Soft, nondistended, bowel sounds present.  No tenderness noted.   Musculoskeletal:  No lower extremity edema noted.  Lymph Nodes:  No cervical lymphadenopathy noted  Skin:  No cyanosis noted  Neurologic:  Alert, appropriate, moves all 4 extremities without obvious deficit.         Assessment & Plan:

## 2013-05-22 NOTE — Assessment & Plan Note (Signed)
The pt has a history of snoring, as well as intermittant nonrestorative sleep.  However, he is not having true daytime sleepiness but rather late afternoon fatigue.  It is unclear if he has sleep apnea, but I suspect it is very mild even if he does have it.  I have discussed with him proceeding with a sleep study now to put the issue to rest, versus taking the next 6 months and work aggressively on weight loss. After a long discussion, the patient decided to try for weight loss approach first. I've also asked him to have his wife observe his sleep to see if he has an abnormal breathing pattern. The patient will call if he is more symptomatic than he initially conveyed to me, and if he is unable to make strides with weight loss over the next 6 months.

## 2013-06-03 ENCOUNTER — Other Ambulatory Visit: Payer: Self-pay

## 2013-06-03 MED ORDER — ESOMEPRAZOLE MAGNESIUM 40 MG PO CPDR: 40.0000 mg | DELAYED_RELEASE_CAPSULE | Freq: Every day | ORAL | Status: DC

## 2013-06-05 DIAGNOSIS — M19019 Primary osteoarthritis, unspecified shoulder: Secondary | ICD-10-CM | POA: Diagnosis not present

## 2013-06-19 ENCOUNTER — Ambulatory Visit (INDEPENDENT_AMBULATORY_CARE_PROVIDER_SITE_OTHER): Payer: Medicare Other | Admitting: Internal Medicine

## 2013-06-19 ENCOUNTER — Encounter: Payer: Self-pay | Admitting: Internal Medicine

## 2013-06-19 VITALS — BP 132/74 | HR 58 | Temp 97.6°F | Wt 230.8 lb

## 2013-06-19 DIAGNOSIS — J019 Acute sinusitis, unspecified: Secondary | ICD-10-CM

## 2013-06-19 DIAGNOSIS — M67919 Unspecified disorder of synovium and tendon, unspecified shoulder: Secondary | ICD-10-CM | POA: Diagnosis not present

## 2013-06-19 DIAGNOSIS — M719 Bursopathy, unspecified: Secondary | ICD-10-CM | POA: Diagnosis not present

## 2013-06-19 MED ORDER — AMOXICILLIN 500 MG PO CAPS: 500.0000 mg | ORAL_CAPSULE | Freq: Two times a day (BID) | ORAL | Status: DC

## 2013-06-19 NOTE — Progress Notes (Signed)
HPI: Pt presents today with complaints of nasal congestion, with thick yellow mucosal discharge. Pt also endorses productive cough, facial pain, sinus pressure, and ear fullness. [name] denies sore throat, difficulty breathing, or chest pain. Pt tried OTC antihistamine last night and has been using his prescribed Flonase regularly.     Review of Systems    Past Medical History  Diagnosis Date  . Allergy   . Diverticulitis   . GERD (gastroesophageal reflux disease)   . Hyperlipidemia   . Anxiety   . Hypertension   . Osteoarthritis   . Spinal stenosis   . BPH (benign prostatic hypertrophy)   . Kidney stones     Family History  Problem Relation Age of Onset  . Hypertension Mother   . Cancer Mother     breast cancer  . Coronary artery disease Neg Hx   . Diabetes Neg Hx     History   Social History  . Marital Status: Married    Spouse Name: N/A    Number of Children: 3  . Years of Education: N/A   Occupational History  . retired    Social History Main Topics  . Smoking status: Never Smoker   . Smokeless tobacco: Never Used  . Alcohol Use: Yes     Comment: occassional wine  . Drug Use: No  . Sexual Activity: Not on file   Other Topics Concern  . Not on file   Social History Narrative   Has living will   Health care POA is wife, then oldest daughter [name]   Would accept resuscitation attempts. No prolonged artifical ventilation   No tube feeds if cognitively unaware    Allergies  Allergen Reactions  . Atorvastatin     REACTION: myalgias  . Simvastatin     REACTION: joint and stomach pain     Constitutional: Denies headache, fatigue and fever. Denies abrupt weight changes.  HEENT:  Positive eye pain, pressure behind the eyes, facial pain, nasal congestion, and ear fullness. Denies eye redness, sore throat, ringing in the ears, wax buildup, runny nose or bloody nose. Respiratory: Positive cough. Denies difficulty breathing or shortness of breath.   Cardiovascular: Denies chest pain, chest tightness, palpitations or swelling in the hands or feet.   No other specific complaints in a complete review of systems (except as listed in HPI above).  Objective:    General: Appears his stated age, well developed, well nourished in NAD. HEENT: Head: normal shape and size, sinus tenderness noted; Eyes: sclera white, no icterus, conjunctiva pink, PERRLA and EOMs intact; Ears: Bilateral effusions with serous fluids; Tm's ntact, distorted light reflex; Nose: mucosa pink and edematous, septum midline; Throat/Mouth: + PND. Teeth present, mucosa pink and moist, no exudate noted, no lesions or ulcerations noted.  Neck: Neck supple, trachea midline. No massses, lumps or thyromegaly present.  Cardiovascular: Normal rate and rhythm. S1,S2 noted.  No murmur, rubs or gallops noted. No JVD or BLE edema. No carotid bruits noted. Pulmonary/Chest: Normal effort and positive vesicular breath sounds. No respiratory distress. No wheezes, rales or ronchi noted.      Assessment & Plan:   Acute bacterial sinusitis  Can use a Neti Pot which can be purchased from your local drug store. Continue Flonase 2 sprays each nostril for 3 days and then as needed. Amoxicillin 875mg  PO BID for 10 days  Recommended OTC Claritin 10mg  PO qhs   Betul Brisky S, Student-NP  RTC as needed or if symptoms persist.

## 2013-06-19 NOTE — Patient Instructions (Addendum)

## 2013-06-19 NOTE — Progress Notes (Signed)
Patient ID: [name], male   DOB: 11-Mar-1941, 73 y.o.   MRN: [id number] HPI  Pt presents to the clinic today with c/o nasal congestion, cough and ear fullness. He reports this started 1 week ago. He is blowing yellow mucous out of his nose. He denies fever, chills or body aches. He has taken an OTC allergy medication. He does take flonase daily. He is prone to sinus infections. He has not had sick contacts that he is aware of. He does not smoke.  Review of Systems    Past Medical History  Diagnosis Date  . Allergy   . Diverticulitis   . GERD (gastroesophageal reflux disease)   . Hyperlipidemia   . Anxiety   . Hypertension   . Osteoarthritis   . Spinal stenosis   . BPH (benign prostatic hypertrophy)   . Kidney stones     Family History  Problem Relation Age of Onset  . Hypertension Mother   . Cancer Mother     breast cancer  . Coronary artery disease Neg Hx   . Diabetes Neg Hx     History   Social History  . Marital Status: Married    Spouse Name: N/A    Number of Children: 3  . Years of Education: N/A   Occupational History  . retired    Social History Main Topics  . Smoking status: Never Smoker   . Smokeless tobacco: Never Used  . Alcohol Use: Yes     Comment: occassional wine  . Drug Use: No  . Sexual Activity: Not on file   Other Topics Concern  . Not on file   Social History Narrative   Has living will   Health care POA is wife, then oldest daughter [name]   Would accept resuscitation attempts. No prolonged artifical ventilation   No tube feeds if cognitively unaware    Allergies  Allergen Reactions  . Atorvastatin     REACTION: myalgias  . Simvastatin     REACTION: joint and stomach pain     Constitutional: Denies headache, fatigue, fever or abrupt weight changes.  HEENT:  Positive eye pain, pressure behind the eyes, facial pain, nasal congestion. Denies eye redness, ear pain, ringing in the ears, wax buildup, runny nose or bloody  nose. Respiratory: Positive cough. Denies difficulty breathing or shortness of breath.  Cardiovascular: Denies chest pain, chest tightness, palpitations or swelling in the hands or feet.   No other specific complaints in a complete review of systems (except as listed in HPI above).  Objective:   BP 132/74  Pulse 58  Temp(Src) 97.6 F (36.4 C) (Oral)  Wt 230 lb 12 oz (104.668 kg)  SpO2 98%  General: Appears his stated age, well developed, well nourished in NAD. HEENT: Head: normal shape and size, maxillary sinus tenderness noted; Eyes: sclera white, no icterus, conjunctiva pink, PERRLA and EOMs intact; Ears: Tm's gray and intact, normal light reflex; Nose: mucosa pink and moist, septum midline; Throat/Mouth: + PND. Teeth present, mucosa pink and moist, no exudate noted, no lesions or ulcerations noted.  Neck: Neck supple, trachea midline. No massses, lumps or thyromegaly present.  Cardiovascular: Normal rate and rhythm. S1,S2 noted.  No murmur, rubs or gallops noted. No JVD or BLE edema. No carotid bruits noted. Pulmonary/Chest: Normal effort and positive vesicular breath sounds. No respiratory distress. No wheezes, rales or ronchi noted.      Assessment & Plan:   Acute bacterial sinusitis  Can use a Neti Pot  which can be purchased from your local drug store. Flonase 2 sprays each nostril for 3 days and then as needed. Amoxil BID for 10 days Claritin daily OTC  RTC as needed or if symptoms persist.

## 2013-06-19 NOTE — Progress Notes (Signed)
Pre-visit discussion using our clinic review tool. No additional management support is needed unless otherwise documented below in the visit note.  

## 2013-06-20 ENCOUNTER — Other Ambulatory Visit: Payer: Self-pay

## 2013-06-20 ENCOUNTER — Ambulatory Visit: Payer: Medicare Other | Admitting: Internal Medicine

## 2013-06-20 NOTE — Telephone Encounter (Signed)
Pt was seen by [facility] yesterday and stated he wanted to know if he could get a refill 90 day supply with refills to last the full year on his Trazodone--please advise

## 2013-06-20 NOTE — Telephone Encounter (Signed)
Please confiry whether he is on it or now If he is taking it, okay to refill for a year

## 2013-06-20 NOTE — Telephone Encounter (Signed)
This medication was stopped because he said it didn't work and pt was seen by Dr. Gwenette Greet, who stated pt was going to take 49mths to loose weight and if that didn't work he would do a sleep study?

## 2013-06-20 NOTE — Telephone Encounter (Signed)
Spoke with patient and he states that he was told to stop the lorazepam and he did and started the trazodone back. Please advise

## 2013-06-21 MED ORDER — TRAZODONE HCL 50 MG PO TABS: 50.0000 mg | ORAL_TABLET | Freq: Every day | ORAL | Status: DC

## 2013-06-21 NOTE — Telephone Encounter (Signed)
rx sent to pharmacy by e-script  

## 2013-06-21 NOTE — Telephone Encounter (Signed)
Confirm his dose and refill for a year Usually I fill it as 50mg  1-2 at bedtime

## 2013-07-08 ENCOUNTER — Encounter: Payer: Self-pay | Admitting: Internal Medicine

## 2013-07-08 ENCOUNTER — Ambulatory Visit (INDEPENDENT_AMBULATORY_CARE_PROVIDER_SITE_OTHER): Payer: Medicare Other | Admitting: Internal Medicine

## 2013-07-08 VITALS — BP 146/92 | HR 72 | Temp 97.9°F | Wt 237.0 lb

## 2013-07-08 DIAGNOSIS — K1379 Other lesions of oral mucosa: Secondary | ICD-10-CM

## 2013-07-08 DIAGNOSIS — K1370 Unspecified lesions of oral mucosa: Secondary | ICD-10-CM | POA: Diagnosis not present

## 2013-07-08 MED ORDER — FIRST-DUKES MOUTHWASH MT SUSP: 5.0000 mL | Freq: Four times a day (QID) | OROMUCOSAL | Status: DC

## 2013-07-08 NOTE — Patient Instructions (Addendum)
Oral Ulcers °Oral ulcers are painful, shallow sores around the lining of the mouth. They can affect the gums, the inside of the lips and the cheeks (sores on the outside of the lips and on the face are different). They typically first occur in school aged children and teenagers. Oral ulcers may also be called canker sores or cold sores. °CAUSES  °Canker sores and cold sores can be caused by many factors including: °· Infection. °· Injury. °· Sun exposure. °· Medications. °· Emotional stress. °· Food allergies. °· Vitamin deficiencies. °· Toothpastes containing sodium lauryl sulfate. °The Herpes Virus can be the cause of mouth ulcers. The first infection can be severe and cause 10 or more ulcers on the gums, tongue and lips with fever and difficulty in swallowing. This infection usually occurs between the ages of 1 and 3 years.  °SYMPTOMS  °The typical sore is about ¼ inch (6 mm) in size, is an oval or round ulcer with red borders. °DIAGNOSIS  °Your caregiver can diagnose simple oral ulcers by examination. Additional testing is usually not required.  °TREATMENT  °Treatment is aimed at pain relief. Generally, oral ulcers resolve by themselves within 1 to 2 weeks without medication and are not contagious unless caused by Herpes (and other viruses). Antibiotics are not effective with mouth sores. Avoid direct contact with others until the ulcer is completely healed. See your caregiver for follow-up care as recommended. Also: °· Offer a soft diet. °· Encourage plenty of fluids to prevent dehydration. Popsicles and milk shakes can be helpful. °· Avoid acidic and salty foods and drinks such as orange juice. °· Infants and young children will often refuse to drink because of pain. Using a teaspoon, cup or syringe to give small amounts of fluids frequently can help prevent dehydration. °· Cold compresses on the face may help reduce pain. °· Pain medication can help control soreness. °· A solution of diphenhydramine mixed  with a liquid antacid can be useful to decrease the soreness of ulcers. Consult a caregiver for the dosing. °· Liquids or ointments with a numbing ingredient may be helpful when used as recommended. °· Older children and teenagers can rinse their mouth with a salt-water mixture (1/2 teaspoonof salt in 8 ounces of water) four times a day. This treatment is uncomfortable but may reduce the time the ulcers are present. °· There are many over the counter throat lozenges and medications available for oral ulcers. There effectiveness has not been studied. °· Consult your medical caregiver prior to using homeopathic treatments for oral ulcers. °SEEK MEDICAL CARE IF:  °· You think your child needs to be seen. °· The pain worsens and you cannot control it. °· There are 4 or more ulcers. °· The lips and gums begin to bleed and crust. °· A single mouth ulcer is near a tooth that is causing a toothache or pain. °· Your child has a fever, swollen face, or swollen glands. °· The ulcers began after starting a medication. °· Mouth ulcers keep re-occurring or last more than 2 weeks. °· You think your child is not taking adequate fluids. °SEEK IMMEDIATE MEDICAL CARE IF:  °· Your child has a high fever. °· Your child is unable to swallow or becomes dehydrated. °· Your child looks or acts very ill. °· An ulcer caused by a chemical your child accidentally put in their mouth. °Document Released: 06/02/2004 Document Revised: 07/18/2011 Document Reviewed: 01/15/2009 °[employer]® Patient Information ©2014 [employer]. ° °

## 2013-07-08 NOTE — Progress Notes (Signed)
Pre visit review using our clinic review tool, if applicable. No additional management support is needed unless otherwise documented below in the visit note. 

## 2013-07-08 NOTE — Progress Notes (Signed)
Subjective:    Patient ID: [name], male    DOB: Jul 14, 1940, 73 y.o.   MRN: [id number]  HPI  Pt presents to the clinic today with c/o canker sores in his mouth. He reports that this started about 1 week ago. He reports that they are very painful. He has gargled with salt water and a mixture of essential oils. He has had this in the past.  Review of Systems      Past Medical History  Diagnosis Date  . Allergy   . Diverticulitis   . GERD (gastroesophageal reflux disease)   . Hyperlipidemia   . Anxiety   . Hypertension   . Osteoarthritis   . Spinal stenosis   . BPH (benign prostatic hypertrophy)   . Kidney stones     Current Outpatient Prescriptions  Medication Sig Dispense Refill  . aspirin 81 MG tablet Take 81 mg by mouth daily.        Marland Kitchen doxazosin (CARDURA) 4 MG tablet Take 1 tablet (4 mg total) by mouth at bedtime.  90 tablet  3  . esomeprazole (NEXIUM) 40 MG capsule Take 1 capsule (40 mg total) by mouth daily before breakfast.  90 capsule  1  . finasteride (PROSCAR) 5 MG tablet Take 1 tablet (5 mg total) by mouth daily.  90 tablet  3  . fluticasone (FLONASE) 50 MCG/ACT nasal spray Place 2 sprays into the nose daily.  48 g  2  . meloxicam (MOBIC) 7.5 MG tablet Take 7.5 mg by mouth daily.      . traZODone (DESYREL) 50 MG tablet Take 1-2 tablets (50-100 mg total) by mouth at bedtime.  60 tablet  11  . Diphenhyd-Hydrocort-Nystatin (FIRST-DUKES MOUTHWASH) SUSP Swish and spit 5 mLs 4 (four) times daily.  237 mL  0   No current facility-administered medications for this visit.    Allergies  Allergen Reactions  . Atorvastatin     REACTION: myalgias  . Simvastatin     REACTION: joint and stomach pain    Family History  Problem Relation Age of Onset  . Hypertension Mother   . Cancer Mother     breast cancer  . Coronary artery disease Neg Hx   . Diabetes Neg Hx     History   Social History  . Marital Status: Married    Spouse Name: N/A    Number of  Children: 3  . Years of Education: N/A   Occupational History  . retired    Social History Main Topics  . Smoking status: Never Smoker   . Smokeless tobacco: Never Used  . Alcohol Use: Yes     Comment: occassional wine  . Drug Use: No  . Sexual Activity: Not on file   Other Topics Concern  . Not on file   Social History Narrative   Has living will   Health care POA is wife, then oldest daughter [name]   Would accept resuscitation attempts. No prolonged artifical ventilation   No tube feeds if cognitively unaware     Constitutional: Denies fever, malaise, fatigue, headache or abrupt weight changes.  HEENT: Pt reports oral ulcers. Denies eye pain, eye redness, ear pain, ringing in the ears, wax buildup, runny nose, nasal congestion, bloody nose, or sore throat.   No other specific complaints in a complete review of systems (except as listed in HPI above).  Objective:   Physical Exam  BP 146/92  Pulse 72  Temp(Src) 97.9 F (36.6 C) (Oral)  Wt 237 lb (107.502 kg)  SpO2 97% Wt Readings from Last 3 Encounters:  07/08/13 237 lb (107.502 kg)  06/19/13 230 lb 12 oz (104.668 kg)  05/22/13 239 lb 9.6 oz (108.682 kg)    General: Appears their stated age, well developed, well nourished in NAD. Skin: Warm, dry and intact. No rashes, lesions or ulcerations noted. HEENT: Head: normal shape and size; Eyes: sclera white, no icterus, conjunctiva pink, PERRLA and EOMs intact; Ears: Tm's gray and intact, normal light reflex; Nose: mucosa pink and moist, septum midline; Throat/Mouth: Teeth present, mucosa pink and moist, no exudate, oral ulcers noted on bilateral mucosa.   Cardiovascular: Normal rate and rhythm. S1,S2 noted.  No murmur, rubs or gallops noted. No JVD or BLE edema. No carotid bruits noted. Pulmonary/Chest: Normal effort and positive vesicular breath sounds. No respiratory distress. No wheezes, rales or ronchi noted.    BMET    Component Value Date/Time   NA 135  08/16/2012 1626   K 4.1 08/16/2012 1626   CL 100 08/16/2012 1626   CO2 26 08/16/2012 1626   GLUCOSE 89 08/16/2012 1626   BUN 15 08/16/2012 1626   CREATININE 0.9 08/16/2012 1626   CALCIUM 8.7 08/16/2012 1626   GFRNONAA >60 06/03/2010 0510   GFRAA  Value: >60        The eGFR has been calculated using the MDRD equation. This calculation has not been validated in all clinical situations. eGFR's persistently <60 mL/min signify possible Chronic Kidney Disease. 06/03/2010 0510    Lipid Panel     Component Value Date/Time   CHOL 208* 08/16/2012 1626   TRIG 115.0 08/16/2012 1626   HDL 32.80* 08/16/2012 1626   CHOLHDL 6 08/16/2012 1626   VLDL 23.0 08/16/2012 1626   LDLCALC 137* 09/27/2011 1033    CBC    Component Value Date/Time   WBC 7.2 08/16/2012 1626   RBC 4.72 08/16/2012 1626   HGB 13.7 08/16/2012 1626   HCT 40.0 08/16/2012 1626   PLT 189.0 08/16/2012 1626   MCV 84.9 08/16/2012 1626   MCH 28.2 06/03/2010 0510   MCHC 34.2 08/16/2012 1626   RDW 13.4 08/16/2012 1626   LYMPHSABS 1.4 08/16/2012 1626   MONOABS 0.5 08/16/2012 1626   EOSABS 0.4 08/16/2012 1626   BASOSABS 0.0 08/16/2012 1626    Hgb A1C No results found for this basename: HGBA1C         Assessment & Plan:   Oral ulcers:  eRx for dukes magic mouthwash as directed  RTC as needed or if symptoms persist or worsen

## 2013-08-20 ENCOUNTER — Ambulatory Visit (INDEPENDENT_AMBULATORY_CARE_PROVIDER_SITE_OTHER): Payer: Medicare Other | Admitting: Internal Medicine

## 2013-08-20 ENCOUNTER — Encounter: Payer: Self-pay | Admitting: Internal Medicine

## 2013-08-20 VITALS — BP 110/70 | HR 80 | Temp 97.7°F | Wt 235.0 lb

## 2013-08-20 DIAGNOSIS — K2270 Barrett's esophagus without dysplasia: Secondary | ICD-10-CM | POA: Diagnosis not present

## 2013-08-20 DIAGNOSIS — M48061 Spinal stenosis, lumbar region without neurogenic claudication: Secondary | ICD-10-CM

## 2013-08-20 DIAGNOSIS — Z23 Encounter for immunization: Secondary | ICD-10-CM

## 2013-08-20 DIAGNOSIS — Z0000 Encounter for general adult medical examination without abnormal findings: Secondary | ICD-10-CM | POA: Diagnosis not present

## 2013-08-20 DIAGNOSIS — E785 Hyperlipidemia, unspecified: Secondary | ICD-10-CM

## 2013-08-20 DIAGNOSIS — N400 Enlarged prostate without lower urinary tract symptoms: Secondary | ICD-10-CM

## 2013-08-20 LAB — CBC WITH DIFFERENTIAL/PLATELET
Basophils Absolute: 0 10*3/uL (ref 0.0–0.1)
Basophils Relative: 0.6 % (ref 0.0–3.0)
Eosinophils Absolute: 0.4 10*3/uL (ref 0.0–0.7)
Eosinophils Relative: 6.4 % — ABNORMAL HIGH (ref 0.0–5.0)
HCT: 42.4 % (ref 39.0–52.0)
HEMOGLOBIN: 14.2 g/dL (ref 13.0–17.0)
Lymphocytes Relative: 20.4 % (ref 12.0–46.0)
Lymphs Abs: 1.3 10*3/uL (ref 0.7–4.0)
MCHC: 33.5 g/dL (ref 30.0–36.0)
MCV: 86.7 fl (ref 78.0–100.0)
Monocytes Absolute: 0.6 10*3/uL (ref 0.1–1.0)
Monocytes Relative: 8.4 % (ref 3.0–12.0)
NEUTROS ABS: 4.2 10*3/uL (ref 1.4–7.7)
Neutrophils Relative %: 64.2 % (ref 43.0–77.0)
Platelets: 208 10*3/uL (ref 150.0–400.0)
RBC: 4.89 Mil/uL (ref 4.22–5.81)
RDW: 13.3 % (ref 11.5–14.6)
WBC: 6.5 10*3/uL (ref 4.5–10.5)

## 2013-08-20 LAB — COMPREHENSIVE METABOLIC PANEL
ALT: 16 U/L (ref 0–53)
AST: 20 U/L (ref 0–37)
Albumin: 4.1 g/dL (ref 3.5–5.2)
Alkaline Phosphatase: 44 U/L (ref 39–117)
BUN: 12 mg/dL (ref 6–23)
CHLORIDE: 103 meq/L (ref 96–112)
CO2: 29 meq/L (ref 19–32)
Calcium: 9.2 mg/dL (ref 8.4–10.5)
Creatinine, Ser: 0.8 mg/dL (ref 0.4–1.5)
GFR: 96.75 mL/min (ref >60.00)
Glucose, Bld: 87 mg/dL (ref 70–99)
Potassium: 4.4 mEq/L (ref 3.5–5.1)
Sodium: 139 mEq/L (ref 135–145)
Total Bilirubin: 1 mg/dL (ref 0.3–1.2)
Total Protein: 6.3 g/dL (ref 6.0–8.3)

## 2013-08-20 LAB — LIPID PANEL
Cholesterol: 216 mg/dL — ABNORMAL HIGH (ref 0–200)
HDL: 38 mg/dL — ABNORMAL LOW (ref >39.00)
LDL CALC: 158 mg/dL — AB (ref 0–99)
TRIGLYCERIDES: 100 mg/dL (ref 0.0–149.0)
Total CHOL/HDL Ratio: 6
VLDL: 20 mg/dL (ref 0.0–40.0)

## 2013-08-20 LAB — PSA: PSA: 0.97 ng/mL (ref 0.10–4.00)

## 2013-08-20 LAB — T4, FREE: FREE T4: 1.12 ng/dL (ref 0.60–1.60)

## 2013-08-20 LAB — TSH: TSH: 1.18 u[IU]/mL (ref 0.35–5.50)

## 2013-08-20 NOTE — Addendum Note (Signed)
Addended by: Despina Hidden on: 08/20/2013 04:27 PM   Modules accepted: Orders

## 2013-08-20 NOTE — Assessment & Plan Note (Signed)
Intolerant of statins so no primary prevention

## 2013-08-20 NOTE — Assessment & Plan Note (Signed)
Limiting him now Will refer for PT

## 2013-08-20 NOTE — Progress Notes (Signed)
   Subjective:    Patient ID: [name], male    DOB: 1940/05/23, 73 y.o.   MRN: [id number]  HPI Here for Medicare wellness and follow up Reviewed his form No falls No depression or anhedonia No alcohol or tobacco Independent with all instrumental ADLs Tries to exercise Reviewed other physicians Reviewed advanced directives Not sexually active Stress with wife still busy taking care of her 6 year old father  Now sleeping great Not using the trazodone even No daytime somnolence---feels good Has cut back on eating at night---has made lunch his main meal  Having problems with the lumbar spinal stenosis Has called the surgeon and he won't see him without an MRI Had been walking 1.5 miles per day---but worse with leg pain Tried chiropractor and acupuncture---getting some relief Interested in trying PT again---Robin in Dr Archie Endo office Uses mobic for hand arthritis--it helps this  Takes nexium still This controls reflux No swallowing problems Will be due next year for EGD--had seen Dr Sharlett Iles so will need new GI  Voids well Good stream now Nocturia x 2-- stable No daytime problems except mild dribbling   Review of Systems Weight has stabilized Bowels are okay---mild constipation at times. Will use natural product if needed No skin issues    Objective:   Physical Exam  Constitutional: He is oriented to person, place, and time. He appears well-developed and well-nourished. No distress.  HENT:  Mouth/Throat: Oropharynx is clear and moist. No oropharyngeal exudate.  Neck: Normal range of motion. Neck supple. No thyromegaly present.  Cardiovascular: Normal rate, regular rhythm, normal heart sounds and intact distal pulses.  Exam reveals no gallop.   No murmur heard. Pulmonary/Chest: Effort normal and breath sounds normal. No respiratory distress. He has no wheezes. He has no rales.  Abdominal: Soft. There is no tenderness.  Musculoskeletal: He exhibits no edema  and no tenderness.  Lymphadenopathy:    He has no cervical adenopathy.  Neurological: He is alert and oriented to person, place, and time.  President-- "Elyn Peers, [place], [place]" [phone] [id number] Recall 2/3  Skin: No rash noted.  Psychiatric: He has a normal mood and affect. His behavior is normal.          Assessment & Plan:

## 2013-08-20 NOTE — Assessment & Plan Note (Signed)
I have personally reviewed the Medicare Annual Wellness questionnaire and have noted 1. The patient's medical and social history 2. Their use of alcohol, tobacco or illicit drugs 3. Their current medications and supplements 4. The patient's functional ability including ADL's, fall risks, home safety risks and hearing or visual             impairment. 5. Diet and physical activities 6. Evidence for depression or mood disorders  The patients weight, height, BMI and visual acuity have been recorded in the chart I have made referrals, counseling and provided education to the patient based review of the above and I have provided the pt with a written personalized care plan for preventive services.  I have provided you with a copy of your personalized plan for preventive services. Please take the time to review along with your updated medication list.  Will give prevnar Discussed---will continue PSA today and in 2 years Discussed fitness

## 2013-08-20 NOTE — Assessment & Plan Note (Signed)
On PPI Due for EGD next year or so

## 2013-08-20 NOTE — Patient Instructions (Signed)
Please look into the [facility] for back pain.  DASH Diet The DASH diet stands for "Dietary Approaches to Stop Hypertension." It is a healthy eating plan that has been shown to reduce high blood pressure (hypertension) in as little as 14 days, while also possibly providing other significant health benefits. These other health benefits include reducing the risk of breast cancer after menopause and reducing the risk of type 2 diabetes, heart disease, colon cancer, and stroke. Health benefits also include weight loss and slowing kidney failure in patients with chronic kidney disease.  DIET GUIDELINES  Limit salt (sodium). Your diet should contain less than 1500 mg of sodium daily.  Limit refined or processed carbohydrates. Your diet should include mostly whole grains. Desserts and added sugars should be used sparingly.  Include small amounts of heart-healthy fats. These types of fats include nuts, oils, and tub margarine. Limit saturated and trans fats. These fats have been shown to be harmful in the body. CHOOSING FOODS  The following food groups are based on a 2000 calorie diet. See your Registered Dietitian for individual calorie needs. Grains and Grain Products (6 to 8 servings daily)  Eat More Often: Whole-wheat bread, brown rice, whole-grain or wheat pasta, quinoa, popcorn without added fat or salt (air popped).  Eat Less Often: White bread, white pasta, white rice, cornbread. Vegetables (4 to 5 servings daily)  Eat More Often: Fresh, frozen, and canned vegetables. Vegetables may be raw, steamed, roasted, or grilled with a minimal amount of fat.  Eat Less Often/Avoid: Creamed or fried vegetables. Vegetables in a cheese sauce. Fruit (4 to 5 servings daily)  Eat More Often: All fresh, canned (in natural juice), or frozen fruits. Dried fruits without added sugar. One hundred percent fruit juice ( cup [237 mL] daily).  Eat Less Often: Dried fruits with added sugar. Canned fruit in  light or heavy syrup. [employer], Fish, and Poultry (2 servings or less daily. One serving is 3 to 4 oz [85-114 g]).  Eat More Often: Ninety percent or leaner ground beef, tenderloin, sirloin. Round cuts of beef, chicken breast, [place] breast. All fish. Grill, bake, or broil your meat. Nothing should be fried.  Eat Less Often/Avoid: Fatty cuts of meat, [place], or chicken leg, thigh, or wing. Fried cuts of meat or fish. Dairy (2 to 3 servings)  Eat More Often: Low-fat or fat-free milk, low-fat plain or light yogurt, reduced-fat or part-skim cheese.  Eat Less Often/Avoid: Milk (whole, 2%).Whole milk yogurt. Full-fat cheeses. Nuts, Seeds, and Legumes (4 to 5 servings per week)  Eat More Often: All without added salt.  Eat Less Often/Avoid: Salted nuts and seeds, canned beans with added salt. Fats and Sweets (limited)  Eat More Often: Vegetable oils, tub margarines without trans fats, sugar-free gelatin. Mayonnaise and salad dressings.  Eat Less Often/Avoid: Coconut oils, palm oils, butter, stick margarine, cream, half and half, cookies, candy, pie. FOR MORE INFORMATION The Dash Diet Eating Plan: www.dashdiet.org Document Released: 04/14/2011 Document Revised: 07/18/2011 Document Reviewed: 04/14/2011 [facility] Patient Information 2014 [place], [place].

## 2013-08-20 NOTE — Progress Notes (Signed)
Pre visit review using our clinic review tool, if applicable. No additional management support is needed unless otherwise documented below in the visit note. 

## 2013-08-20 NOTE — Assessment & Plan Note (Signed)
Doing well on the dual therapy

## 2013-08-22 ENCOUNTER — Encounter: Payer: Self-pay | Admitting: *Deleted

## 2013-09-05 ENCOUNTER — Telehealth: Payer: Self-pay

## 2013-09-05 NOTE — Telephone Encounter (Signed)
Robin with [facility] left v/m requesting order for PT faxed to [phone].

## 2013-09-05 NOTE — Telephone Encounter (Signed)
Rx written. Please fax.

## 2013-09-06 NOTE — Telephone Encounter (Signed)
Order faxed and scanned.

## 2013-09-09 DIAGNOSIS — M545 Low back pain, unspecified: Secondary | ICD-10-CM | POA: Diagnosis not present

## 2013-09-09 DIAGNOSIS — M48061 Spinal stenosis, lumbar region without neurogenic claudication: Secondary | ICD-10-CM | POA: Diagnosis not present

## 2013-09-10 DIAGNOSIS — H5000 Unspecified esotropia: Secondary | ICD-10-CM | POA: Diagnosis not present

## 2013-09-17 DIAGNOSIS — M48061 Spinal stenosis, lumbar region without neurogenic claudication: Secondary | ICD-10-CM | POA: Diagnosis not present

## 2013-09-17 DIAGNOSIS — M545 Low back pain, unspecified: Secondary | ICD-10-CM | POA: Diagnosis not present

## 2013-09-24 DIAGNOSIS — S43429A Sprain of unspecified rotator cuff capsule, initial encounter: Secondary | ICD-10-CM | POA: Diagnosis not present

## 2013-09-24 DIAGNOSIS — M25569 Pain in unspecified knee: Secondary | ICD-10-CM | POA: Diagnosis not present

## 2013-10-03 DIAGNOSIS — R262 Difficulty in walking, not elsewhere classified: Secondary | ICD-10-CM | POA: Diagnosis not present

## 2013-10-03 DIAGNOSIS — T148XXA Other injury of unspecified body region, initial encounter: Secondary | ICD-10-CM | POA: Diagnosis not present

## 2013-10-14 ENCOUNTER — Other Ambulatory Visit (HOSPITAL_COMMUNITY): Payer: Self-pay | Admitting: Cardiology

## 2013-10-14 ENCOUNTER — Ambulatory Visit (HOSPITAL_COMMUNITY): Payer: Medicare Other | Attending: Orthopedic Surgery | Admitting: Cardiology

## 2013-10-14 DIAGNOSIS — M7989 Other specified soft tissue disorders: Secondary | ICD-10-CM | POA: Diagnosis not present

## 2013-10-14 DIAGNOSIS — M79609 Pain in unspecified limb: Secondary | ICD-10-CM | POA: Diagnosis not present

## 2013-10-14 DIAGNOSIS — T148XXA Other injury of unspecified body region, initial encounter: Secondary | ICD-10-CM | POA: Diagnosis not present

## 2013-10-14 DIAGNOSIS — R262 Difficulty in walking, not elsewhere classified: Secondary | ICD-10-CM | POA: Diagnosis not present

## 2013-10-14 DIAGNOSIS — R0602 Shortness of breath: Secondary | ICD-10-CM | POA: Diagnosis not present

## 2013-10-14 DIAGNOSIS — I10 Essential (primary) hypertension: Secondary | ICD-10-CM | POA: Insufficient documentation

## 2013-10-14 NOTE — Progress Notes (Signed)
Lower venous duplex unilateral completed

## 2013-10-28 DIAGNOSIS — M25569 Pain in unspecified knee: Secondary | ICD-10-CM | POA: Diagnosis not present

## 2013-10-28 DIAGNOSIS — M25559 Pain in unspecified hip: Secondary | ICD-10-CM | POA: Diagnosis not present

## 2013-10-28 DIAGNOSIS — M48061 Spinal stenosis, lumbar region without neurogenic claudication: Secondary | ICD-10-CM | POA: Diagnosis not present

## 2013-11-04 DIAGNOSIS — M47817 Spondylosis without myelopathy or radiculopathy, lumbosacral region: Secondary | ICD-10-CM | POA: Diagnosis not present

## 2013-11-04 DIAGNOSIS — M1710 Unilateral primary osteoarthritis, unspecified knee: Secondary | ICD-10-CM | POA: Diagnosis not present

## 2013-11-22 DIAGNOSIS — M47817 Spondylosis without myelopathy or radiculopathy, lumbosacral region: Secondary | ICD-10-CM | POA: Diagnosis not present

## 2013-11-22 DIAGNOSIS — M1710 Unilateral primary osteoarthritis, unspecified knee: Secondary | ICD-10-CM | POA: Diagnosis not present

## 2013-11-28 ENCOUNTER — Encounter: Payer: Self-pay | Admitting: Gastroenterology

## 2013-11-29 ENCOUNTER — Encounter: Payer: Self-pay | Admitting: Gastroenterology

## 2013-12-05 ENCOUNTER — Ambulatory Visit (HOSPITAL_COMMUNITY): Payer: Medicare Other | Attending: Cardiovascular Disease | Admitting: Cardiology

## 2013-12-05 ENCOUNTER — Other Ambulatory Visit (HOSPITAL_COMMUNITY): Payer: Self-pay | Admitting: Cardiology

## 2013-12-05 DIAGNOSIS — M79609 Pain in unspecified limb: Secondary | ICD-10-CM

## 2013-12-05 DIAGNOSIS — M7989 Other specified soft tissue disorders: Secondary | ICD-10-CM

## 2013-12-05 DIAGNOSIS — M79669 Pain in unspecified lower leg: Secondary | ICD-10-CM

## 2013-12-05 DIAGNOSIS — M1710 Unilateral primary osteoarthritis, unspecified knee: Secondary | ICD-10-CM | POA: Diagnosis not present

## 2013-12-05 NOTE — Progress Notes (Signed)
Unilateral lower venous duplex performed  

## 2013-12-08 DIAGNOSIS — H16219 Exposure keratoconjunctivitis, unspecified eye: Secondary | ICD-10-CM | POA: Diagnosis not present

## 2013-12-09 ENCOUNTER — Other Ambulatory Visit: Payer: Self-pay | Admitting: *Deleted

## 2013-12-09 DIAGNOSIS — H169 Unspecified keratitis: Secondary | ICD-10-CM | POA: Diagnosis not present

## 2013-12-09 MED ORDER — ESOMEPRAZOLE MAGNESIUM 40 MG PO CPDR: 40.0000 mg | DELAYED_RELEASE_CAPSULE | Freq: Every day | ORAL | Status: DC

## 2013-12-13 ENCOUNTER — Encounter: Payer: Self-pay | Admitting: Gastroenterology

## 2013-12-18 DIAGNOSIS — IMO0002 Reserved for concepts with insufficient information to code with codable children: Secondary | ICD-10-CM | POA: Diagnosis not present

## 2013-12-18 DIAGNOSIS — M5137 Other intervertebral disc degeneration, lumbosacral region: Secondary | ICD-10-CM | POA: Diagnosis not present

## 2014-01-06 ENCOUNTER — Encounter: Payer: Self-pay | Admitting: Internal Medicine

## 2014-01-06 ENCOUNTER — Ambulatory Visit (INDEPENDENT_AMBULATORY_CARE_PROVIDER_SITE_OTHER): Payer: Medicare Other | Admitting: Internal Medicine

## 2014-01-06 VITALS — BP 130/70 | HR 82 | Temp 98.0°F | Wt 241.0 lb

## 2014-01-06 DIAGNOSIS — J3489 Other specified disorders of nose and nasal sinuses: Secondary | ICD-10-CM | POA: Diagnosis not present

## 2014-01-06 DIAGNOSIS — R0981 Nasal congestion: Secondary | ICD-10-CM | POA: Insufficient documentation

## 2014-01-06 MED ORDER — LORAZEPAM 0.5 MG PO TABS: 0.5000 mg | ORAL_TABLET | Freq: Every evening | ORAL | Status: DC | PRN

## 2014-01-06 MED ORDER — IPRATROPIUM BROMIDE 0.03 % NA SOLN: 2.0000 | Freq: Three times a day (TID) | NASAL | Status: DC

## 2014-01-06 NOTE — Progress Notes (Signed)
Subjective:    Patient ID: [name], male    DOB: 08-13-1940, 73 y.o.   MRN: [id number]  HPI Having trouble sleeping Will sleep for 4 hours then wake up---totally stuffed in nose flonase not helping anymore  Tried vicks---no help Will actually have panic attack because he can't breathe--likes to have the lorazepam for emergencies Does use the trazodone  Has tear in left shoulder Pain radiates up to left ear Also having frontal and occipital headaches Acetaminophen 1000mg  no help Tried essential oils---frankincense, peppermint and eucalyptus and it helped  Feels his sinuses are clogged Uses the flonase daily-- bedtime No prior allergies --ragweed or otherwise No fever And doesn't feel sick No cough or drainage  Replaces pillow regularly  Current Outpatient Prescriptions on File Prior to Visit  Medication Sig Dispense Refill  . aspirin 81 MG tablet Take 81 mg by mouth daily.        Marland Kitchen doxazosin (CARDURA) 4 MG tablet Take 1 tablet (4 mg total) by mouth at bedtime.  90 tablet  3  . esomeprazole (NEXIUM) 40 MG capsule Take 1 capsule (40 mg total) by mouth daily before breakfast.  90 capsule  1  . finasteride (PROSCAR) 5 MG tablet Take 1 tablet (5 mg total) by mouth daily.  90 tablet  3  . meloxicam (MOBIC) 7.5 MG tablet Take 7.5 mg by mouth daily.       No current facility-administered medications on file prior to visit.    Allergies  Allergen Reactions  . Atorvastatin     REACTION: myalgias  . Simvastatin     REACTION: joint and stomach pain    Past Medical History  Diagnosis Date  . Allergy   . Diverticulitis   . GERD (gastroesophageal reflux disease)   . Hyperlipidemia   . Anxiety   . Hypertension   . Osteoarthritis   . Spinal stenosis   . BPH (benign prostatic hypertrophy)   . Kidney stones     Past Surgical History  Procedure Laterality Date  . Tendon repair      Left knee tendon repair--1966  . Vasectomy      Vasectomy/spermatocele/?testicular  cyst--1970  . Rotator cuff repair      R rotator cuff repair--1/00  Noemi Chapel)  . Eye muscle surgery      L eye muscle surgery--2/01  . Cardiovascular stress test      negative  . Esophagogastroduodenoscopy      colon--6/04, Barrett's--7/05, Barrett's but no cancer--4/08  . Knee arthroscopy      7/ 09 Arthroscopy left knee--Dr Noemi Chapel  . Joint replacement  1/12    left TKR  . Lipoma excision Left 2010    compressing nerve by spine---Dr Terald Sleeper at [facility] History  Problem Relation Age of Onset  . Hypertension Mother   . Cancer Mother     breast cancer  . Coronary artery disease Neg Hx   . Diabetes Neg Hx     History   Social History  . Marital Status: Married    Spouse Name: N/A    Number of Children: 3  . Years of Education: N/A   Occupational History  . retired    Social History Main Topics  . Smoking status: Never Smoker   . Smokeless tobacco: Never Used  . Alcohol Use: Yes     Comment: occassional wine  . Drug Use: No  . Sexual Activity: Not on file   Other Topics Concern  . Not  on file   Social History Narrative   Has living will   Health care POA is wife, then oldest daughter [name]   Would accept resuscitation attempts. No prolonged artifical ventilation   No tube feeds if cognitively unaware   Review of Systems Knows he snores---wife sleeps in another room Not clearly gasping or apneic    Objective:   Physical Exam  Constitutional: He appears well-developed and well-nourished. No distress.  HENT:  Mouth/Throat: Oropharynx is clear and moist. No oropharyngeal exudate.  Moderate nasal inflammation--not really allergic TMs fine   Neck: Normal range of motion. Neck supple. No thyromegaly present.  Pulmonary/Chest: Effort normal and breath sounds normal. No respiratory distress. He has no wheezes. He has no rales.  Lymphadenopathy:    He has no cervical adenopathy.          Assessment & Plan:

## 2014-01-06 NOTE — Assessment & Plan Note (Signed)
Not sick or infected Has tried tylenol PM with some success No history of allergies--but worse now with ragweed season  flonase no help Will try fexofenadine Ipratropium nasal spray If persists, ENT eval

## 2014-01-06 NOTE — Patient Instructions (Signed)
Please try fexofenadine 180mg  at bedtime. You can also take loratadine 10mg  1-2 at bedtime also. Try the ipratropium spray for your nose--- three times a day. If your symptoms persist, you will need an appointment with an ENT physician

## 2014-01-06 NOTE — Progress Notes (Signed)
Pre visit review using our clinic review tool, if applicable. No additional management support is needed unless otherwise documented below in the visit note. 

## 2014-01-07 ENCOUNTER — Ambulatory Visit (INDEPENDENT_AMBULATORY_CARE_PROVIDER_SITE_OTHER): Payer: Medicare Other | Admitting: Cardiology

## 2014-01-07 ENCOUNTER — Encounter: Payer: Self-pay | Admitting: Cardiology

## 2014-01-07 VITALS — BP 120/70 | HR 78 | Ht 69.0 in | Wt 240.0 lb

## 2014-01-07 DIAGNOSIS — I10 Essential (primary) hypertension: Secondary | ICD-10-CM | POA: Diagnosis not present

## 2014-01-07 DIAGNOSIS — M75120 Complete rotator cuff tear or rupture of unspecified shoulder, not specified as traumatic: Secondary | ICD-10-CM | POA: Diagnosis not present

## 2014-01-07 NOTE — Patient Instructions (Signed)
Your physician recommends that you schedule a follow-up appointment in: as needed  

## 2014-01-07 NOTE — Progress Notes (Signed)
HPI The patient presents for evaluation prior to shoulder surgery.  He has a history of an abnormal stress test.  I have seen him in the distant past for this.  He was seen preoperatively.  He had ischemia on perfusion study.  However, cath in 2010 demonstrated no CAD.  He is now being evaluated for shoulder replacement per his report.  He denies any ongoing symptoms such as chest pain, neck or arm pain.  He has been limited by some knee pain.  However, he is still able to carry activities such as climbing 24 stairs and recently riding a stationary bike without symptoms.  The patient denies any new symptoms such as chest discomfort, neck or arm discomfort. There has been no new shortness of breath, PND or orthopnea. There have been no reported palpitations, presyncope or syncope.  Allergies  Allergen Reactions  . Atorvastatin     REACTION: myalgias  . Simvastatin     REACTION: joint and stomach pain    Current Outpatient Prescriptions  Medication Sig Dispense Refill  . acetaminophen (TYLENOL) 500 MG tablet Take 500 mg by mouth daily.      Marland Kitchen aspirin 81 MG tablet Take 81 mg by mouth daily.        Marland Kitchen doxazosin (CARDURA) 4 MG tablet Take 1 tablet (4 mg total) by mouth at bedtime.  90 tablet  3  . esomeprazole (NEXIUM) 40 MG capsule Take 1 capsule (40 mg total) by mouth daily before breakfast.  90 capsule  1  . finasteride (PROSCAR) 5 MG tablet Take 1 tablet (5 mg total) by mouth daily.  90 tablet  3  . ipratropium (ATROVENT) 0.03 % nasal spray Place 2 sprays into both nostrils 3 (three) times daily.  30 mL  12  . LORazepam (ATIVAN) 0.5 MG tablet Take 1 tablet (0.5 mg total) by mouth at bedtime as needed.  30 tablet  0  . meloxicam (MOBIC) 7.5 MG tablet Take 7.5 mg by mouth daily.      . traZODone (DESYREL) 50 MG tablet Take 50 mg by mouth as needed.        No current facility-administered medications for this visit.    Past Medical History  Diagnosis Date  . Allergy   . Diverticulitis     . GERD (gastroesophageal reflux disease)   . Hyperlipidemia   . Anxiety   . Hypertension   . Osteoarthritis   . Spinal stenosis   . BPH (benign prostatic hypertrophy)   . Kidney stones     Past Surgical History  Procedure Laterality Date  . Tendon repair      Left knee tendon repair--1966  . Vasectomy      Vasectomy/spermatocele/?testicular cyst--1970  . Rotator cuff repair      R rotator cuff repair--1/00  Noemi Chapel)  . Eye muscle surgery      L eye muscle surgery--2/01  . Cardiovascular stress test      negative  . Esophagogastroduodenoscopy      colon--6/04, Barrett's--7/05, Barrett's but no cancer--4/08  . Knee arthroscopy      7/ 09 Arthroscopy left knee--Dr Noemi Chapel  . Joint replacement  1/12    left TKR  . Lipoma excision Left 2010    compressing nerve by spine---Dr [facility] S[facility]eper at [facility] History  Problem Relation Age of Onset  . Hypertension Mother   . Cancer Mother     breast cancer  . Coronary artery disease Neg Hx   . Diabetes  Neg Hx     History   Social History  . Marital Status: Married    Spouse Name: N/A    Number of Children: 3  . Years of Education: N/A   Occupational History  . retired    Social History Main Topics  . Smoking status: Never Smoker   . Smokeless tobacco: Never Used  . Alcohol Use: Yes     Comment: occassional wine  . Drug Use: No  . Sexual Activity: Not on file   Other Topics Concern  . Not on file   Social History Narrative   Has living will   Health care POA is wife, then oldest daughter [name]   Would accept resuscitation attempts. No prolonged artifical ventilation   No tube feeds if cognitively unaware    ROS:  As stated in the HPI and negative for all other systems.  PHYSICAL EXAM BP 120/70  Pulse 78  Ht 5\' 9"  (1.753 m)  Wt 240 lb (108.863 kg)  BMI 35.43 kg/m2 GENERAL:  Well appearing HEENT:  Pupils equal round and reactive, fundi not visualized, oral mucosa unremarkable NECK:  No jugular venous  distention, waveform within normal limits, carotid upstroke brisk and symmetric, no bruits, no thyromegaly LYMPHATICS:  No cervical, inguinal adenopathy LUNGS:  Clear to auscultation bilaterally BACK:  No CVA tenderness CHEST:  Unremarkable HEART:  PMI not displaced or sustained,S1 and S2 within normal limits, no S3, no S4, no clicks, no rubs, no murmurs ABD:  Flat, positive bowel sounds normal in frequency in pitch, no bruits, no rebound, no guarding, no midline pulsatile mass, no hepatomegaly, no splenomegaly, obese EXT:  2 plus pulses throughout, no edema, no cyanosis no clubbing SKIN:  No rashes no nodules NEURO:  Cranial nerves II through XII grossly intact, motor grossly intact throughout PSYCH:  Cognitively intact, oriented to person place and time   EKG:  NSR, rate 78, LAD, LAFB, first degree AV block, no acute ST T wave changes.   ASSESSMENT AND PLAN  PREOP:  The patient is not going for a high risk procedure.  He does have a moderate functional level (greater than 5 METS).  Therefore, based on ACC/AHA guidelines, the patient would be at acceptable risk for the planned procedure without further cardiovascular testing.  HTN:  The blood pressure is at target. No change in medications is indicated. We will continue with therapeutic lifestyle changes (TLC).  OVERWEIGHT:  The patient understands the need to lose weight with diet and exercise. We have discussed specific strategies for this.  HYPERLIPIDEMIA:  We did discuss this.  His LDL was 157 which is very close to the indication for primary preventive treatment.  He has been on statin in the past but chose not to continue.  He would like to look at his diet before starting a statin again.  I will defer to Viviana Simpler, MD

## 2014-01-08 ENCOUNTER — Other Ambulatory Visit (HOSPITAL_COMMUNITY): Payer: Self-pay | Admitting: Orthopedic Surgery

## 2014-01-08 ENCOUNTER — Encounter: Payer: Self-pay | Admitting: Cardiology

## 2014-01-08 DIAGNOSIS — M75102 Unspecified rotator cuff tear or rupture of left shoulder, not specified as traumatic: Secondary | ICD-10-CM

## 2014-01-09 ENCOUNTER — Ambulatory Visit (HOSPITAL_COMMUNITY)
Admission: RE | Admit: 2014-01-09 | Discharge: 2014-01-09 | Disposition: A | Discharge status: Home / Self Care | Payer: Medicare Other | Source: Ambulatory Visit | Attending: Orthopedic Surgery | Admitting: Orthopedic Surgery

## 2014-01-17 DIAGNOSIS — H6120 Impacted cerumen, unspecified ear: Secondary | ICD-10-CM | POA: Diagnosis not present

## 2014-01-17 DIAGNOSIS — R42 Dizziness and giddiness: Secondary | ICD-10-CM | POA: Diagnosis not present

## 2014-01-17 DIAGNOSIS — G4733 Obstructive sleep apnea (adult) (pediatric): Secondary | ICD-10-CM | POA: Diagnosis not present

## 2014-01-17 DIAGNOSIS — H8110 Benign paroxysmal vertigo, unspecified ear: Secondary | ICD-10-CM | POA: Diagnosis not present

## 2014-01-22 ENCOUNTER — Other Ambulatory Visit (HOSPITAL_COMMUNITY): Payer: Self-pay | Admitting: Orthopedic Surgery

## 2014-01-22 ENCOUNTER — Ambulatory Visit (HOSPITAL_COMMUNITY)
Admission: RE | Admit: 2014-01-22 | Discharge: 2014-01-22 | Disposition: A | Discharge status: Home / Self Care | Payer: Medicare Other | Source: Ambulatory Visit | Attending: Orthopedic Surgery | Admitting: Orthopedic Surgery

## 2014-01-22 DIAGNOSIS — S43422A Sprain of left rotator cuff capsule, initial encounter: Secondary | ICD-10-CM

## 2014-01-22 DIAGNOSIS — M25519 Pain in unspecified shoulder: Secondary | ICD-10-CM | POA: Diagnosis not present

## 2014-01-22 DIAGNOSIS — S46819A Strain of other muscles, fascia and tendons at shoulder and upper arm level, unspecified arm, initial encounter: Secondary | ICD-10-CM | POA: Diagnosis not present

## 2014-01-22 DIAGNOSIS — M75102 Unspecified rotator cuff tear or rupture of left shoulder, not specified as traumatic: Secondary | ICD-10-CM

## 2014-01-23 DIAGNOSIS — M75120 Complete rotator cuff tear or rupture of unspecified shoulder, not specified as traumatic: Secondary | ICD-10-CM | POA: Diagnosis not present

## 2014-01-27 ENCOUNTER — Ambulatory Visit: Payer: Medicare Other | Admitting: Cardiology

## 2014-02-03 ENCOUNTER — Telehealth: Payer: Self-pay | Admitting: *Deleted

## 2014-02-03 NOTE — Telephone Encounter (Signed)
Pt called and lmom that he wanted to d/c ipratropium, and restart flonase ns. He had previously d/c flonase because he thought it didn't help, but the ipratropium doesn't work as well as the flonase did. [facility]. Is it ok to switch back to the flonase?

## 2014-02-04 ENCOUNTER — Ambulatory Visit: Payer: Self-pay | Admitting: Unknown Physician Specialty

## 2014-02-04 DIAGNOSIS — R0609 Other forms of dyspnea: Secondary | ICD-10-CM | POA: Diagnosis not present

## 2014-02-04 DIAGNOSIS — E669 Obesity, unspecified: Secondary | ICD-10-CM | POA: Diagnosis not present

## 2014-02-04 DIAGNOSIS — G4700 Insomnia, unspecified: Secondary | ICD-10-CM | POA: Diagnosis not present

## 2014-02-04 DIAGNOSIS — G478 Other sleep disorders: Secondary | ICD-10-CM | POA: Diagnosis not present

## 2014-02-04 DIAGNOSIS — R0989 Other specified symptoms and signs involving the circulatory and respiratory systems: Secondary | ICD-10-CM | POA: Diagnosis not present

## 2014-02-04 DIAGNOSIS — G4733 Obstructive sleep apnea (adult) (pediatric): Secondary | ICD-10-CM | POA: Diagnosis not present

## 2014-02-04 MED ORDER — FLUTICASONE PROPIONATE 50 MCG/ACT NA SUSP: 2.0000 | Freq: Every day | NASAL | Status: DC

## 2014-02-04 NOTE — Telephone Encounter (Signed)
Yes

## 2014-02-04 NOTE — Telephone Encounter (Signed)
Rx sent to requested pharmacy

## 2014-02-06 ENCOUNTER — Other Ambulatory Visit: Payer: Self-pay | Admitting: *Deleted

## 2014-02-06 NOTE — Telephone Encounter (Signed)
Last office visit 01/06/2014.  Ok to refill?

## 2014-02-07 MED ORDER — TRAZODONE HCL 50 MG PO TABS: 50.0000 mg | ORAL_TABLET | Freq: Every evening | ORAL | Status: DC | PRN

## 2014-02-07 NOTE — Telephone Encounter (Signed)
Rx sent as directed. 

## 2014-02-07 NOTE — Telephone Encounter (Signed)
Okay to refill for a year Should be--- at bedtime prn

## 2014-02-14 ENCOUNTER — Ambulatory Visit: Payer: Self-pay | Admitting: Unknown Physician Specialty

## 2014-02-14 DIAGNOSIS — G4733 Obstructive sleep apnea (adult) (pediatric): Secondary | ICD-10-CM | POA: Diagnosis not present

## 2014-02-14 DIAGNOSIS — F5101 Primary insomnia: Secondary | ICD-10-CM | POA: Diagnosis not present

## 2014-03-13 DIAGNOSIS — N400 Enlarged prostate without lower urinary tract symptoms: Secondary | ICD-10-CM | POA: Insufficient documentation

## 2014-03-13 DIAGNOSIS — E66811 Obesity, class 1: Secondary | ICD-10-CM | POA: Insufficient documentation

## 2014-03-13 DIAGNOSIS — E669 Obesity, unspecified: Secondary | ICD-10-CM | POA: Insufficient documentation

## 2014-03-13 DIAGNOSIS — K219 Gastro-esophageal reflux disease without esophagitis: Secondary | ICD-10-CM | POA: Insufficient documentation

## 2014-03-17 ENCOUNTER — Telehealth: Payer: Self-pay | Admitting: Internal Medicine

## 2014-03-17 DIAGNOSIS — M25512 Pain in left shoulder: Secondary | ICD-10-CM | POA: Diagnosis not present

## 2014-03-17 DIAGNOSIS — M75112 Incomplete rotator cuff tear or rupture of left shoulder, not specified as traumatic: Secondary | ICD-10-CM | POA: Diagnosis not present

## 2014-03-17 DIAGNOSIS — K219 Gastro-esophageal reflux disease without esophagitis: Secondary | ICD-10-CM | POA: Diagnosis not present

## 2014-03-17 DIAGNOSIS — N400 Enlarged prostate without lower urinary tract symptoms: Secondary | ICD-10-CM | POA: Diagnosis not present

## 2014-03-17 DIAGNOSIS — M19012 Primary osteoarthritis, left shoulder: Secondary | ICD-10-CM | POA: Diagnosis not present

## 2014-03-17 DIAGNOSIS — M66822 Spontaneous rupture of other tendons, left upper arm: Secondary | ICD-10-CM | POA: Diagnosis not present

## 2014-03-17 DIAGNOSIS — M67919 Unspecified disorder of synovium and tendon, unspecified shoulder: Secondary | ICD-10-CM | POA: Diagnosis not present

## 2014-03-17 DIAGNOSIS — Z6830 Body mass index (BMI) 30.0-30.9, adult: Secondary | ICD-10-CM | POA: Diagnosis not present

## 2014-03-17 DIAGNOSIS — E669 Obesity, unspecified: Secondary | ICD-10-CM | POA: Diagnosis not present

## 2014-03-17 DIAGNOSIS — M7522 Bicipital tendinitis, left shoulder: Secondary | ICD-10-CM | POA: Diagnosis not present

## 2014-03-17 DIAGNOSIS — G8918 Other acute postprocedural pain: Secondary | ICD-10-CM | POA: Diagnosis not present

## 2014-03-17 DIAGNOSIS — M25712 Osteophyte, left shoulder: Secondary | ICD-10-CM | POA: Diagnosis not present

## 2014-03-17 DIAGNOSIS — M75122 Complete rotator cuff tear or rupture of left shoulder, not specified as traumatic: Secondary | ICD-10-CM | POA: Diagnosis not present

## 2014-03-17 DIAGNOSIS — M7502 Adhesive capsulitis of left shoulder: Secondary | ICD-10-CM | POA: Diagnosis not present

## 2014-03-17 DIAGNOSIS — Z79899 Other long term (current) drug therapy: Secondary | ICD-10-CM | POA: Diagnosis not present

## 2014-03-17 NOTE — Telephone Encounter (Signed)
Please check on him tomorrow and make sure he is okay Is he home?

## 2014-03-17 NOTE — Telephone Encounter (Signed)
Pt had shoulder surgery earlier today and has pain pump for 3 days to manage pain instead of nerve block; when pt sits in chair has difficulty breathing. 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 PA wants to know which side. Dr Silvio Pate said right side. 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 Pt said he is not in distress right now and will go to UC if needed.

## 2014-03-17 NOTE — Telephone Encounter (Signed)
ERROR

## 2014-03-18 NOTE — Telephone Encounter (Signed)
Spoke with pt and he is doing a lot better; Surgeon, Dr Modena Morrow at [facility]. told pt to stop taking the major nerve block thru the pain pump. After pt stopped pain pump pt began to breathe better; today pt is breathing OK. Pt is taking oxycodone orally q 8 h prn for pain and that is managing pain. Pt is appreciative for call. Surgery was same day and pt is at home.

## 2014-03-18 NOTE — Telephone Encounter (Signed)
Good to hear I then got a discharge summary and it told about the nerve block. That could definitely have affected his breathing

## 2014-04-07 ENCOUNTER — Encounter: Payer: Self-pay | Admitting: Internal Medicine

## 2014-04-07 ENCOUNTER — Ambulatory Visit (INDEPENDENT_AMBULATORY_CARE_PROVIDER_SITE_OTHER): Payer: Medicare Other | Admitting: Internal Medicine

## 2014-04-07 VITALS — BP 110/70 | HR 75 | Temp 98.1°F | Wt 239.0 lb

## 2014-04-07 DIAGNOSIS — Z9889 Other specified postprocedural states: Secondary | ICD-10-CM | POA: Diagnosis not present

## 2014-04-07 DIAGNOSIS — G4733 Obstructive sleep apnea (adult) (pediatric): Secondary | ICD-10-CM | POA: Diagnosis not present

## 2014-04-07 DIAGNOSIS — M25512 Pain in left shoulder: Secondary | ICD-10-CM | POA: Diagnosis not present

## 2014-04-07 MED ORDER — LORAZEPAM 0.5 MG PO TABS: 0.5000 mg | ORAL_TABLET | Freq: Every evening | ORAL | Status: DC | PRN

## 2014-04-07 NOTE — Progress Notes (Signed)
Pre visit review using our clinic review tool, if applicable. No additional management support is needed unless otherwise documented below in the visit note. 

## 2014-04-07 NOTE — Assessment & Plan Note (Signed)
Better now since starting the CPAP Still with sleep problems Trazodone no help Will continue the lorazepam --asked him to try to skip days if he can

## 2014-04-07 NOTE — Progress Notes (Signed)
Subjective:    Patient ID: RAYGEN DAHM, male    DOB: 06/11/1940, 73 y.o.   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 acetaminophen (TYLENOL) 500 MG tablet Take 500 mg by mouth daily.    Marland Kitchen aspirin 81 MG tablet Take 81 mg by mouth daily.      Marland Kitchen doxazosin (CARDURA) 4 MG tablet Take 1 tablet (4 mg total) by mouth at bedtime. 90 tablet 3  . esomeprazole (NEXIUM) 40 MG capsule Take 1 capsule (40 mg total) by mouth daily before breakfast. 90 capsule 1  . finasteride (PROSCAR) 5 MG tablet Take 1 tablet (5 mg total) by mouth daily. 90 tablet 3  . LORazepam (ATIVAN) 0.5 MG tablet Take 1 tablet (0.5 mg total) by mouth at bedtime as needed. 30 tablet 0   No current facility-administered medications on file prior to visit.    Allergies  Allergen Reactions  . Atorvastatin     REACTION: myalgias  . Simvastatin     REACTION: joint and stomach pain  . Latex Rash    Local area    Past Medical History  Diagnosis Date  . Allergy   . Diverticulitis   . GERD (gastroesophageal reflux disease)   . Hyperlipidemia   . Anxiety   . Hypertension   . Osteoarthritis   . Spinal stenosis   . BPH (benign prostatic hypertrophy)   . Kidney stones     Past Surgical History  Procedure Laterality Date  . Tendon repair      Left knee tendon repair--1966  . Vasectomy      Vasectomy/spermatocele/?testicular cyst--1970  . Rotator cuff repair      R rotator  cuff repair--1/00  Noemi Chapel)  . Eye muscle surgery      L eye muscle surgery--2/01  . Esophagogastroduodenoscopy      colon--6/04, Barrett's--7/05, Barrett's but no cancer--4/08  . Knee arthroscopy      7/ 09 Arthroscopy left knee--Dr Noemi Chapel  . Joint replacement  1/12    left TKR  . Lipoma excision Left 2010    compressing nerve by spine---[facility] at [facility]  . Rotator cuff repair Left ~2005 & 11/15    biceps repair also in 2015    Family History  Problem Relation Age of Onset  . Hypertension Mother   . Cancer Mother     breast cancer  . Coronary artery disease Neg Hx   . Diabetes Neg Hx     History   Social History  . Marital Status: Married    Spouse Name: N/A    Number of Children: 3  . Years of Education: N/A   Occupational History  . retired    Social History Main Topics  . Smoking status: Never Smoker   . Smokeless tobacco: Never Used  . Alcohol Use: Yes     Comment: occassional wine  . Drug Use: No  . Sexual Activity: Not on file   Other Topics  Concern  . Not on file   Social History Narrative   Lives at home with wife         Review of Systems Trying to cut back on eating Wants to lose weight--- did lose 1# over 3 months    Objective:   Physical Exam  Constitutional: He appears well-developed and well-nourished. No distress.  Psychiatric: He has a normal mood and affect. His behavior is normal.          Assessment & Plan:

## 2014-04-10 DIAGNOSIS — Z9889 Other specified postprocedural states: Secondary | ICD-10-CM | POA: Diagnosis not present

## 2014-04-10 DIAGNOSIS — M25512 Pain in left shoulder: Secondary | ICD-10-CM | POA: Diagnosis not present

## 2014-04-14 DIAGNOSIS — G4733 Obstructive sleep apnea (adult) (pediatric): Secondary | ICD-10-CM | POA: Diagnosis not present

## 2014-04-14 DIAGNOSIS — Z9889 Other specified postprocedural states: Secondary | ICD-10-CM | POA: Diagnosis not present

## 2014-04-14 DIAGNOSIS — M25512 Pain in left shoulder: Secondary | ICD-10-CM | POA: Diagnosis not present

## 2014-04-14 DIAGNOSIS — J309 Allergic rhinitis, unspecified: Secondary | ICD-10-CM | POA: Diagnosis not present

## 2014-04-18 DIAGNOSIS — M25512 Pain in left shoulder: Secondary | ICD-10-CM | POA: Diagnosis not present

## 2014-04-18 DIAGNOSIS — Z9889 Other specified postprocedural states: Secondary | ICD-10-CM | POA: Diagnosis not present

## 2014-04-21 DIAGNOSIS — Z9889 Other specified postprocedural states: Secondary | ICD-10-CM | POA: Diagnosis not present

## 2014-04-21 DIAGNOSIS — M25512 Pain in left shoulder: Secondary | ICD-10-CM | POA: Diagnosis not present

## 2014-04-25 DIAGNOSIS — M25512 Pain in left shoulder: Secondary | ICD-10-CM | POA: Diagnosis not present

## 2014-04-25 DIAGNOSIS — Z9889 Other specified postprocedural states: Secondary | ICD-10-CM | POA: Diagnosis not present

## 2014-04-29 DIAGNOSIS — Z9889 Other specified postprocedural states: Secondary | ICD-10-CM | POA: Diagnosis not present

## 2014-04-29 DIAGNOSIS — M25512 Pain in left shoulder: Secondary | ICD-10-CM | POA: Diagnosis not present

## 2014-05-05 DIAGNOSIS — Z9889 Other specified postprocedural states: Secondary | ICD-10-CM | POA: Diagnosis not present

## 2014-05-05 DIAGNOSIS — M25512 Pain in left shoulder: Secondary | ICD-10-CM | POA: Diagnosis not present

## 2014-05-08 DIAGNOSIS — Z9889 Other specified postprocedural states: Secondary | ICD-10-CM | POA: Diagnosis not present

## 2014-05-08 DIAGNOSIS — M25512 Pain in left shoulder: Secondary | ICD-10-CM | POA: Diagnosis not present

## 2014-05-12 ENCOUNTER — Telehealth: Payer: Self-pay | Admitting: Internal Medicine

## 2014-05-12 MED ORDER — LORAZEPAM 0.5 MG PO TABS: 0.5000 mg | ORAL_TABLET | Freq: Every evening | ORAL | Status: DC | PRN

## 2014-05-12 NOTE — Telephone Encounter (Signed)
Okay #30 x 0 

## 2014-05-12 NOTE — Telephone Encounter (Signed)
Pt came in requested Lorazepam .5mg  refill. Pt is out of medication completely.  [facility]  [phone]

## 2014-05-12 NOTE — Telephone Encounter (Signed)
rx called into pharmacy

## 2014-05-14 DIAGNOSIS — Z9889 Other specified postprocedural states: Secondary | ICD-10-CM | POA: Diagnosis not present

## 2014-05-14 DIAGNOSIS — M25512 Pain in left shoulder: Secondary | ICD-10-CM | POA: Diagnosis not present

## 2014-05-15 DIAGNOSIS — R972 Elevated prostate specific antigen [PSA]: Secondary | ICD-10-CM | POA: Diagnosis not present

## 2014-05-19 DIAGNOSIS — R972 Elevated prostate specific antigen [PSA]: Secondary | ICD-10-CM | POA: Diagnosis not present

## 2014-05-19 DIAGNOSIS — Z9889 Other specified postprocedural states: Secondary | ICD-10-CM | POA: Diagnosis not present

## 2014-05-19 DIAGNOSIS — R1030 Lower abdominal pain, unspecified: Secondary | ICD-10-CM | POA: Diagnosis not present

## 2014-05-19 DIAGNOSIS — M25512 Pain in left shoulder: Secondary | ICD-10-CM | POA: Diagnosis not present

## 2014-05-19 DIAGNOSIS — R351 Nocturia: Secondary | ICD-10-CM | POA: Diagnosis not present

## 2014-05-19 DIAGNOSIS — N401 Enlarged prostate with lower urinary tract symptoms: Secondary | ICD-10-CM | POA: Diagnosis not present

## 2014-05-22 DIAGNOSIS — M25512 Pain in left shoulder: Secondary | ICD-10-CM | POA: Diagnosis not present

## 2014-05-22 DIAGNOSIS — Z9889 Other specified postprocedural states: Secondary | ICD-10-CM | POA: Diagnosis not present

## 2014-05-28 DIAGNOSIS — M25512 Pain in left shoulder: Secondary | ICD-10-CM | POA: Diagnosis not present

## 2014-05-28 DIAGNOSIS — Z9889 Other specified postprocedural states: Secondary | ICD-10-CM | POA: Diagnosis not present

## 2014-06-04 DIAGNOSIS — M25512 Pain in left shoulder: Secondary | ICD-10-CM | POA: Diagnosis not present

## 2014-06-04 DIAGNOSIS — Z9889 Other specified postprocedural states: Secondary | ICD-10-CM | POA: Diagnosis not present

## 2014-06-11 DIAGNOSIS — M25512 Pain in left shoulder: Secondary | ICD-10-CM | POA: Diagnosis not present

## 2014-06-11 DIAGNOSIS — Z9889 Other specified postprocedural states: Secondary | ICD-10-CM | POA: Diagnosis not present

## 2014-06-25 DIAGNOSIS — Z9889 Other specified postprocedural states: Secondary | ICD-10-CM | POA: Diagnosis not present

## 2014-06-25 DIAGNOSIS — M25512 Pain in left shoulder: Secondary | ICD-10-CM | POA: Diagnosis not present

## 2014-07-03 ENCOUNTER — Ambulatory Visit: Payer: Self-pay | Admitting: Unknown Physician Specialty

## 2014-07-03 DIAGNOSIS — G4733 Obstructive sleep apnea (adult) (pediatric): Secondary | ICD-10-CM | POA: Diagnosis not present

## 2014-07-08 ENCOUNTER — Encounter: Payer: Self-pay | Admitting: Gastroenterology

## 2014-07-09 DIAGNOSIS — M25512 Pain in left shoulder: Secondary | ICD-10-CM | POA: Diagnosis not present

## 2014-07-09 DIAGNOSIS — Z9889 Other specified postprocedural states: Secondary | ICD-10-CM | POA: Diagnosis not present

## 2014-07-14 DIAGNOSIS — G4733 Obstructive sleep apnea (adult) (pediatric): Secondary | ICD-10-CM | POA: Diagnosis not present

## 2014-07-23 DIAGNOSIS — M1712 Unilateral primary osteoarthritis, left knee: Secondary | ICD-10-CM | POA: Diagnosis not present

## 2014-07-23 DIAGNOSIS — M1711 Unilateral primary osteoarthritis, right knee: Secondary | ICD-10-CM | POA: Diagnosis not present

## 2014-07-23 DIAGNOSIS — M1710 Unilateral primary osteoarthritis, unspecified knee: Secondary | ICD-10-CM | POA: Insufficient documentation

## 2014-07-23 DIAGNOSIS — M179 Osteoarthritis of knee, unspecified: Secondary | ICD-10-CM | POA: Insufficient documentation

## 2014-07-23 DIAGNOSIS — Z96652 Presence of left artificial knee joint: Secondary | ICD-10-CM | POA: Diagnosis not present

## 2014-07-23 DIAGNOSIS — Z79899 Other long term (current) drug therapy: Secondary | ICD-10-CM | POA: Diagnosis not present

## 2014-07-24 DIAGNOSIS — B351 Tinea unguium: Secondary | ICD-10-CM | POA: Diagnosis not present

## 2014-07-24 DIAGNOSIS — M79675 Pain in left toe(s): Secondary | ICD-10-CM | POA: Diagnosis not present

## 2014-07-24 DIAGNOSIS — M79674 Pain in right toe(s): Secondary | ICD-10-CM | POA: Diagnosis not present

## 2014-07-29 ENCOUNTER — Other Ambulatory Visit: Payer: Self-pay | Admitting: *Deleted

## 2014-07-29 MED ORDER — ESOMEPRAZOLE MAGNESIUM 40 MG PO CPDR: 40.0000 mg | DELAYED_RELEASE_CAPSULE | Freq: Every day | ORAL | Status: DC

## 2014-07-29 NOTE — Telephone Encounter (Signed)
Patient called requesting a refill on his Nexium. Prescription sent to pharmacy electronically per patient's request. Patient notified by telephone.

## 2014-07-30 DIAGNOSIS — M25512 Pain in left shoulder: Secondary | ICD-10-CM | POA: Diagnosis not present

## 2014-07-30 DIAGNOSIS — Z9889 Other specified postprocedural states: Secondary | ICD-10-CM | POA: Diagnosis not present

## 2014-08-06 ENCOUNTER — Ambulatory Visit (INDEPENDENT_AMBULATORY_CARE_PROVIDER_SITE_OTHER): Payer: Medicare Other | Admitting: Internal Medicine

## 2014-08-06 ENCOUNTER — Encounter: Payer: Self-pay | Admitting: Internal Medicine

## 2014-08-06 VITALS — BP 138/80 | HR 78 | Temp 97.7°F | Wt 245.0 lb

## 2014-08-06 DIAGNOSIS — R51 Headache: Secondary | ICD-10-CM

## 2014-08-06 DIAGNOSIS — R519 Headache, unspecified: Secondary | ICD-10-CM | POA: Insufficient documentation

## 2014-08-06 MED ORDER — DICLOFENAC SODIUM 75 MG PO TBEC: 75.0000 mg | DELAYED_RELEASE_TABLET | Freq: Two times a day (BID) | ORAL | Status: DC

## 2014-08-06 MED ORDER — LORAZEPAM 0.5 MG PO TABS: 0.5000 mg | ORAL_TABLET | Freq: Every evening | ORAL | Status: DC | PRN

## 2014-08-06 NOTE — Addendum Note (Signed)
Addended by: Despina Hidden on: 08/06/2014 08:55 AM   Modules accepted: Orders

## 2014-08-06 NOTE — Progress Notes (Signed)
Pre visit review using our clinic review tool, if applicable. No additional management support is needed unless otherwise documented below in the visit note. 

## 2014-08-06 NOTE — Assessment & Plan Note (Signed)
Frontal and on top of head Pattern not consistent with intracranial process Doesn't seem to be muscle tension--but there may be some component of this No clear reason for it getting better when he lies down and goes to sleep unless the allegra is helping No reason for labs or imaging studies  Reassured  Try allegra earlier in day May want to alter routine--take siesta after his main midday meal Monitor for now

## 2014-08-06 NOTE — Progress Notes (Signed)
Subjective:    Patient ID: [name], male    DOB: 1940/05/26, 74 y.o.   MRN: [id number]  HPI Here due to headache Has been having headaches starting 5-6PM ---noticed it over the past few months Will finally go away when he puts his CPAP mask on and goes to sleep Takes allegra right at bedtime--- but not long before he lies down Frontal and on top of head--not in neck (since shoulder healed)  Only eats light meal in evening  Notices it when he sits to relax Not pounding, not really pressure sensation  He spends the day doing "small stuff" Does sit at computer a fair amount of time Networks with friends at [employer], etc  Has gotten back to the gym--but limited due to knee No vision changes except reading is not as clear No focal weakness No speech or swallowing problems Occasionally awakens with left hand numbness  Current Outpatient Prescriptions on File Prior to Visit  Medication Sig Dispense Refill  . acetaminophen (TYLENOL) 500 MG tablet Take 500 mg by mouth daily.    Marland Kitchen aspirin 81 MG tablet Take 81 mg by mouth daily.      . diclofenac (VOLTAREN) 75 MG EC tablet Take 75 mg by mouth 2 (two) times daily.   0  . doxazosin (CARDURA) 4 MG tablet Take 1 tablet (4 mg total) by mouth at bedtime. 90 tablet 3  . esomeprazole (NEXIUM) 40 MG capsule Take 1 capsule (40 mg total) by mouth daily before breakfast. 90 capsule 2  . finasteride (PROSCAR) 5 MG tablet Take 1 tablet (5 mg total) by mouth daily. 90 tablet 3  . fluticasone (FLONASE) 50 MCG/ACT nasal spray Place 2 sprays into the nose daily.     Marland Kitchen LORazepam (ATIVAN) 0.5 MG tablet Take 1 tablet (0.5 mg total) by mouth at bedtime as needed. 30 tablet 0   No current facility-administered medications on file prior to visit.    Allergies  Allergen Reactions  . Atorvastatin     REACTION: myalgias  . Simvastatin     REACTION: joint and stomach pain  . Latex Rash    Local area    Past Medical History  Diagnosis Date    . Allergy   . Diverticulitis   . GERD (gastroesophageal reflux disease)   . Hyperlipidemia   . Anxiety   . Hypertension   . Osteoarthritis   . Spinal stenosis   . BPH (benign prostatic hypertrophy)   . Kidney stones   . Obstructive sleep apnea     Past Surgical History  Procedure Laterality Date  . Tendon repair      Left knee tendon repair--1966  . Vasectomy      Vasectomy/spermatocele/?testicular cyst--1970  . Rotator cuff repair      R rotator cuff repair--1/00  Noemi Chapel)  . Eye muscle surgery      L eye muscle surgery--2/01  . Esophagogastroduodenoscopy      colon--6/04, Barrett's--7/05, Barrett's but no cancer--4/08  . Knee arthroscopy      7/ 09 Arthroscopy left knee--Dr Noemi Chapel  . Joint replacement  1/12    left TKR  . Lipoma excision Left 2010    compressing nerve by spine---Dr Terald Sleeper at [facility]  . Rotator cuff repair Left ~2005 & 11/15    biceps repair also in 2015    Family History  Problem Relation Age of Onset  . Hypertension Mother   . Cancer Mother     breast cancer  . Coronary  artery disease Neg Hx   . Diabetes Neg Hx     History   Social History  . Marital Status: Married    Spouse Name: N/A  . Number of Children: 3  . Years of Education: N/A   Occupational History  . retired    Social History Main Topics  . Smoking status: Never Smoker   . Smokeless tobacco: Never Used  . Alcohol Use: Yes     Comment: occassional wine  . Drug Use: No  . Sexual Activity: Not on file   Other Topics Concern  . Not on file   Social History Narrative   Lives at home with wife         Review of Systems Concern that CPAP is not working--now expecting new BiPap machine soon Having TKR 4/14    Objective:   Physical Exam  Constitutional: He is oriented to person, place, and time. He appears well-developed and well-nourished. No distress.  HENT:  Mouth/Throat: Oropharynx is clear and moist. No oropharyngeal exudate.  No sinus tenderness Mild pale  nasal congestion  Eyes: Conjunctivae and EOM are normal. Pupils are equal, round, and reactive to light.  Neck: No thyromegaly present.  Mild limitation of ROM in all spheres  Lymphadenopathy:    He has no cervical adenopathy.  Neurological: He is alert and oriented to person, place, and time. He has normal strength. He displays no atrophy and no tremor. No cranial nerve deficit. He exhibits normal muscle tone. He displays a negative Romberg sign. Coordination and gait normal.          Assessment & Plan:

## 2014-08-08 HISTORY — PX: TOTAL KNEE ARTHROPLASTY: SHX125

## 2014-08-11 ENCOUNTER — Ambulatory Visit (INDEPENDENT_AMBULATORY_CARE_PROVIDER_SITE_OTHER): Payer: Medicare Other | Admitting: Physician Assistant

## 2014-08-11 ENCOUNTER — Encounter: Payer: Self-pay | Admitting: Physician Assistant

## 2014-08-11 ENCOUNTER — Ambulatory Visit (HOSPITAL_COMMUNITY): Payer: Medicare Other | Attending: Cardiology | Admitting: Cardiology

## 2014-08-11 VITALS — BP 122/78 | HR 77 | Ht 69.5 in | Wt 243.8 lb

## 2014-08-11 DIAGNOSIS — Z01818 Encounter for other preprocedural examination: Secondary | ICD-10-CM | POA: Insufficient documentation

## 2014-08-11 DIAGNOSIS — R011 Cardiac murmur, unspecified: Secondary | ICD-10-CM | POA: Diagnosis not present

## 2014-08-11 DIAGNOSIS — E669 Obesity, unspecified: Secondary | ICD-10-CM

## 2014-08-11 DIAGNOSIS — I10 Essential (primary) hypertension: Secondary | ICD-10-CM

## 2014-08-11 NOTE — Assessment & Plan Note (Signed)
Patient quit statins 4 years ago because of joint pain. His last lipids were done last May. LDL was 158. Recommend low-fat diet and follow-up with Dr. Percival Spanish.

## 2014-08-11 NOTE — Progress Notes (Signed)
Cardiology Office Note   Date:  08/11/2014   ID:  [name], DOB [birth year], MRN [id number]  PCP:  Viviana Simpler, MD  Cardiologist:  Minus Breeding, M.D.  Chief Complaint: Presurgical clearance    History of Present Illness: [name] is a 74 y.o. male who presents for preoperative clearance before undergoing knee replacement by Dr Foster Simpson at [facility]. He was last seen by Dr.Hochrein 01/07/14 for cardiac clearance prior to undergoing shoulder surgery. He was cleared without further cardiac testing. Patient has a history of an abnormal stress test with ischemia on perfusion study. However in 2010 he underwent cardiac catheterization that demonstrated no CAD. He also has hypertension.  Patient did well after his shoulder surgery except he had trouble with the pain pump that was given to him postop. Once this was disconnected he was fine. He denies any chest pain, palpitations, dyspnea, dyspnea on exertion, dizziness, or presyncope. He is unable to walk 100 feet before his knee gives out. He is being put on BiPAP because of C Pap wasn't helping him.    Past Medical History  Diagnosis Date  . Allergy   . Diverticulitis   . GERD (gastroesophageal reflux disease)   . Hyperlipidemia   . Anxiety   . Hypertension   . Osteoarthritis   . Spinal stenosis   . BPH (benign prostatic hypertrophy)   . Kidney stones   . Obstructive sleep apnea     Past Surgical History  Procedure Laterality Date  . Tendon repair      Left knee tendon repair--1966  . Vasectomy      Vasectomy/spermatocele/?testicular cyst--1970  . Rotator cuff repair      R rotator cuff repair--1/00  Noemi Chapel)  . Eye muscle surgery      L eye muscle surgery--2/01  . Esophagogastroduodenoscopy      colon--6/04, Barrett's--7/05, Barrett's but no cancer--4/08  . Knee arthroscopy      7/ 09 Arthroscopy left knee--Dr Noemi Chapel  . Joint replacement  1/12    left TKR  . Lipoma excision Left 2010    compressing nerve  by spine---[facility] Terald [facility]  . Rotator cuff repair Left ~2005 & 11/15    biceps repair also in 2015     Current Outpatient Prescriptions  Medication Sig Dispense Refill  . acetaminophen (TYLENOL) 500 MG tablet Take 500 mg by mouth every 8 (eight) hours as needed (for pain).     Marland Kitchen aspirin 81 MG tablet Take 81 mg by mouth daily.      . diclofenac (VOLTAREN) 75 MG EC tablet Take 1 tablet (75 mg total) by mouth 2 (two) times daily. 60 tablet 11  . doxazosin (CARDURA) 4 MG tablet Take 1 tablet (4 mg total) by mouth at bedtime. 90 tablet 3  . esomeprazole (NEXIUM) 40 MG capsule Take 1 capsule (40 mg total) by mouth daily before breakfast. 90 capsule 2  . finasteride (PROSCAR) 5 MG tablet Take 1 tablet (5 mg total) by mouth daily. 90 tablet 3  . fluticasone (FLONASE) 50 MCG/ACT nasal spray Place 2 sprays into the nose daily as needed (for nasal congestion at bedtime).     . LORazepam (ATIVAN) 0.5 MG tablet Take 1 tablet (0.5 mg total) by mouth at bedtime as needed. (Patient taking differently: Take 0.5 mg by mouth at bedtime. ) 30 tablet 0   No current facility-administered medications for this visit.    Allergies:   Atorvastatin; Simvastatin; and Latex    Social History:  The patient  reports that he has never smoked. He has never used smokeless tobacco. He reports that he drinks alcohol. He reports that he does not use illicit drugs.   Family History:  The patient's family history includes Cancer in his mother; Hypertension in his mother. There is no history of Coronary artery disease or Diabetes.    ROS:  Please see the history of present illness.   Otherwise, review of systems are positive for none.   All other systems are reviewed and negative.    PHYSICAL EXAM: BP 122/78 mmHg  Pulse 77  Ht 5' 9.5" (1.765 m)  Wt 243 lb 12.8 oz (110.587 kg)  BMI 35.50 kg/m2 GEN: Obese, well developed, in no acute distress Neck: no JVD, HJR, carotid bruits, or masses Cardiac: RRR; 2/6 systolic  murmur at the left sternal border, no gallop, rubs, thrill or heave,  Respiratory:  clear to auscultation bilaterally, normal work of breathing GI: soft, nontender, nondistended, + BS MS: no deformity or atrophy Extremities: without cyanosis, clubbing, edema, good distal pulses bilaterally.  Skin: warm and dry, no rash Neuro:  Strength and sensation are intact    EKG:  EKG is ordered today. 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, no acute change from last EKG in September 2015   Recent Labs: 08/20/2013: ALT 16; BUN 12; Creatinine 0.8; Hemoglobin 14.2; Platelets 208.0; Potassium 4.4; Sodium 139; TSH 1.18    Lipid Panel    Component Value Date/Time   CHOL 216* 08/20/2013 0943   TRIG 100.0 08/20/2013 0943   HDL 38.00* 08/20/2013 0943   CHOLHDL 6 08/20/2013 0943   VLDL 20.0 08/20/2013 0943   LDLCALC 158* 08/20/2013 0943   LDLDIRECT 157.4 08/16/2012 1626      Wt Readings from Last 3 Encounters:  08/06/14 245 lb (111.131 kg)  04/07/14 239 lb (108.41 kg)  01/07/14 240 lb (108.863 kg)      Other studies Reviewed: Additional studies/ records that were reviewed today include and review of the records demonstrates:   FINDINGS:  1. Hemodynamics:  LV 118/13, aorta 103/59.  I do not think that there      is a true gradient across the aortic valve.  2. Left ventriculography:  EF was 55%.  There were no wall motion      abnormalities.  No significant mitral regurgitation.  3. RCA:  The right coronary artery was a small vessel that does appear      to be codominant.  There is a small right-sided PDA..  4. The left main had no angiographic coronary artery disease.  5. The left circumflex was a large codominant vessel that provided a      small first obtuse marginal, a moderate-sized second obtuse      marginal, and a left-sided PDA.  The circumflex system was free      from obstructive coronary artery disease.   6. LAD system:  The LAD system was free from obstructive coronary      artery disease.  There was a large early first diagonal and a      smaller second diagonal.    ASSESSMENT/PLAN:  There is no angiographic coronary artery disease  noted.  The patient has normal left ventricular systolic function.  No  further testing is needed prior to surgery.        Loralie Champagne, MD  Electronically Signed      DM/MEDQ  D:  12/26/2008  T:  12/27/2008  Job:  [id number]     ASSESSMENT AND PLAN: Preoperative clearance Patient is here for preoperative clearance before undergoing right knee replacement at [facility]. He has history of no CAD on cath in 2010 after an abnormal stress test. He recently underwent left shoulder surgery without difficulty back in September 2015 he has had no change in his symptoms. He is unable to walk more than 100 feet because of his knee giving out. I discussed this patient detail with Dr. Meda Coffee who concurs that he is at acceptable risk for the planned procedure without further cardiovascular testing. He does have a mild heart murmur which we will get a 2-D echo to evaluate but this does not have to be done prior to his knee surgery.   Obesity Weight loss recommended.   HYPERLIPIDEMIA Patient quit statins 4 years ago because of joint pain. His last lipids were done last May. LDL was 158. Recommend low-fat diet and follow-up with Dr. Percival Spanish.   Essential hypertension Blood pressure stable      Signed, Ermalinda Barrios, PA-C  08/11/2014 1:44 PM    [facility] [address], [place], [place]  [address] Phone: [phone]; Fax: [phone]

## 2014-08-11 NOTE — Patient Instructions (Addendum)
Your physician has requested that you have an echocardiogram. Echocardiography is a painless test that uses sound waves to create images of your heart. It provides your doctor with information about the size and shape of your heart and how well your heart's chambers and valves are working. This procedure takes approximately one hour. There are no restrictions for this procedure.  Will fax a note over to your Doctor at [facility], you are cleared from a cardiac standpoint for surgery.  Your physician wants you to follow-up in: 3 months with Dr. Percival Spanish at [facility].  You will receive a reminder letter in the mail two months in advance. If you don't receive a letter, please call our office to schedule the follow-up appointment.

## 2014-08-11 NOTE — Progress Notes (Signed)
Echo performed. 

## 2014-08-11 NOTE — Assessment & Plan Note (Signed)
Patient is here for preoperative clearance before undergoing right knee replacement at [facility]. He has history of no CAD on cath in 2010 after an abnormal stress test. He recently underwent left shoulder surgery without difficulty back in September 2015 he has had no change in his symptoms. He is unable to walk more than 100 feet because of his knee giving out. I discussed this patient detail with Dr. Meda Coffee who concurs that he is at acceptable risk for the planned procedure without further cardiovascular testing. He does have a mild heart murmur which we will get a 2-D echo to evaluate but this does not have to be done prior to his knee surgery.

## 2014-08-11 NOTE — Assessment & Plan Note (Signed)
Weight loss recommended 

## 2014-08-11 NOTE — Assessment & Plan Note (Signed)
Blood pressure stable ? ?

## 2014-08-13 DIAGNOSIS — H6123 Impacted cerumen, bilateral: Secondary | ICD-10-CM | POA: Diagnosis not present

## 2014-08-13 DIAGNOSIS — H9209 Otalgia, unspecified ear: Secondary | ICD-10-CM | POA: Diagnosis not present

## 2014-08-13 DIAGNOSIS — G4733 Obstructive sleep apnea (adult) (pediatric): Secondary | ICD-10-CM | POA: Diagnosis not present

## 2014-08-14 ENCOUNTER — Telehealth: Payer: Self-pay | Admitting: Physician Assistant

## 2014-08-14 NOTE — Telephone Encounter (Signed)
Faxed last office note and echo report to Dr. Foster Simpson at [phone] and called [facility] to confirm that this was received.

## 2014-08-14 NOTE — Telephone Encounter (Signed)
New message       Please refax clearance note to [phone].  They did not get it

## 2014-08-18 DIAGNOSIS — Z01818 Encounter for other preprocedural examination: Secondary | ICD-10-CM | POA: Diagnosis not present

## 2014-08-21 DIAGNOSIS — N400 Enlarged prostate without lower urinary tract symptoms: Secondary | ICD-10-CM | POA: Diagnosis present

## 2014-08-21 DIAGNOSIS — M25561 Pain in right knee: Secondary | ICD-10-CM | POA: Diagnosis not present

## 2014-08-21 DIAGNOSIS — Z6830 Body mass index (BMI) 30.0-30.9, adult: Secondary | ICD-10-CM | POA: Diagnosis not present

## 2014-08-21 DIAGNOSIS — E669 Obesity, unspecified: Secondary | ICD-10-CM | POA: Diagnosis present

## 2014-08-21 DIAGNOSIS — M1711 Unilateral primary osteoarthritis, right knee: Secondary | ICD-10-CM | POA: Diagnosis not present

## 2014-08-21 DIAGNOSIS — G8918 Other acute postprocedural pain: Secondary | ICD-10-CM | POA: Diagnosis not present

## 2014-08-21 DIAGNOSIS — K219 Gastro-esophageal reflux disease without esophagitis: Secondary | ICD-10-CM | POA: Diagnosis present

## 2014-08-21 DIAGNOSIS — M75120 Complete rotator cuff tear or rupture of unspecified shoulder, not specified as traumatic: Secondary | ICD-10-CM | POA: Diagnosis present

## 2014-08-22 ENCOUNTER — Encounter: Payer: Medicare Other | Admitting: Internal Medicine

## 2014-08-25 DIAGNOSIS — Z96651 Presence of right artificial knee joint: Secondary | ICD-10-CM | POA: Diagnosis not present

## 2014-08-25 DIAGNOSIS — R2689 Other abnormalities of gait and mobility: Secondary | ICD-10-CM | POA: Diagnosis not present

## 2014-08-25 DIAGNOSIS — Z471 Aftercare following joint replacement surgery: Secondary | ICD-10-CM | POA: Diagnosis not present

## 2014-08-26 ENCOUNTER — Encounter: Payer: Medicare Other | Admitting: Internal Medicine

## 2014-08-26 ENCOUNTER — Ambulatory Visit: Payer: Medicare Other | Admitting: Gastroenterology

## 2014-08-27 DIAGNOSIS — R2689 Other abnormalities of gait and mobility: Secondary | ICD-10-CM | POA: Diagnosis not present

## 2014-08-27 DIAGNOSIS — Z96651 Presence of right artificial knee joint: Secondary | ICD-10-CM | POA: Diagnosis not present

## 2014-08-27 DIAGNOSIS — Z471 Aftercare following joint replacement surgery: Secondary | ICD-10-CM | POA: Diagnosis not present

## 2014-08-29 DIAGNOSIS — Z96651 Presence of right artificial knee joint: Secondary | ICD-10-CM | POA: Diagnosis not present

## 2014-08-29 DIAGNOSIS — Z471 Aftercare following joint replacement surgery: Secondary | ICD-10-CM | POA: Diagnosis not present

## 2014-08-29 DIAGNOSIS — R2689 Other abnormalities of gait and mobility: Secondary | ICD-10-CM | POA: Diagnosis not present

## 2014-09-01 DIAGNOSIS — R2689 Other abnormalities of gait and mobility: Secondary | ICD-10-CM | POA: Diagnosis not present

## 2014-09-01 DIAGNOSIS — Z96651 Presence of right artificial knee joint: Secondary | ICD-10-CM | POA: Diagnosis not present

## 2014-09-01 DIAGNOSIS — Z471 Aftercare following joint replacement surgery: Secondary | ICD-10-CM | POA: Diagnosis not present

## 2014-09-02 ENCOUNTER — Other Ambulatory Visit: Payer: Self-pay | Admitting: Internal Medicine

## 2014-09-02 NOTE — Telephone Encounter (Signed)
Approved: 30 x 0 

## 2014-09-02 NOTE — Telephone Encounter (Signed)
rx called into pharmacy

## 2014-09-02 NOTE — Telephone Encounter (Signed)
08/06/14 

## 2014-09-03 DIAGNOSIS — Z96651 Presence of right artificial knee joint: Secondary | ICD-10-CM | POA: Diagnosis not present

## 2014-09-03 DIAGNOSIS — R2689 Other abnormalities of gait and mobility: Secondary | ICD-10-CM | POA: Diagnosis not present

## 2014-09-03 DIAGNOSIS — Z471 Aftercare following joint replacement surgery: Secondary | ICD-10-CM | POA: Diagnosis not present

## 2014-09-05 DIAGNOSIS — Z96651 Presence of right artificial knee joint: Secondary | ICD-10-CM | POA: Diagnosis not present

## 2014-09-05 DIAGNOSIS — R2689 Other abnormalities of gait and mobility: Secondary | ICD-10-CM | POA: Diagnosis not present

## 2014-09-05 DIAGNOSIS — Z471 Aftercare following joint replacement surgery: Secondary | ICD-10-CM | POA: Diagnosis not present

## 2014-09-08 DIAGNOSIS — M25561 Pain in right knee: Secondary | ICD-10-CM | POA: Diagnosis not present

## 2014-09-08 DIAGNOSIS — Z96651 Presence of right artificial knee joint: Secondary | ICD-10-CM | POA: Diagnosis not present

## 2014-09-11 DIAGNOSIS — Z96651 Presence of right artificial knee joint: Secondary | ICD-10-CM | POA: Diagnosis not present

## 2014-09-11 DIAGNOSIS — M25561 Pain in right knee: Secondary | ICD-10-CM | POA: Diagnosis not present

## 2014-09-12 DIAGNOSIS — Z96651 Presence of right artificial knee joint: Secondary | ICD-10-CM | POA: Diagnosis not present

## 2014-09-12 DIAGNOSIS — M25561 Pain in right knee: Secondary | ICD-10-CM | POA: Diagnosis not present

## 2014-09-15 DIAGNOSIS — M25561 Pain in right knee: Secondary | ICD-10-CM | POA: Diagnosis not present

## 2014-09-15 DIAGNOSIS — Z96651 Presence of right artificial knee joint: Secondary | ICD-10-CM | POA: Diagnosis not present

## 2014-09-18 DIAGNOSIS — M25561 Pain in right knee: Secondary | ICD-10-CM | POA: Diagnosis not present

## 2014-09-18 DIAGNOSIS — Z96651 Presence of right artificial knee joint: Secondary | ICD-10-CM | POA: Diagnosis not present

## 2014-09-25 DIAGNOSIS — Z96651 Presence of right artificial knee joint: Secondary | ICD-10-CM | POA: Diagnosis not present

## 2014-09-25 DIAGNOSIS — M25561 Pain in right knee: Secondary | ICD-10-CM | POA: Diagnosis not present

## 2014-09-30 DIAGNOSIS — Z96651 Presence of right artificial knee joint: Secondary | ICD-10-CM | POA: Diagnosis not present

## 2014-09-30 DIAGNOSIS — M25561 Pain in right knee: Secondary | ICD-10-CM | POA: Diagnosis not present

## 2014-10-07 ENCOUNTER — Encounter: Payer: Self-pay | Admitting: Internal Medicine

## 2014-10-07 ENCOUNTER — Ambulatory Visit (INDEPENDENT_AMBULATORY_CARE_PROVIDER_SITE_OTHER): Payer: Medicare Other | Admitting: Internal Medicine

## 2014-10-07 VITALS — BP 120/80 | HR 71 | Temp 98.3°F | Wt 236.0 lb

## 2014-10-07 DIAGNOSIS — G44209 Tension-type headache, unspecified, not intractable: Secondary | ICD-10-CM | POA: Diagnosis not present

## 2014-10-07 NOTE — Assessment & Plan Note (Signed)
He was worried about a brain tumor---reassured that his symptoms are not neurologic Discussed topical Rx Discussed neutral position for neck

## 2014-10-07 NOTE — Progress Notes (Signed)
Subjective:    Patient ID: [name], male    DOB: November 22, 1940, 74 y.o.   MRN: [id number]  HPI Here due to neck pain  Had his right TKR --- went great. 6 weeks ago and now fairly full flexion  Has had bad headache in posterior right occiput Only occurs in evening like 6PM Starts in neck and moves up back of head Never moves up front Uses peppermint oil on there--it helps Gone in AM Feels it into his left ear--but Dr Tami Ribas found nothing wrong  Has been back to the gym Still doing exercises per the PT for his knee Good ROM of left shoulder now  Takes the diclofenac bid regularly for hand arthritis This seems to help neck  Current Outpatient Prescriptions on File Prior to Visit  Medication Sig Dispense Refill  . acetaminophen (TYLENOL) 500 MG tablet Take 500 mg by mouth every 8 (eight) hours as needed (for pain).     Marland Kitchen aspirin 81 MG tablet Take 81 mg by mouth daily.      . diclofenac (VOLTAREN) 75 MG EC tablet Take 1 tablet (75 mg total) by mouth 2 (two) times daily. 60 tablet 11  . doxazosin (CARDURA) 4 MG tablet Take 1 tablet (4 mg total) by mouth at bedtime. 90 tablet 3  . esomeprazole (NEXIUM) 40 MG capsule Take 1 capsule (40 mg total) by mouth daily before breakfast. 90 capsule 2  . finasteride (PROSCAR) 5 MG tablet Take 1 tablet (5 mg total) by mouth daily. 90 tablet 3  . fluticasone (FLONASE) 50 MCG/ACT nasal spray Place 2 sprays into the nose daily as needed (for nasal congestion at bedtime).      No current facility-administered medications on file prior to visit.    Allergies  Allergen Reactions  . Atorvastatin     REACTION: myalgias  . Simvastatin     REACTION: joint and stomach pain  . Latex Rash    Local area    Past Medical History  Diagnosis Date  . Allergy   . Diverticulitis   . GERD (gastroesophageal reflux disease)   . Hyperlipidemia   . Anxiety   . Hypertension   . Osteoarthritis   . Spinal stenosis   . BPH (benign prostatic  hypertrophy)   . Kidney stones   . Obstructive sleep apnea     Past Surgical History  Procedure Laterality Date  . Tendon repair      Left knee tendon repair--1966  . Vasectomy      Vasectomy/spermatocele/?testicular cyst--1970  . Rotator cuff repair      R rotator cuff repair--1/00  Noemi Chapel)  . Eye muscle surgery      L eye muscle surgery--2/01  . Esophagogastroduodenoscopy      colon--6/04, Barrett's--7/05, Barrett's but no cancer--4/08  . Knee arthroscopy      7/ 09 Arthroscopy left knee--Dr Noemi Chapel  . Joint replacement  1/12    left TKR  . Lipoma excision Left 2010    compressing nerve by spine---Dr Terald Sleeper at [facility]  . Rotator cuff repair Left ~2005 & 11/15    biceps repair also in 2015  . 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 Hypertension Mother   . Cancer Mother     breast cancer  . Coronary artery disease Neg Hx   . Diabetes Neg Hx     History   Social History  . Marital Status: Married  Spouse Name: N/A  . Number of Children: 3  . Years of Education: N/A   Occupational History  . retired    Social History Main Topics  . Smoking status: Never Smoker   . Smokeless tobacco: Never Used  . Alcohol Use: Yes     Comment: occassional wine  . Drug Use: No  . Sexual Activity: Not on file   Other Topics Concern  . Not on file   Social History Narrative   Lives at home with wife         Review of Systems  No arm weakness No fevers--not sick Sleeping fairly well on the trazodone    Objective:   Physical Exam  Constitutional: He appears well-developed and well-nourished. No distress.  Neck:  Some tenderness along left cervical paraspinals Some reduced neck ROM in all spheres No nodes or masses  Neurological:  No focal weakness          Assessment & Plan:

## 2014-10-07 NOTE — Progress Notes (Signed)
Pre visit review using our clinic review tool, if applicable. No additional management support is needed unless otherwise documented below in the visit note. 

## 2014-10-08 DIAGNOSIS — M25561 Pain in right knee: Secondary | ICD-10-CM | POA: Diagnosis not present

## 2014-10-08 DIAGNOSIS — M25512 Pain in left shoulder: Secondary | ICD-10-CM | POA: Diagnosis not present

## 2014-10-08 DIAGNOSIS — Z96651 Presence of right artificial knee joint: Secondary | ICD-10-CM | POA: Diagnosis not present

## 2014-10-08 DIAGNOSIS — Z9889 Other specified postprocedural states: Secondary | ICD-10-CM | POA: Diagnosis not present

## 2014-10-10 DIAGNOSIS — M25561 Pain in right knee: Secondary | ICD-10-CM | POA: Diagnosis not present

## 2014-10-10 DIAGNOSIS — Z96651 Presence of right artificial knee joint: Secondary | ICD-10-CM | POA: Diagnosis not present

## 2014-10-10 DIAGNOSIS — Z96659 Presence of unspecified artificial knee joint: Secondary | ICD-10-CM | POA: Insufficient documentation

## 2014-10-13 DIAGNOSIS — M25512 Pain in left shoulder: Secondary | ICD-10-CM | POA: Diagnosis not present

## 2014-10-13 DIAGNOSIS — M25561 Pain in right knee: Secondary | ICD-10-CM | POA: Diagnosis not present

## 2014-10-13 DIAGNOSIS — Z9889 Other specified postprocedural states: Secondary | ICD-10-CM | POA: Diagnosis not present

## 2014-10-13 DIAGNOSIS — Z96651 Presence of right artificial knee joint: Secondary | ICD-10-CM | POA: Diagnosis not present

## 2014-10-16 DIAGNOSIS — Z9889 Other specified postprocedural states: Secondary | ICD-10-CM | POA: Diagnosis not present

## 2014-10-16 DIAGNOSIS — M25561 Pain in right knee: Secondary | ICD-10-CM | POA: Diagnosis not present

## 2014-10-16 DIAGNOSIS — Z96651 Presence of right artificial knee joint: Secondary | ICD-10-CM | POA: Diagnosis not present

## 2014-10-16 DIAGNOSIS — M25512 Pain in left shoulder: Secondary | ICD-10-CM | POA: Diagnosis not present

## 2014-10-20 DIAGNOSIS — Z9889 Other specified postprocedural states: Secondary | ICD-10-CM | POA: Diagnosis not present

## 2014-10-20 DIAGNOSIS — M25561 Pain in right knee: Secondary | ICD-10-CM | POA: Diagnosis not present

## 2014-10-20 DIAGNOSIS — Z96651 Presence of right artificial knee joint: Secondary | ICD-10-CM | POA: Diagnosis not present

## 2014-10-20 DIAGNOSIS — M25512 Pain in left shoulder: Secondary | ICD-10-CM | POA: Diagnosis not present

## 2014-10-27 DIAGNOSIS — Z96651 Presence of right artificial knee joint: Secondary | ICD-10-CM | POA: Diagnosis not present

## 2014-10-27 DIAGNOSIS — Z9889 Other specified postprocedural states: Secondary | ICD-10-CM | POA: Diagnosis not present

## 2014-10-27 DIAGNOSIS — M25561 Pain in right knee: Secondary | ICD-10-CM | POA: Diagnosis not present

## 2014-10-27 DIAGNOSIS — M25512 Pain in left shoulder: Secondary | ICD-10-CM | POA: Diagnosis not present

## 2014-10-28 ENCOUNTER — Ambulatory Visit (INDEPENDENT_AMBULATORY_CARE_PROVIDER_SITE_OTHER): Payer: Medicare Other | Admitting: Gastroenterology

## 2014-10-28 ENCOUNTER — Ambulatory Visit: Payer: Medicare Other | Admitting: Gastroenterology

## 2014-10-28 ENCOUNTER — Encounter: Payer: Self-pay | Admitting: Gastroenterology

## 2014-10-28 VITALS — BP 128/80 | HR 76 | Ht 67.25 in | Wt 237.5 lb

## 2014-10-28 DIAGNOSIS — Z8719 Personal history of other diseases of the digestive system: Secondary | ICD-10-CM

## 2014-10-28 DIAGNOSIS — Z86010 Personal history of colonic polyps: Secondary | ICD-10-CM

## 2014-10-28 MED ORDER — PEG-KCL-NACL-NASULF-NA ASC-C 100 G PO SOLR: 1.0000 | Freq: Once | ORAL | Status: DC

## 2014-10-28 MED ORDER — ESOMEPRAZOLE MAGNESIUM 40 MG PO CPDR: 40.0000 mg | DELAYED_RELEASE_CAPSULE | Freq: Every day | ORAL | Status: DC

## 2014-10-28 NOTE — Progress Notes (Signed)
HPI: This is a  very pleasant 74 year old man   who was referred to me by Venia Carbon, MD  to evaluate  history of colon polyps, history of Barrett's esophagus  Chief complaint is colon polyps, Barrett's esophagus   I reviewed the follow reports: EGD, Dr. Sharlett Iles, 2008, done for GERD. He noted a hiatal hernia and an irregular Z line. 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 EGD, Dr. Sharlett Iles, July 2011, done for history of Barrett's, found short segment of Barrett's esophagus and a hiatal hernia. 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 Sharlett Iles, July 2011. This was done for screening. He described left-sided diverticulosis and a 5 mm polyp that he removed. Pathology showed that the polyp was a small tubular adenoma without high-grade dysplasia.  Has been on nexium once daily for many years.  On that regimen, no GERD symptoms.  Overall stable weight.  No dysphagia.  Recently, some minor constipation.  Has been on tramadol recently and that can cause it. Review of systems: Pertinent positive and negative review of systems were noted in the above HPI section. Complete review of systems was performed and was otherwise normal.   Past Medical History  Diagnosis Date  . Allergy   . Diverticulitis   . GERD (gastroesophageal reflux disease)   . Hyperlipidemia   . Anxiety   . Hypertension   . Osteoarthritis   . Spinal stenosis   . BPH (benign prostatic hypertrophy)   . Kidney stones   . Obstructive sleep apnea     Past Surgical History  Procedure Laterality Date  . Tendon repair      Left knee tendon repair--1966  . Vasectomy      Vasectomy/spermatocele/?testicular cyst--1970  . Rotator cuff repair      R rotator cuff repair--1/00  Noemi Chapel)  . Eye muscle surgery      L eye muscle surgery--2/01  . Esophagogastroduodenoscopy      colon--6/04, Barrett's--7/05, Barrett's but no  cancer--4/08  . Knee arthroscopy      7/ 09 Arthroscopy left knee--Dr Noemi Chapel  . Joint replacement  1/12    left TKR  . Lipoma excision Left 2010    compressing nerve by spine---[facility]eper [facility]  . Rotator cuff repair Left ~2005 & 11/15    biceps repair also in 2015  . Total knee arthroplasty Right 4/16    Current Outpatient Prescriptions  Medication Sig Dispense Refill  . acetaminophen (TYLENOL) 500 MG tablet Take 500 mg by mouth every 8 (eight) hours as needed (for pain).     Marland Kitchen aspirin 81 MG tablet Take 81 mg by mouth daily.      . diclofenac (VOLTAREN) 75 MG EC tablet Take 1 tablet (75 mg total) by mouth 2 (two) times daily. 60 tablet 11  . doxazosin (CARDURA) 4 MG tablet Take 1 tablet (4 mg total) by mouth at bedtime. 90 tablet 3  . esomeprazole (NEXIUM) 40 MG capsule Take 1 capsule (40 mg total) by mouth daily before breakfast. 90 capsule 2  . finasteride (PROSCAR) 5 MG tablet Take 1 tablet (5 mg total) by mouth daily. 90 tablet 3  . fluticasone (FLONASE) 50 MCG/ACT nasal spray Place 2 sprays into the nose daily as needed (for nasal congestion at bedtime).     . traZODone (DESYREL) 50 MG tablet Take 100 mg by mouth at bedtime as needed.   2   No current facility-administered medications for this visit.  Allergies as of 10/28/2014 - Review Complete 10/28/2014  Allergen Reaction Noted  . Atorvastatin  04/21/2006  . Oxycodone Nausea And Vomiting 10/28/2014  . Simvastatin  04/21/2006  . Latex Rash 04/07/2014    Family History  Problem Relation Age of Onset  . Hypertension Mother   . Breast cancer Mother   . Diabetes Neg Hx   . Colon cancer Neg Hx   . Colon polyps Mother   . Coronary artery disease Mother   . Esophageal cancer Neg Hx   . Gallbladder disease Neg Hx   . Kidney disease Neg Hx     History   Social History  . Marital Status: Married    Spouse Name: N/A  . Number of Children: 3  . Years of Education: N/A   Occupational History  . retired     Social History Main Topics  . Smoking status: Never Smoker   . Smokeless tobacco: Never Used  . Alcohol Use: Yes     Comment: occassional wine  . Drug Use: No  . Sexual Activity: Not on file   Other Topics Concern  . Not on file   Social History Narrative   Lives at home with wife           Physical Exam: Ht 5' 7.25" (1.708 m)  Wt 237 lb 8 oz (107.729 kg)  BMI 36.93 kg/m2 Constitutional: generally well-appearing Psychiatric: alert and oriented x3 Eyes: extraocular movements intact Mouth: oral pharynx moist, no lesions Neck: supple no lymphadenopathy Cardiovascular: heart regular rate and rhythm Lungs: clear to auscultation bilaterally Abdomen: soft, nontender, nondistended, no obvious ascites, no peritoneal signs, normal bowel sounds Extremities: no lower extremity edema bilaterally Skin: no lesions on visible extremities   Assessment and plan: 74 y.o. male with  personal history of adenomatous colon polyps, personal history of Barrett's esophagus  He has no GERD symptoms while taking proton pump inhibitor once daily. He will remain on this regimen. I have called him in a new prescription to his mail order pharmacy. We will proceed with polyp surveillance colonoscopy and Barrett's surveillance upper endoscopy at his soonest convenience. He mentioned some minor constipation may be medication related. #3 side effect of tramadol is constipation. He will consider retrying fiber supplements which have helped him with minor constipation the past.   Owens Loffler, MD [facility] 10/28/2014, 8:14 AM  Cc: Venia Carbon, MD

## 2014-10-28 NOTE — Patient Instructions (Signed)
Consider trying fiber supplements again (citrucel or metamucil). You will be set up for a colonoscopy (for polyp surveillance) You will be set up for an upper endoscopy for Barrett's surveillance. Refills on nexium (90 day script)

## 2014-10-28 NOTE — Addendum Note (Signed)
Addended by: Marzella Schlein on: 10/28/2014 09:04 AM   Modules accepted: Orders

## 2014-10-31 DIAGNOSIS — Z9889 Other specified postprocedural states: Secondary | ICD-10-CM | POA: Diagnosis not present

## 2014-10-31 DIAGNOSIS — M25512 Pain in left shoulder: Secondary | ICD-10-CM | POA: Diagnosis not present

## 2014-10-31 DIAGNOSIS — Z96651 Presence of right artificial knee joint: Secondary | ICD-10-CM | POA: Diagnosis not present

## 2014-10-31 DIAGNOSIS — M25561 Pain in right knee: Secondary | ICD-10-CM | POA: Diagnosis not present

## 2014-11-17 ENCOUNTER — Ambulatory Visit (INDEPENDENT_AMBULATORY_CARE_PROVIDER_SITE_OTHER): Payer: Medicare Other | Admitting: Cardiology

## 2014-11-17 ENCOUNTER — Encounter: Payer: Self-pay | Admitting: Cardiology

## 2014-11-17 VITALS — BP 138/70 | HR 60 | Ht 69.0 in | Wt 239.0 lb

## 2014-11-17 DIAGNOSIS — I10 Essential (primary) hypertension: Secondary | ICD-10-CM

## 2014-11-17 NOTE — Patient Instructions (Signed)
Your physician wants you to follow-up in: 3-4 years with Dr. Percival Spanish. You will receive a reminder letter in the mail two months in advance. If you don't receive a letter, please call our office to schedule the follow-up appointment.

## 2014-11-17 NOTE — Progress Notes (Signed)
HPI The patient presents for evaluation mild aortic insufficiency and a heart murmur. We last saw him because of this prior to knee surgery. The echocardiogram demonstrated minimal aortic sclerosis and possible some mild insufficiency. However, he was not having any clinical symptoms to this and this is again an incidental finding. He did well with his knee surgery. He denies any cardiovascular symptoms. He completed physical therapy. He's going to get back to the gym. The patient denies any new symptoms such as chest discomfort, neck or arm discomfort. There has been no new shortness of breath, PND or orthopnea. There have been no reported palpitations, presyncope or syncope.   Allergies  Allergen Reactions  . Atorvastatin     REACTION: myalgias  . Oxycodone Nausea And Vomiting  . Simvastatin     REACTION: joint and stomach pain  . Latex Rash    Local area  . Statins Other (See Comments)    Joint pain    Current Outpatient Prescriptions  Medication Sig Dispense Refill  . acetaminophen (TYLENOL) 500 MG tablet Take 500 mg by mouth every 8 (eight) hours as needed (for pain).     Marland Kitchen aspirin 81 MG tablet Take 81 mg by mouth daily.      . diclofenac (VOLTAREN) 75 MG EC tablet Take 1 tablet (75 mg total) by mouth 2 (two) times daily. 60 tablet 11  . doxazosin (CARDURA) 4 MG tablet Take 1 tablet (4 mg total) by mouth at bedtime. 90 tablet 3  . esomeprazole (NEXIUM) 40 MG capsule Take 1 capsule (40 mg total) by mouth daily before breakfast. 90 capsule 3  . finasteride (PROSCAR) 5 MG tablet Take 1 tablet (5 mg total) by mouth daily. 90 tablet 3  . fluticasone (FLONASE) 50 MCG/ACT nasal spray Place 2 sprays into the nose daily as needed (for nasal congestion at bedtime).     . peg 3350 powder (MOVIPREP) 100 G SOLR Take 1 kit (200 g total) by mouth once. 1 kit 0  . traZODone (DESYREL) 50 MG tablet Take 100 mg by mouth at bedtime as needed.   2   No current facility-administered medications for  this visit.    Past Medical History  Diagnosis Date  . Allergy   . Diverticulitis   . GERD (gastroesophageal reflux disease)   . Hyperlipidemia   . Anxiety   . Hypertension   . Osteoarthritis   . Spinal stenosis   . BPH (benign prostatic hypertrophy)   . Kidney stones   . Obstructive sleep apnea     Past Surgical History  Procedure Laterality Date  . Tendon repair      Left knee tendon repair--1966  . Vasectomy      Vasectomy/spermatocele/?testicular cyst--1970  . Rotator cuff repair      R rotator cuff repair--1/00  Noemi Chapel)  . Eye muscle surgery      L eye muscle surgery--2/01  . Esophagogastroduodenoscopy      colon--6/04, Barrett's--7/05, Barrett's but no cancer--4/08  . Knee arthroscopy      7/ 09 Arthroscopy left knee--Dr Noemi Chapel  . Joint replacement  1/12    left TKR  . Lipoma excision Left 2010    compressing nerve by spine---[facility]erald [facility]eper at [facility]  . Rotator cuff repair Left ~2005 & 11/15    biceps repair also in 2015  . Total knee arthroplasty Right 4/16     ROS:  As stated in the HPI and negative for all other systems.  PHYSICAL EXAM BP 138/70  mmHg  Pulse 60  Ht '5\' 9"'  (1.753 m)  Wt 239 lb (108.41 kg)  BMI 35.28 kg/m2 GENERAL:  Well appearing NECK:  No jugular venous distention, waveform within normal limits, carotid upstroke brisk and symmetric, no bruits, no thyromegaly LUNGS:  Clear to auscultation bilaterally CHEST:  Unremarkable HEART:  PMI not displaced or sustained,S1 and S2 within normal limits, no S3, no S4, no clicks, no rubs, very brief apical systolic murmur heard at the right upper sternal border, no diastolic murmurs ABD:  Flat, positive bowel sounds normal in frequency in pitch, no bruits, no rebound, no guarding, no midline pulsatile mass, no hepatomegaly, no splenomegaly, obese EXT:  2 plus pulses throughout, no edema, no cyanosis no clubbing SKIN:  No rashes no nodules   ASSESSMENT AND PLAN   HTN:  The blood pressure is at target.  No change in medications is indicated. We will continue with therapeutic lifestyle changes (TLC).  OVERWEIGHT:  The patient understands the need to lose weight with diet and exercise.   He has a diet plan.   HYPERLIPIDEMIA:  He is due to have some lipids followed soon and I will defer to Viviana Simpler, MD  MURMUR:  He is very slight murmur and very minimal sclerotic aortic valve. Some very mild aortic insufficiency and sclerosis no change in therapy or further imaging is indicated.

## 2014-11-18 DIAGNOSIS — G4733 Obstructive sleep apnea (adult) (pediatric): Secondary | ICD-10-CM | POA: Diagnosis not present

## 2014-11-18 DIAGNOSIS — R0981 Nasal congestion: Secondary | ICD-10-CM | POA: Diagnosis not present

## 2014-11-20 DIAGNOSIS — M25561 Pain in right knee: Secondary | ICD-10-CM | POA: Diagnosis not present

## 2014-11-20 DIAGNOSIS — M25512 Pain in left shoulder: Secondary | ICD-10-CM | POA: Diagnosis not present

## 2014-11-20 DIAGNOSIS — Z96651 Presence of right artificial knee joint: Secondary | ICD-10-CM | POA: Diagnosis not present

## 2014-11-20 DIAGNOSIS — Z9889 Other specified postprocedural states: Secondary | ICD-10-CM | POA: Diagnosis not present

## 2014-11-25 ENCOUNTER — Other Ambulatory Visit: Payer: Self-pay | Admitting: Internal Medicine

## 2014-11-25 NOTE — Telephone Encounter (Signed)
Not on active med list, please advise.

## 2014-11-25 NOTE — Telephone Encounter (Deleted)
Pt left v/m; pt going

## 2014-11-26 NOTE — Telephone Encounter (Signed)
Approved: okay #[phone] x 0 

## 2014-11-26 NOTE — Telephone Encounter (Signed)
rx called into pharmacy

## 2014-12-05 DIAGNOSIS — M25561 Pain in right knee: Secondary | ICD-10-CM | POA: Diagnosis not present

## 2014-12-05 DIAGNOSIS — M25512 Pain in left shoulder: Secondary | ICD-10-CM | POA: Diagnosis not present

## 2014-12-05 DIAGNOSIS — Z96651 Presence of right artificial knee joint: Secondary | ICD-10-CM | POA: Diagnosis not present

## 2014-12-05 DIAGNOSIS — Z9889 Other specified postprocedural states: Secondary | ICD-10-CM | POA: Diagnosis not present

## 2014-12-12 DIAGNOSIS — Z9889 Other specified postprocedural states: Secondary | ICD-10-CM | POA: Diagnosis not present

## 2014-12-12 DIAGNOSIS — M25512 Pain in left shoulder: Secondary | ICD-10-CM | POA: Diagnosis not present

## 2014-12-12 DIAGNOSIS — Z96651 Presence of right artificial knee joint: Secondary | ICD-10-CM | POA: Diagnosis not present

## 2014-12-12 DIAGNOSIS — M25561 Pain in right knee: Secondary | ICD-10-CM | POA: Diagnosis not present

## 2014-12-15 ENCOUNTER — Encounter: Payer: Self-pay | Admitting: Internal Medicine

## 2014-12-15 ENCOUNTER — Ambulatory Visit (INDEPENDENT_AMBULATORY_CARE_PROVIDER_SITE_OTHER): Payer: Medicare Other | Admitting: Internal Medicine

## 2014-12-15 VITALS — BP 120/68 | HR 77 | Temp 98.6°F | Ht 69.0 in | Wt 237.0 lb

## 2014-12-15 DIAGNOSIS — N400 Enlarged prostate without lower urinary tract symptoms: Secondary | ICD-10-CM

## 2014-12-15 DIAGNOSIS — Z0000 Encounter for general adult medical examination without abnormal findings: Secondary | ICD-10-CM

## 2014-12-15 DIAGNOSIS — E785 Hyperlipidemia, unspecified: Secondary | ICD-10-CM | POA: Diagnosis not present

## 2014-12-15 DIAGNOSIS — G4733 Obstructive sleep apnea (adult) (pediatric): Secondary | ICD-10-CM

## 2014-12-15 DIAGNOSIS — I10 Essential (primary) hypertension: Secondary | ICD-10-CM

## 2014-12-15 DIAGNOSIS — Z7189 Other specified counseling: Secondary | ICD-10-CM

## 2014-12-15 LAB — T4, FREE: Free T4: 1.12 ng/dL (ref 0.60–1.60)

## 2014-12-15 LAB — COMPREHENSIVE METABOLIC PANEL
ALBUMIN: 4.2 g/dL (ref 3.5–5.2)
ALT: 17 U/L (ref 0–53)
AST: 18 U/L (ref 0–37)
Alkaline Phosphatase: 50 U/L (ref 39–117)
BUN: 14 mg/dL (ref 6–23)
CO2: 29 meq/L (ref 19–32)
CREATININE: 0.91 mg/dL (ref 0.40–1.50)
Calcium: 9.1 mg/dL (ref 8.4–10.5)
Chloride: 104 mEq/L (ref 96–112)
GFR: 86.69 mL/min (ref >60.00)
Glucose, Bld: 80 mg/dL (ref 70–99)
POTASSIUM: 3.8 meq/L (ref 3.5–5.1)
SODIUM: 139 meq/L (ref 135–145)
TOTAL PROTEIN: 6.5 g/dL (ref 6.0–8.3)
Total Bilirubin: 0.7 mg/dL (ref 0.2–1.2)

## 2014-12-15 LAB — CBC WITH DIFFERENTIAL/PLATELET
BASOS ABS: 0 10*3/uL (ref 0.0–0.1)
Basophils Relative: 0.6 % (ref 0.0–3.0)
EOS ABS: 0.5 10*3/uL (ref 0.0–0.7)
EOS PCT: 7.6 % — AB (ref 0.0–5.0)
HCT: 39.8 % (ref 39.0–52.0)
Hemoglobin: 13.3 g/dL (ref 13.0–17.0)
LYMPHS PCT: 24.5 % (ref 12.0–46.0)
Lymphs Abs: 1.5 10*3/uL (ref 0.7–4.0)
MCHC: 33.5 g/dL (ref 30.0–36.0)
MCV: 84.8 fl (ref 78.0–100.0)
MONOS PCT: 9 % (ref 3.0–12.0)
Monocytes Absolute: 0.6 10*3/uL (ref 0.1–1.0)
NEUTROS ABS: 3.6 10*3/uL (ref 1.4–7.7)
NEUTROS PCT: 58.3 % (ref 43.0–77.0)
Platelets: 206 10*3/uL (ref 150.0–400.0)
RBC: 4.7 Mil/uL (ref 4.22–5.81)
RDW: 14.2 % (ref 11.5–15.5)
WBC: 6.3 10*3/uL (ref 4.0–10.5)

## 2014-12-15 LAB — LIPID PANEL
CHOL/HDL RATIO: 6
Cholesterol: 218 mg/dL — ABNORMAL HIGH (ref 0–200)
HDL: 37.4 mg/dL — ABNORMAL LOW (ref >39.00)
LDL Cholesterol: 154 mg/dL — ABNORMAL HIGH (ref 0–99)
NONHDL: 180.69
TRIGLYCERIDES: 131 mg/dL (ref 0.0–149.0)
VLDL: 26.2 mg/dL (ref 0.0–40.0)

## 2014-12-15 NOTE — Assessment & Plan Note (Signed)
BP Readings from Last 3 Encounters:  12/15/14 120/68  11/17/14 138/70  10/28/14 128/80   Good control No change Due for labs

## 2014-12-15 NOTE — Assessment & Plan Note (Addendum)
Mild symptoms Continues on the finasteride

## 2014-12-15 NOTE — Progress Notes (Signed)
Pre visit review using our clinic review tool, if applicable. No additional management support is needed unless otherwise documented below in the visit note. 

## 2014-12-15 NOTE — Assessment & Plan Note (Signed)
Does okay with the CPAP

## 2014-12-15 NOTE — Assessment & Plan Note (Signed)
See social history 

## 2014-12-15 NOTE — Assessment & Plan Note (Signed)
I have personally reviewed the Medicare Annual Wellness questionnaire and have noted 1. The patient's medical and social history 2. Their use of alcohol, tobacco or illicit drugs 3. Their current medications and supplements 4. The patient's functional ability including ADL's, fall risks, home safety risks and hearing or visual             impairment. 5. Diet and physical activities 6. Evidence for depression or mood disorders  The patients weight, height, BMI and visual acuity have been recorded in the chart I have made referrals, counseling and provided education to the patient based review of the above and I have provided the pt with a written personalized care plan for preventive services.  I have provided you with a copy of your personalized plan for preventive services. Please take the time to review along with your updated medication list.  He prefers no tetanus booster Having colonoscopy within the next week Will reconsider PSA next year

## 2014-12-15 NOTE — Progress Notes (Signed)
Subjective:    Patient ID: [name], male    DOB: 06-Oct-1940, 74 y.o.   MRN: [id number]  HPI Here for Medicare wellness visit and follow up on chronic medical conditions Reviewed form and advanced directives Reviewed other doctors No alcohol or tobacco Vision and hearing are okay No falls No depression or anhedonia Independent with all instrumental ADLs No cognitive problems  Has recovered well from recent TKR Headache is better-- still gets "crick" in neck though at times Had been getting PT for the knee Now feels he wants PT for recurrent sciatica pain Hasn't been doing core strengthening --though has some exercises to do Hopes to be more regular with exercise now that knee is replaced  No chest pain Does get some DOE--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 acetaminophen (TYLENOL) 500 MG tablet Take 500 mg by mouth every 8 (eight) hours as needed (for pain).     Marland Kitchen aspirin 81 MG tablet Take 81 mg by mouth daily.      . diclofenac (VOLTAREN) 75 MG EC tablet Take 1 tablet (75 mg total) by mouth 2 (two) times daily. 60 tablet 11  . doxazosin (CARDURA) 4 MG tablet Take 1 tablet (4 mg total) by mouth at bedtime. 90 tablet 3  . esomeprazole (NEXIUM) 40 MG capsule Take 1 capsule (40 mg total) by mouth daily before breakfast. 90 capsule 3  . finasteride (PROSCAR) 5 MG tablet Take 1 tablet (5 mg total) by mouth daily. 90 tablet 3  . fluticasone (FLONASE) 50 MCG/ACT nasal spray Place 2 sprays into the nose daily as needed (for nasal congestion at bedtime).     . LORazepam (ATIVAN) 0.5 MG tablet TAKE ONE TABLET AT BEDTIME AS NEEDED 30 tablet 0  . peg 3350 powder (MOVIPREP) 100 G  SOLR Take 1 kit (200 g total) by mouth once. 1 kit 0  . traZODone (DESYREL) 50 MG tablet TAKE ONE TO TWO TABLETS EACH NIGHT AT BEDTIME 60 tablet 0   No current facility-administered medications on file prior to visit.    Allergies  Allergen Reactions  . Atorvastatin     REACTION: myalgias  . Oxycodone Nausea And Vomiting  . Simvastatin     REACTION: joint and stomach pain  . Latex Rash    Local area  . Statins Other (See Comments)    Joint pain    Past Medical History  Diagnosis Date  . Allergy   . Diverticulitis   . GERD (gastroesophageal reflux disease)   . Hyperlipidemia   . Anxiety   . Hypertension   . Osteoarthritis   . Spinal stenosis   . BPH (benign prostatic hypertrophy)   . Kidney stones   . Obstructive sleep apnea     CPAP    Past Surgical History  Procedure Laterality Date  . Tendon repair      Left knee tendon repair--1966  . Vasectomy      Vasectomy/spermatocele/?testicular cyst--1970  . Rotator cuff repair      R rotator cuff repair--1/00  Noemi Chapel)  . Eye muscle surgery      L eye muscle surgery--2/01  . Esophagogastroduodenoscopy      colon--6/04, Barrett's--7/05, Barrett's but  no cancer--4/08  . Knee arthroscopy      7/ 09 Arthroscopy left knee--Dr Noemi Chapel  . Joint replacement  1/12    left TKR  . Lipoma excision Left 2010    compressing nerve by spine---Dr Terald Sleeper at [facility]  . Rotator cuff repair Left ~2005 & 11/15    biceps repair also in 2015  . 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 Hypertension Mother   . Breast cancer Mother   . Diabetes Neg Hx   . Colon cancer Neg Hx   . Colon polyps Mother   . Coronary artery disease Mother   . Esophageal cancer Neg Hx   . Gallbladder disease Neg Hx   . Kidney disease Neg Hx     History   Social History  . Marital Status: Married    Spouse Name: N/A  . Number of Children: 3  . Years of Education: N/A   Occupational History  . retired      Social History Main Topics  . Smoking status: Never Smoker   . Smokeless tobacco: Never Used  . Alcohol Use: Yes     Comment: occassional wine  . Drug Use: No  . Sexual Activity: Not on file   Other Topics Concern  . Not on file   Social History Narrative   Lives at home with wife      Has living will   Wife is health care POA   Would accept resuscitation attempts   No tube feeds if cognitively unaware            Review of Systems Sleeps okay with CPAP--now on the 171m of trazodone Appetite is fine Weight is fairly stable Wears seat belt Teeth are okay---keeps up with dentist    Objective:   Physical Exam  Constitutional: He is oriented to person, place, and time. He appears well-developed and well-nourished. No distress.  HENT:  Mouth/Throat: Oropharynx is clear and moist. No oropharyngeal exudate.  Neck: Normal range of motion. Neck supple. No thyromegaly present.  Cardiovascular: Normal rate, regular rhythm and intact distal pulses.  Exam reveals no gallop.   Soft murmur--mostly aortic  Pulmonary/Chest: Effort normal and breath sounds normal. No respiratory distress. He has no wheezes. He has no rales.  Abdominal: Soft. There is no tenderness.  Musculoskeletal: He exhibits no edema or tenderness.  Lymphadenopathy:    He has no cervical adenopathy.  Neurological: He is alert and oriented to person, place, and time.  President-- "Obama, CAntoine ...then Bush" [id number] Recall 3/3  Skin: No rash noted. No erythema.  Psychiatric: He has a normal mood and affect. His behavior is normal.          Assessment & Plan:

## 2014-12-15 NOTE — Assessment & Plan Note (Signed)
Intolerant of multiple statins so will hold off on meds still He is working on low carb diet Hopes to exercise more

## 2014-12-16 ENCOUNTER — Encounter: Payer: Self-pay | Admitting: Gastroenterology

## 2014-12-16 ENCOUNTER — Telehealth: Payer: Self-pay | Admitting: Gastroenterology

## 2014-12-16 ENCOUNTER — Ambulatory Visit (AMBULATORY_SURGERY_CENTER): Payer: Medicare Other | Admitting: Gastroenterology

## 2014-12-16 ENCOUNTER — Encounter: Payer: Self-pay | Admitting: *Deleted

## 2014-12-16 VITALS — BP 118/73 | HR 72 | Temp 97.0°F | Resp 19 | Ht 67.25 in | Wt 237.0 lb

## 2014-12-16 DIAGNOSIS — Z86010 Personal history of colonic polyps: Secondary | ICD-10-CM

## 2014-12-16 DIAGNOSIS — Z8719 Personal history of other diseases of the digestive system: Secondary | ICD-10-CM

## 2014-12-16 DIAGNOSIS — D125 Benign neoplasm of sigmoid colon: Secondary | ICD-10-CM

## 2014-12-16 DIAGNOSIS — K2270 Barrett's esophagus without dysplasia: Secondary | ICD-10-CM | POA: Diagnosis not present

## 2014-12-16 DIAGNOSIS — I10 Essential (primary) hypertension: Secondary | ICD-10-CM | POA: Diagnosis not present

## 2014-12-16 MED ORDER — SODIUM CHLORIDE 0.9 % IV SOLN: 500.0000 mL | INTRAVENOUS | Status: DC

## 2014-12-16 NOTE — Op Note (Signed)
[place]  [address] [place], [id number]   COLONOSCOPY PROCEDURE REPORT  PATIENT: [name], [name]  MR#: [id number] BIRTHDATE: [birth year] , 74  yrs. old GENDER: male ENDOSCOPIST: Milus Banister, MD PROCEDURE DATE:  12/16/2014 PROCEDURE:   Colonoscopy, surveillance and Colonoscopy with snare polypectomy First Screening Colonoscopy - Avg.  risk and is 50 yrs.  old or older - No.  Prior Negative Screening - Now for repeat screening. N/A  History of Adenoma - Now for follow-up colonoscopy & has been > or = to 3 yrs.  Yes hx of adenoma.  Has been 3 or more years since last colonoscopy.  Polyps removed today? Yes ASA CLASS:   Class II INDICATIONS:Surveillance due to prior colonic neoplasia and Colonoscopy, Dr.  Sharlett Iles, July 2011.  This was done for screening.  He described left-sided diverticulosis and a 5 mm polyp that he removed.  Pathology showed that the polyp was a small tubular adenoma without high-grade dysplasia. MEDICATIONS: Monitored anesthesia care and Propofol 175 mg IV  DESCRIPTION OF PROCEDURE:   After the risks benefits and alternatives of the procedure were thoroughly explained, informed consent was obtained.  The digital rectal exam revealed no abnormalities of the rectum.   The LB [id number]  endoscope was introduced through the anus and advanced to the cecum, which was identified by both the appendix and ileocecal valve. No adverse events experienced.   The quality of the prep was excellent.  The instrument was then slowly withdrawn as the colon was fully examined. Estimated blood loss is zero unless otherwise noted in this procedure report.   COLON FINDINGS: A sessile polyp measuring 3 mm in size was found in the sigmoid colon.  A polypectomy was performed with a cold snare. The resection was complete, the polyp tissue was completely retrieved and sent to histology.   There was mild diverticulosis noted in the left colon.   The  examination was otherwise normal. Retroflexed views revealed no abnormalities. The time to cecum = 1.1 Withdrawal time = 6.3   The scope was withdrawn and the procedure completed. COMPLICATIONS: There were no immediate complications.  ENDOSCOPIC IMPRESSION: 1.   Sessile polyp was found in the sigmoid colon; polypectomy was performed with a cold snare 2.   Mild diverticulosis was noted in the left colon 3.   The examination was otherwise normal  RECOMMENDATIONS: If the polyp(s) removed today are proven to be adenomatous (pre-cancerous) polyps, you will need a repeat colonoscopy in 5 years.  You will receive a letter within 1-2 weeks with the results of your biopsy as well as final recommendations.  Please call my office if you have not received a letter after 3 weeks.  eSigned:  Milus Banister, MD 12/16/2014 3:03 PM   cc:   PATIENT NAME:  [name], [name] MR#: [id number]

## 2014-12-16 NOTE — Patient Instructions (Signed)
YOU HAD AN ENDOSCOPIC PROCEDURE TODAY AT THE  ENDOSCOPY CENTER:   Refer to the procedure report that was given to you for any specific questions about what was found during the examination.  If the procedure report does not answer your questions, please call your gastroenterologist to clarify.  If you requested that your care partner not be given the details of your procedure findings, then the procedure report has been included in a sealed envelope for you to review at your convenience later.  YOU SHOULD EXPECT: Some feelings of bloating in the abdomen. Passage of more gas than usual.  Walking can help get rid of the air that was put into your GI tract during the procedure and reduce the bloating. If you had a lower endoscopy (such as a colonoscopy or flexible sigmoidoscopy) you may notice spotting of blood in your stool or on the toilet paper. If you underwent a bowel prep for your procedure, you may not have a normal bowel movement for a few days.  Please Note:  You might notice some irritation and congestion in your nose or some drainage.  This is from the oxygen used during your procedure.  There is no need for concern and it should clear up in a day or so.  SYMPTOMS TO REPORT IMMEDIATELY:   Following lower endoscopy (colonoscopy or flexible sigmoidoscopy):  Excessive amounts of blood in the stool  Significant tenderness or worsening of abdominal pains  Swelling of the abdomen that is new, acute  Fever of 100F or higher   Following upper endoscopy (EGD)  Vomiting of blood or coffee ground material  New chest pain or pain under the shoulder blades  Painful or persistently difficult swallowing  New shortness of breath  Fever of 100F or higher  Black, tarry-looking stools  For urgent or emergent issues, a gastroenterologist can be reached at any hour by calling [phone].   DIET: Your first meal following the procedure should be a small meal and then it is ok to progress to  your normal diet. Heavy or fried foods are harder to digest and may make you feel nauseous or bloated.  Likewise, meals heavy in dairy and vegetables can increase bloating.  Drink plenty of fluids but you should avoid alcoholic beverages for 24 hours.  ACTIVITY:  You should plan to take it easy for the rest of today and you should NOT DRIVE or use heavy machinery until tomorrow (because of the sedation medicines used during the test).    FOLLOW UP: Our staff will call the number listed on your records the next business day following your procedure to check on you and address any questions or concerns that you may have regarding the information given to you following your procedure. If we do not reach you, we will leave a message.  However, if you are feeling well and you are not experiencing any problems, there is no need to return our call.  We will assume that you have returned to your regular daily activities without incident.  If any biopsies were taken you will be contacted by phone or by letter within the next 1-3 weeks.  Please call us at [phone] if you have not heard about the biopsies in 3 weeks.    SIGNATURES/CONFIDENTIALITY: You and/or your care partner have signed paperwork which will be entered into your electronic medical record.  These signatures attest to the fact that that the information above on your After Visit Summary has been reviewed   and is understood.  Full responsibility of the confidentiality of this discharge information lies with you and/or your care-partner. 

## 2014-12-16 NOTE — Progress Notes (Signed)
Transferred to recovery room. A/O x3, pleased with MAC.  VSS.  Report to Karen, RN. 

## 2014-12-16 NOTE — Op Note (Signed)
[place]  [address]. [place], [id number]   ENDOSCOPY PROCEDURE REPORT  PATIENT: [name], [name]  MR#: [id number] BIRTHDATE: 22-Aug-1940 , 33  yrs. old GENDER: male ENDOSCOPIST: Milus Banister, MD PROCEDURE DATE:  12/16/2014 PROCEDURE:  EGD w/ biopsy ASA CLASS:     Class II INDICATIONS:  EGD, Dr.  Sharlett Iles, 2008, done for GERD.  He noted a hiatal hernia and an irregular Z line.  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  EGD, Dr. Sharlett Iles, July 2011, done for history of Barrett's, found short segment of Barrett's esophagus and a hiatal hernia.  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 MEDICATIONS: Monitored anesthesia care and Propofol 75 mg IV TOPICAL ANESTHETIC: none  DESCRIPTION OF PROCEDURE: After the risks benefits and alternatives of the procedure were thoroughly explained, informed consent was obtained.  The LB [id number] endoscope was introduced through the mouth and advanced to the second portion of the duodenum , Without limitations.  The instrument was slowly withdrawn as the mucosa was fully examined.  There was a slightly irregular but non-nodular Z line.  This was biopsied and sent to pathology.  There was a 2cm hiatal hernia. The examination was otherwise normal.  Retroflexed views revealed no abnormalities.     The scope was then withdrawn from the patient and the procedure completed.  COMPLICATIONS: There were no immediate complications.  ENDOSCOPIC IMPRESSION: There was a slightly irregular but non-nodular Z line.  This was biopsied and sent to pathology.  There was a 2cm hiatal hernia. The examination was otherwise normal  RECOMMENDATIONS: Await biopsy results  eSigned:  Milus Banister, MD 12/16/2014 3:11 PM    CC: Viviana Simpler, MD

## 2014-12-16 NOTE — Progress Notes (Signed)
Called to room to assist during endoscopic procedure.  Patient ID and intended procedure confirmed with present staff. Received instructions for my participation in the procedure from the performing physician.  

## 2014-12-16 NOTE — Telephone Encounter (Signed)
Pt. Stated that he  wanted to make sure that he was ok to have this procedure, that he always fail the EKG when he goes in for any procedure,stated that he had an ECHO in April that he always get clearence before having any procedure. Dr. Ardis Hughs made aware of pt. Concerns and EF results. Dr. Ardis Hughs stated that it is okay for pt. To have procedure. Pt. Notified that it was okay to have procedure.

## 2014-12-17 ENCOUNTER — Telehealth: Payer: Self-pay | Admitting: *Deleted

## 2014-12-17 NOTE — Telephone Encounter (Signed)
  Follow up Call-  Call back number 12/16/2014  Post procedure Call Back phone  # [phone]  Permission to leave phone message Yes     Patient questions:  Do you have a fever, pain , or abdominal swelling? No. Pain Score  0 *  Have you tolerated food without any problems? Yes.    Have you been able to return to your normal activities? Yes.    Do you have any questions about your discharge instructions: Diet   No. Medications  No. Follow up visit  No.  Do you have questions or concerns about your Care? No.  Actions: * If pain score is 4 or above: No action needed, pain <4.

## 2014-12-26 ENCOUNTER — Encounter: Payer: Self-pay | Admitting: Gastroenterology

## 2014-12-26 DIAGNOSIS — M25561 Pain in right knee: Secondary | ICD-10-CM | POA: Diagnosis not present

## 2014-12-26 DIAGNOSIS — Z96651 Presence of right artificial knee joint: Secondary | ICD-10-CM | POA: Diagnosis not present

## 2014-12-26 DIAGNOSIS — Z9889 Other specified postprocedural states: Secondary | ICD-10-CM | POA: Diagnosis not present

## 2014-12-26 DIAGNOSIS — M25512 Pain in left shoulder: Secondary | ICD-10-CM | POA: Diagnosis not present

## 2015-01-05 DIAGNOSIS — Z9889 Other specified postprocedural states: Secondary | ICD-10-CM | POA: Diagnosis not present

## 2015-01-05 DIAGNOSIS — Z96651 Presence of right artificial knee joint: Secondary | ICD-10-CM | POA: Diagnosis not present

## 2015-01-05 DIAGNOSIS — M25512 Pain in left shoulder: Secondary | ICD-10-CM | POA: Diagnosis not present

## 2015-01-05 DIAGNOSIS — M25561 Pain in right knee: Secondary | ICD-10-CM | POA: Diagnosis not present

## 2015-01-13 DIAGNOSIS — H2513 Age-related nuclear cataract, bilateral: Secondary | ICD-10-CM | POA: Diagnosis not present

## 2015-01-14 DIAGNOSIS — B351 Tinea unguium: Secondary | ICD-10-CM | POA: Diagnosis not present

## 2015-01-14 DIAGNOSIS — M79674 Pain in right toe(s): Secondary | ICD-10-CM | POA: Diagnosis not present

## 2015-01-14 DIAGNOSIS — M79675 Pain in left toe(s): Secondary | ICD-10-CM | POA: Diagnosis not present

## 2015-02-25 DIAGNOSIS — G4733 Obstructive sleep apnea (adult) (pediatric): Secondary | ICD-10-CM | POA: Diagnosis not present

## 2015-02-25 DIAGNOSIS — R0981 Nasal congestion: Secondary | ICD-10-CM | POA: Diagnosis not present

## 2015-03-02 ENCOUNTER — Other Ambulatory Visit: Payer: Self-pay | Admitting: Internal Medicine

## 2015-03-02 NOTE — Telephone Encounter (Signed)
Ok to fill 

## 2015-03-03 ENCOUNTER — Ambulatory Visit (INDEPENDENT_AMBULATORY_CARE_PROVIDER_SITE_OTHER): Payer: Medicare Other | Admitting: Internal Medicine

## 2015-03-03 ENCOUNTER — Other Ambulatory Visit
Admission: RE | Admit: 2015-03-03 | Discharge: 2015-03-03 | Disposition: A | Discharge status: Home / Self Care | Payer: Medicare Other | Source: Ambulatory Visit | Attending: Internal Medicine | Admitting: Internal Medicine

## 2015-03-03 ENCOUNTER — Encounter: Payer: Self-pay | Admitting: Internal Medicine

## 2015-03-03 VITALS — BP 130/70 | HR 57 | Ht 69.0 in | Wt 243.0 lb

## 2015-03-03 DIAGNOSIS — R0602 Shortness of breath: Secondary | ICD-10-CM

## 2015-03-03 DIAGNOSIS — J986 Disorders of diaphragm: Secondary | ICD-10-CM | POA: Diagnosis not present

## 2015-03-03 LAB — BASIC METABOLIC PANEL
Anion gap: 6 (ref 5–15)
BUN: 23 mg/dL — ABNORMAL HIGH (ref 6–20)
CALCIUM: 9.2 mg/dL (ref 8.9–10.3)
CO2: 28 mmol/L (ref 22–32)
CREATININE: 0.94 mg/dL (ref 0.61–1.24)
Chloride: 106 mmol/L (ref 101–111)
GFR calc Af Amer: >60 mL/min (ref >60)
GFR calc non Af Amer: >60 mL/min (ref >60)
GLUCOSE: 93 mg/dL (ref 65–99)
Potassium: 4 mmol/L (ref 3.5–5.1)
SODIUM: 140 mmol/L (ref 135–145)

## 2015-03-03 MED ORDER — ALBUTEROL SULFATE HFA 108 (90 BASE) MCG/ACT IN AERS: 2.0000 | INHALATION_SPRAY | Freq: Four times a day (QID) | RESPIRATORY_TRACT | Status: DC | PRN

## 2015-03-03 NOTE — Telephone Encounter (Signed)
Approved: 1 year Rx---confirm he is still taking it

## 2015-03-03 NOTE — Progress Notes (Signed)
[facility] Consultation      Date: 03/03/2015,   MRN# [id number] [name] 1941-03-14 Code Status:  Hosp day:'@LENGTHOFSTAYDAYS' @ Referring MD: '@[email or web address]' @     PCP:      AdmissionWeight: 243 lb (110.224 kg)                 CurrentWeight: 243 lb (110.224 kg) [name] is a 74 y.o. old male seen in consultation for chronic SOB     CHIEF COMPLAINT:   Chronic SOB   HISTORY OF PRESENT ILLNESS  74 yo white male seen today for chronic SOB, patient with a dx of chronic RT sided elevated hemidiaphragm  CXR reviewed from 2012 with patient. Patient has been SOB for a very long time.   Patient is non smoker Patient had no previous h/o CVA or Trauma Patient has increasingly becoming more SOB with exertion,  patient feeling fatigued, denies feversm chills, NVD.  Patient sitting up comfortably, NAD Patient has no signs of infection at this time, patient has no acute resp issues.more chronic in nature  Patient has been seeing ENT for sinus issues and may undergo surgery. Patient has been diagnosed with OSA on BiPAP for the past year It is unclear what patients sleep status is      PAST MEDICAL HISTORY   Past Medical History  Diagnosis Date  . Allergy   . Diverticulitis   . GERD (gastroesophageal reflux disease)   . Hyperlipidemia   . Anxiety   . Hypertension   . Osteoarthritis   . Spinal stenosis   . BPH (benign prostatic hypertrophy)   . Kidney stones   . Obstructive sleep apnea     CPAP  . Heart murmur   . Sleep apnea     cpap nightly  . Osteoporosis      SURGICAL HISTORY   Past Surgical History  Procedure Laterality Date  . Tendon repair      Left knee tendon repair--1966  . Vasectomy      Vasectomy/spermatocele/?testicular cyst--1970  . Rotator cuff repair      R rotator cuff repair--1/00  Noemi Chapel)  . Eye muscle surgery      L eye muscle surgery--2/01  . Esophagogastroduodenoscopy      colon--6/04, Barrett's--7/05,  Barrett's but no cancer--4/08  . Knee arthroscopy      7/ 09 Arthroscopy left knee--Dr Noemi Chapel  . Joint replacement  1/12    left TKR  . Lipoma excision Left 2010    compressing nerve by spine---Dr Terald Sleeper at [facility]  . Rotator cuff repair Left ~2005 & 11/15    biceps repair also in 2015  . 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 Hypertension Mother   . Breast cancer Mother   . Diabetes Neg Hx   . Colon cancer Neg Hx   . Colon polyps Mother   . Coronary artery disease Mother   . Esophageal cancer Neg Hx   . Gallbladder disease Neg Hx   . Kidney disease Neg Hx      SOCIAL HISTORY   Social History  Substance Use Topics  . Smoking status: Never Smoker   . Smokeless tobacco: Never Used  . Alcohol Use: Yes     Comment: occassional wine     MEDICATIONS    Current Medication:  Current outpatient prescriptions:  .  acetaminophen (TYLENOL) 500 MG tablet, Take 500 mg by mouth  every 8 (eight) hours as needed (for pain). , Disp: , Rfl:  .  aspirin 81 MG tablet, Take 81 mg by mouth daily.  , Disp: , Rfl:  .  diclofenac (VOLTAREN) 75 MG EC tablet, Take 1 tablet (75 mg total) by mouth 2 (two) times daily., Disp: 60 tablet, Rfl: 11 .  doxazosin (CARDURA) 4 MG tablet, Take 1 tablet (4 mg total) by mouth at bedtime., Disp: 90 tablet, Rfl: 3 .  esomeprazole (NEXIUM) 40 MG capsule, Take 1 capsule (40 mg total) by mouth daily before breakfast., Disp: 90 capsule, Rfl: 3 .  fexofenadine (ALLEGRA) 180 MG tablet, Take 180 mg by mouth as needed for allergies or rhinitis., Disp: , Rfl:  .  finasteride (PROSCAR) 5 MG tablet, Take 1 tablet (5 mg total) by mouth daily., Disp: 90 tablet, Rfl: 3 .  fluticasone (FLONASE) 50 MCG/ACT nasal spray, Place 2 sprays into the nose daily as needed (for nasal congestion at bedtime). , Disp: , Rfl:  .  LORazepam (ATIVAN) 0.5 MG tablet, TAKE ONE TABLET AT BEDTIME AS NEEDED, Disp: 30 tablet, Rfl:  0 .  peg 3350 powder (MOVIPREP) 100 G SOLR, Take 1 kit (200 g total) by mouth once., Disp: 1 kit, Rfl: 0 .  traZODone (DESYREL) 50 MG tablet, TAKE 1 BY MOUTH AT BEDTIME AS NEEDED, Disp: 90 tablet, Rfl: 3    ALLERGIES   Atorvastatin; Oxycodone; Simvastatin; Latex; and Statins     REVIEW OF SYSTEMS   Review of Systems  Constitutional: Positive for malaise/fatigue. Negative for fever, chills, weight loss and diaphoresis.  Eyes: Negative.   Respiratory: Positive for shortness of breath. Negative for cough, hemoptysis, sputum production and wheezing.   Cardiovascular: Positive for leg swelling. Negative for chest pain, palpitations and orthopnea.  Gastrointestinal: Negative.  Negative for heartburn, nausea, vomiting, abdominal pain, diarrhea and constipation.  Genitourinary: Negative.   Musculoskeletal: Negative.   Skin: Negative for rash.  Neurological: Negative.  Negative for dizziness and headaches.  Psychiatric/Behavioral: Negative.      VS: BP 130/70 mmHg  Pulse 57  Ht '5\' 9"'  (1.753 m)  Wt 243 lb (110.224 kg)  BMI 35.87 kg/m2  SpO2 97%     PHYSICAL EXAM  Physical Exam  Constitutional: He is oriented to person, place, and time. He appears well-developed and well-nourished. No distress.  HENT:  Head: Normocephalic and atraumatic.  Mouth/Throat: No oropharyngeal exudate.  Eyes: EOM are normal. Pupils are equal, round, and reactive to light.  Neck: Normal range of motion. Neck supple.  Cardiovascular: Normal rate, regular rhythm and normal heart sounds.   No murmur heard. Pulmonary/Chest: No respiratory distress. He has no wheezes.  Decreased BS RT base  Abdominal: Soft. Bowel sounds are normal.  Musculoskeletal: Normal range of motion.  Neurological: He is alert and oriented to person, place, and time.  Skin: Skin is warm. He is not diaphoretic.  Psychiatric: He has a normal mood and affect.     ASSESSMENT/PLAN   74 yo white male with chronic SOB with DOE with  chronic elevated hemidiaphragm etiology is unknown  1.albuterol as needed-will assess if this helps or not 2.obtain PFT's with 6MWT 3.CT chest to assess lung fields 4.overnight pulse oximetry  Will consider Pulm rehab afetr work up complete  Follow up after results  I have personally obtained a history, examined the patient, evaluated laboratory and independently reviewed imaging results, formulated the assessment and plan and placed orders. The Patient requires high complexity decision making for assessment and  support, frequent evaluation and titration of therapies, application of advanced monitoring technologies and extensive interpretation of multiple databases.  Patient/Family are satisfied with Plan of action and management. All questions answered  [name], M.D.  [facility]  Medical Director  [facility] Director [facility] Cardio-Pulmonary Department

## 2015-03-03 NOTE — Telephone Encounter (Signed)
rx sent to pharmacy by e-script  

## 2015-03-03 NOTE — Patient Instructions (Signed)

## 2015-03-06 ENCOUNTER — Ambulatory Visit
Admission: RE | Admit: 2015-03-06 | Discharge: 2015-03-06 | Disposition: A | Discharge status: Home / Self Care | Payer: Medicare Other | Source: Ambulatory Visit | Attending: Internal Medicine | Admitting: Internal Medicine

## 2015-03-06 DIAGNOSIS — R0602 Shortness of breath: Secondary | ICD-10-CM | POA: Diagnosis not present

## 2015-03-06 DIAGNOSIS — I2510 Atherosclerotic heart disease of native coronary artery without angina pectoris: Secondary | ICD-10-CM | POA: Diagnosis not present

## 2015-03-06 DIAGNOSIS — J986 Disorders of diaphragm: Secondary | ICD-10-CM | POA: Insufficient documentation

## 2015-03-06 MED ORDER — IOHEXOL 300 MG/ML  SOLN: 75.0000 mL | Freq: Once | INTRAMUSCULAR | Status: DC | PRN

## 2015-03-12 ENCOUNTER — Ambulatory Visit (INDEPENDENT_AMBULATORY_CARE_PROVIDER_SITE_OTHER): Payer: Medicare Other | Admitting: Internal Medicine

## 2015-03-12 ENCOUNTER — Encounter: Payer: Self-pay | Admitting: Internal Medicine

## 2015-03-12 VITALS — BP 122/70 | HR 67 | Temp 98.3°F | Wt 239.0 lb

## 2015-03-12 DIAGNOSIS — Z23 Encounter for immunization: Secondary | ICD-10-CM

## 2015-03-12 DIAGNOSIS — L719 Rosacea, unspecified: Secondary | ICD-10-CM

## 2015-03-12 MED ORDER — METRONIDAZOLE 0.75 % EX GEL: 1.0000 "application " | Freq: Two times a day (BID) | CUTANEOUS | Status: DC

## 2015-03-12 NOTE — Progress Notes (Signed)
Pre visit review using our clinic review tool, if applicable. No additional management support is needed unless otherwise documented below in the visit note. 

## 2015-03-12 NOTE — Assessment & Plan Note (Signed)
Mild and has responded to topical Rx in the past Will prescribe metronidazole gel for him If doesn't respond--would try brief course of oral doxy

## 2015-03-12 NOTE — Addendum Note (Signed)
Addended by: Lurlean Nanny on: 03/12/2015 04:58 PM   Modules accepted: Orders

## 2015-03-12 NOTE — Progress Notes (Signed)
Subjective:    Patient ID: [name], male    DOB: 05-16-1940, 74 y.o.   MRN: [id number]  HPI Here due to problems with his rosacea  Left upper cheek and tip of nose have flared up recently Was using a gel in past--kept things under control Doesn't clearly seem to be photosensitive CPAP may aggravate it  Current Outpatient Prescriptions on File Prior to Visit  Medication Sig Dispense Refill  . acetaminophen (TYLENOL) 500 MG tablet Take 500 mg by mouth every 8 (eight) hours as needed (for pain).     Marland Kitchen albuterol (PROVENTIL HFA;VENTOLIN HFA) 108 (90 BASE) MCG/ACT inhaler Inhale 2 puffs into the lungs every 6 (six) hours as needed for wheezing or shortness of breath. 1 Inhaler 2  . aspirin 81 MG tablet Take 81 mg by mouth daily.      . diclofenac (VOLTAREN) 75 MG EC tablet Take 1 tablet (75 mg total) by mouth 2 (two) times daily. 60 tablet 11  . doxazosin (CARDURA) 4 MG tablet Take 1 tablet (4 mg total) by mouth at bedtime. 90 tablet 3  . esomeprazole (NEXIUM) 40 MG capsule Take 1 capsule (40 mg total) by mouth daily before breakfast. 90 capsule 3  . fexofenadine (ALLEGRA) 180 MG tablet Take 180 mg by mouth as needed for allergies or rhinitis.    . finasteride (PROSCAR) 5 MG tablet Take 1 tablet (5 mg total) by mouth daily. 90 tablet 3  . fluticasone (FLONASE) 50 MCG/ACT nasal spray Place 2 sprays into the nose daily as needed (for nasal congestion at bedtime).     . LORazepam (ATIVAN) 0.5 MG tablet TAKE ONE TABLET AT BEDTIME AS NEEDED 30 tablet 0  . traZODone (DESYREL) 50 MG tablet TAKE 1 BY MOUTH AT BEDTIME AS NEEDED 90 tablet 3   No current facility-administered medications on file prior to visit.    Allergies  Allergen Reactions  . Atorvastatin     REACTION: myalgias  . Oxycodone Nausea And Vomiting  . Simvastatin     REACTION: joint and stomach pain  . Latex Rash    Local area  . Statins Other (See Comments)    Joint pain    Past Medical History  Diagnosis Date  .  Allergy   . Diverticulitis   . GERD (gastroesophageal reflux disease)   . Hyperlipidemia   . Anxiety   . Osteoarthritis   . Spinal stenosis   . BPH (benign prostatic hypertrophy)   . Kidney stones   . Obstructive sleep apnea     CPAP  . Heart murmur   . Sleep apnea     cpap nightly  . Osteoporosis     Past Surgical History  Procedure Laterality Date  . Tendon repair      Left knee tendon repair--1966  . Vasectomy      Vasectomy/spermatocele/?testicular cyst--1970  . Rotator cuff repair      R rotator cuff repair--1/00  Noemi Chapel)  . Eye muscle surgery      L eye muscle surgery--2/01  . Esophagogastroduodenoscopy      colon--6/04, Barrett's--7/05, Barrett's but no cancer--4/08  . Knee arthroscopy      7/ 09 Arthroscopy left knee--Dr Noemi Chapel  . Joint replacement  1/12    left TKR  . Lipoma excision Left 2010    compressing nerve by spine---Dr Terald Sleeper at [facility]  . Rotator cuff repair Left ~2005 & 11/15    biceps repair also in 2015  . Total knee arthroplasty Right  4/16    Family History  Problem Relation Age of Onset  . Hypertension Mother   . Breast cancer Mother   . Diabetes Neg Hx   . Colon cancer Neg Hx   . Colon polyps Mother   . Coronary artery disease Mother   . Esophageal cancer Neg Hx   . Gallbladder disease Neg Hx   . Kidney disease Neg Hx     Social History   Social History  . Marital Status: Married    Spouse Name: N/A  . Number of Children: 3  . Years of Education: N/A   Occupational History  . retired    Social History Main Topics  . Smoking status: Never Smoker   . Smokeless tobacco: Never Used  . Alcohol Use: 0.0 oz/week    0 Standard drinks or equivalent per week     Comment: occassional wine  . Drug Use: No  . Sexual Activity: Not on file   Other Topics Concern  . Not on file   Social History Narrative   Lives at home with wife      Has living will   Wife is health care POA   Would accept resuscitation attempts   No tube  feeds if cognitively unaware            Review of Systems No sick Recent pulmonary visit--will be seeing [facility] pulmonologist after some testing    Objective:   Physical Exam  Constitutional: He appears well-developed and well-nourished. No distress.  Skin:  Mild inflammatory changes on tip of nose and cheeks          Assessment & Plan:

## 2015-03-24 ENCOUNTER — Encounter: Payer: Self-pay | Admitting: Internal Medicine

## 2015-03-24 DIAGNOSIS — R0602 Shortness of breath: Secondary | ICD-10-CM | POA: Diagnosis not present

## 2015-03-24 DIAGNOSIS — R0902 Hypoxemia: Secondary | ICD-10-CM | POA: Diagnosis not present

## 2015-03-31 ENCOUNTER — Telehealth: Payer: Self-pay | Admitting: *Deleted

## 2015-03-31 NOTE — Telephone Encounter (Signed)
Called pt to inform that he needs O2 @ 2L at night. Pt stated he wanted to setup appt with KK to discuss this. appt scheduled. Nothing further needed.

## 2015-04-13 ENCOUNTER — Ambulatory Visit: Payer: Medicare Other | Admitting: Internal Medicine

## 2015-04-22 ENCOUNTER — Encounter: Payer: Self-pay | Admitting: Internal Medicine

## 2015-04-23 ENCOUNTER — Other Ambulatory Visit: Payer: Self-pay | Admitting: Internal Medicine

## 2015-04-23 ENCOUNTER — Encounter: Payer: Self-pay | Admitting: Internal Medicine

## 2015-04-23 ENCOUNTER — Ambulatory Visit (INDEPENDENT_AMBULATORY_CARE_PROVIDER_SITE_OTHER): Payer: Medicare Other | Admitting: Internal Medicine

## 2015-04-23 ENCOUNTER — Ambulatory Visit: Payer: Medicare Other | Attending: Internal Medicine

## 2015-04-23 ENCOUNTER — Other Ambulatory Visit: Payer: Self-pay | Admitting: Cardiology

## 2015-04-23 VITALS — BP 138/86 | HR 82 | Ht 69.5 in | Wt 243.0 lb

## 2015-04-23 DIAGNOSIS — G4719 Other hypersomnia: Secondary | ICD-10-CM | POA: Diagnosis not present

## 2015-04-23 DIAGNOSIS — G4731 Primary central sleep apnea: Secondary | ICD-10-CM

## 2015-04-23 DIAGNOSIS — R918 Other nonspecific abnormal finding of lung field: Secondary | ICD-10-CM

## 2015-04-23 DIAGNOSIS — G4739 Other sleep apnea: Secondary | ICD-10-CM

## 2015-04-23 DIAGNOSIS — J986 Disorders of diaphragm: Secondary | ICD-10-CM | POA: Diagnosis not present

## 2015-04-23 DIAGNOSIS — G4733 Obstructive sleep apnea (adult) (pediatric): Secondary | ICD-10-CM | POA: Diagnosis not present

## 2015-04-23 DIAGNOSIS — G4737 Central sleep apnea in conditions classified elsewhere: Secondary | ICD-10-CM

## 2015-04-23 DIAGNOSIS — G4700 Insomnia, unspecified: Secondary | ICD-10-CM

## 2015-04-23 DIAGNOSIS — J310 Chronic rhinitis: Secondary | ICD-10-CM

## 2015-04-23 MED ORDER — ESZOPICLONE 2 MG PO TABS: 2.0000 mg | ORAL_TABLET | Freq: Every day | ORAL | Status: DC

## 2015-04-23 NOTE — Progress Notes (Signed)
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  --View of her download data: element of central sleep apnea as well as mild obstructive sleep apnea, which are causing the elevated AHI-- this may be complicated by elevated diaphragm.  Elevated hemidiaphragm.  --With chronic respiratory failure. May be contributing to central sleep apnea. --Will check ABG.   Insomnia.  --Wakes nightly around 2 am, he attributes this to urination and nasal congestion, however he may now be habituated to this schedule.  --Currently taking trazodone, will switch to lunesta for 1 week, if no improvement. Can switch back to trazodone.  Chronic rhinitis.  --Continue flonase qhs, and allegra.   --We'll change Flonase to dymista, given one sample asked to call back if it helps and we'll give him a prescription for this.   Excessive daytime sleepiness.  --Likely multifactorial from OSA as well as chronic insomnia with short sleep time.    Date: 04/23/2015  MRN# [id number] Arthur FILLERS 1940/07/03  Referring Physician:   EMMANUELLE Johnston is a 74 y.o. old male seen in consultation for chief complaint of:    Chief Complaint  Patient presents with  . Advice Only    sleep consult per Kasa; around 2am nose feels stopped up and feels that he has apnea spells;     HPI:   Patient is a 74 year old male who sees my partner, Dr. Mortimer Fries for respiratory issues, and elevated diaphragm. Per history, the patient has a history of obstructive sleep apnea and is on BiPAP. The patient typically goes to bed around 10 PM, he falls asleep within 1 minute, he wakes approximately 2 times per night. He typically wakes up and gets up from bed between 5 to 6 AM.  His first sleep study was around 1.5 yrs ago, then had a cpap titration study but then went for a bipap titration study.  He notes that at night he  wakes himself up at night due to "not breathing" he was then using the bipap and notes that he is doing much better since that time. He also notes that his AHI on his bipap downloads have been creeping up.  He has been taking trazodone for a few years, he is feeling tired during the day, mostly at the end of the day.   He notes that around 2:30 am he wakes up due to clogged nasal passages, he puts some essential oils on, he reads for 30 min, then will feel better and will go back to bed and put the mask back on. About half the time he feels that he does not really get back to sleep but will toss and turn and look at the clock.  He has been waking up at 2:30 am at least 5 years to urinate and notes that his nasal passages are clogged. He notes that   He has a cat at home, the cat does not sleep in the bedroom.  He had an oxygen test done recently while on bipap.   Review of testing:  AV calcification, PAP 33; EF 55%.   PMHX:   Past Medical History  Diagnosis Date  . Allergy   . Diverticulitis   . GERD (gastroesophageal reflux disease)   . Hyperlipidemia   . Anxiety   . Osteoarthritis   . Spinal stenosis   . BPH (benign prostatic hypertrophy)   . Kidney stones   .  Obstructive sleep apnea     CPAP  . Heart murmur   . Sleep apnea     cpap nightly  . Osteoporosis    Surgical Hx:  Past Surgical History  Procedure Laterality Date  . Tendon repair      Left knee tendon repair--1966  . Vasectomy      Vasectomy/spermatocele/?testicular cyst--1970  . Rotator cuff repair      R rotator cuff repair--1/00  Noemi Chapel)  . Eye muscle surgery      L eye muscle surgery--2/01  . Esophagogastroduodenoscopy      colon--6/04, Barrett's--7/05, Barrett's but no cancer--4/08  . Knee arthroscopy      7/ 09 Arthroscopy left knee--Dr Noemi Chapel  . Joint replacement  1/12    left TKR  . Lipoma excision Left 2010    compressing nerve by spine---[facility]  . Rotator cuff repair Left ~2005 &  11/15    biceps repair also in 2015  . Total knee arthroplasty Right 4/16   Family Hx:  Family History  Problem Relation Age of Onset  . Hypertension Mother   . Breast cancer Mother   . Diabetes Neg Hx   . Colon cancer Neg Hx   . Colon polyps Mother   . Coronary artery disease Mother   . Esophageal cancer Neg Hx   . Gallbladder disease Neg Hx   . Kidney disease Neg Hx    Social Hx:   Social History  Substance Use Topics  . Smoking status: Never Smoker   . Smokeless tobacco: Never Used  . Alcohol Use: 0.0 oz/week    0 Standard drinks or equivalent per week     Comment: occassional wine   Medication:   Current Outpatient Rx  Name  Route  Sig  Dispense  Refill  . acetaminophen (TYLENOL) 500 MG tablet   Oral   Take 500 mg by mouth every 8 (eight) hours as needed (for pain).          Marland Kitchen albuterol (PROVENTIL HFA;VENTOLIN HFA) 108 (90 BASE) MCG/ACT inhaler   Inhalation   Inhale 2 puffs into the lungs every 6 (six) hours as needed for wheezing or shortness of breath.   1 Inhaler   2   . aspirin 81 MG tablet   Oral   Take 81 mg by mouth daily.           . diclofenac (VOLTAREN) 75 MG EC tablet   Oral   Take 1 tablet (75 mg total) by mouth 2 (two) times daily.   60 tablet   11   . doxazosin (CARDURA) 4 MG tablet   Oral   Take 1 tablet (4 mg total) by mouth at bedtime.   90 tablet   3   . esomeprazole (NEXIUM) 40 MG capsule   Oral   Take 1 capsule (40 mg total) by mouth daily before breakfast.   90 capsule   3   . fexofenadine (ALLEGRA) 180 MG tablet   Oral   Take 180 mg by mouth as needed for allergies or rhinitis.         . finasteride (PROSCAR) 5 MG tablet   Oral   Take 1 tablet (5 mg total) by mouth daily.   90 tablet   3   . fluticasone (FLONASE) 50 MCG/ACT nasal spray   Nasal   Place 2 sprays into the nose daily as needed (for nasal congestion at bedtime).          Marland Kitchen  LORazepam (ATIVAN) 0.5 MG tablet      TAKE ONE TABLET AT BEDTIME AS  NEEDED   30 tablet   0   . metroNIDAZOLE (METROGEL) 0.75 % gel   Topical   Apply 1 application topically 2 (two) times daily.   45 g   11   . traZODone (DESYREL) 50 MG tablet      TAKE 1 BY MOUTH AT BEDTIME AS NEEDED   90 tablet   3       Allergies:  Atorvastatin; Oxycodone; Simvastatin; Latex; and Statins  Review of Systems: Gen:  Denies  fever, sweats, chills HEENT: Denies blurred vision, double vision. bleeds, sore throat Cvc:  No dizziness, chest pain. Resp:   Denies cough or sputum porduction,  Gi: Denies swallowing difficulty, stomach pain. Gu:  Denies bladder incontinence, burning urine Ext:   No Joint pain, stiffness. Skin: No skin rash,  hives Endoc:  No polyuria, polydipsia. Psych: No depression, insomnia. Other:  All other systems were reviewed with the patient and were negative other that what is mentioned in the HPI.   Physical Examination:   VS: BP 138/86 mmHg  Pulse 82  Ht 5' 9.5" (1.765 m)  Wt 243 lb (110.224 kg)  BMI 35.38 kg/m2  SpO2 96%  General Appearance: No distress  Neuro:without focal findings,  speech normal,  HEENT: PERRLA, EOM intact.  [place] 3, congested nasal passages.  Pulmonary: normal breath sounds, No wheezing.  CardiovascularNormal S1,S2.  No m/r/g.   Abdomen: Benign, Soft, non-tender. Renal:  No costovertebral tenderness  GU:  No performed at this time. Endoc: No evident thyromegaly, no signs of acromegaly. Skin:   warm, no rashes, no ecchymosis  Extremities: normal, no cyanosis, clubbing.  Other findings:    LABORATORY PANEL:   CBC No results for input(s): WBC, HGB, HCT, PLT in the last 168 hours. ------------------------------------------------------------------------------------------------------------------  Chemistries  No results for input(s): NA, K, CL, CO2, GLUCOSE, BUN, CREATININE, CALCIUM, MG, AST, ALT, ALKPHOS, BILITOT in the last 168 hours.  Invalid input(s):  GFRCGP ------------------------------------------------------------------------------------------------------------------  Cardiac Enzymes No results for input(s): TROPONINI in the last 168 hours. ------------------------------------------------------------  RADIOLOGY:  No results found.     Thank  you for the consultation and for allowing [facility] to assist in the care of your patient. Our recommendations are noted above.  Please contact us if we can be of further service.   Marda Stalker, MD.  Board Certified in Internal Medicine, Pulmonary Medicine, [place], and Sleep Medicine.  [facility]  Patricia Pesa, M.D.  Vilinda Boehringer, M.D.  Merton Border, M.D

## 2015-04-23 NOTE — Patient Instructions (Addendum)
--  Will check ABG.  --Lunesta 2 mg qhs, stop trazodone. If not improved in 1 week may switch back to trazodone.  --Switch from fluticasone to dymista, same dose every night. If the sample works, then called for a prescription.  --Continue bipap every night.

## 2015-04-27 ENCOUNTER — Ambulatory Visit (INDEPENDENT_AMBULATORY_CARE_PROVIDER_SITE_OTHER): Payer: Medicare Other | Admitting: Internal Medicine

## 2015-04-27 ENCOUNTER — Encounter: Payer: Self-pay | Admitting: Internal Medicine

## 2015-04-27 VITALS — BP 136/72 | HR 82 | Ht 69.5 in | Wt 248.4 lb

## 2015-04-27 DIAGNOSIS — J9811 Atelectasis: Secondary | ICD-10-CM

## 2015-04-27 NOTE — Progress Notes (Signed)
[place] [facility] Consultation      Date: 04/27/2015,   MRN# [id number] [name] Aug 05, 1940 Code Status:  Hosp day:@LENGTHOFSTAYDAYS @ Referring MD: @ATDPROV @     PCP:      AdmissionWeight: 248 lb 6.4 oz (112.674 kg)                 CurrentWeight: 248 lb 6.4 oz (112.674 kg) [name] is a 74 y.o. old male seen in consultation for chronic SOB     CHIEF COMPLAINT:   Chronic SOB   HISTORY OF PRESENT ILLNESS  74 yo white male seen today for follow up chronic SOB, patient with a dx of chronic RT sided elevated hemidiaphragm  Ct chest on 10/28 images reviewed 04/27/2015 Patient doing well overall, some SOB adn DOE withexertion Patient will need oxygen at night time after reviewing results Patient has no chest p ain +lower ext swelling, no signs of infection at this time      MEDICATIONS    Current Medication:  Current outpatient prescriptions:  .  acetaminophen (TYLENOL) 500 MG tablet, Take 500 mg by mouth every 8 (eight) hours as needed (for pain). , Disp: , Rfl:  .  aspirin 81 MG tablet, Take 81 mg by mouth daily.  , Disp: , Rfl:  .  diclofenac (VOLTAREN) 75 MG EC tablet, Take 1 tablet (75 mg total) by mouth 2 (two) times daily., Disp: 60 tablet, Rfl: 11 .  doxazosin (CARDURA) 4 MG tablet, Take 1 tablet (4 mg total) by mouth at bedtime., Disp: 90 tablet, Rfl: 3 .  esomeprazole (NEXIUM) 40 MG capsule, Take 1 capsule (40 mg total) by mouth daily before breakfast., Disp: 90 capsule, Rfl: 3 .  eszopiclone (LUNESTA) 2 MG TABS tablet, Take 1 tablet (2 mg total) by mouth at bedtime. Take immediately before bedtime, Disp: 30 tablet, Rfl: 0 .  fexofenadine (ALLEGRA) 180 MG tablet, Take 180 mg by mouth as needed for allergies or rhinitis., Disp: , Rfl:  .  finasteride (PROSCAR) 5 MG tablet, Take 1 tablet (5 mg total) by mouth daily., Disp: 90 tablet, Rfl: 3 .  fluticasone (FLONASE) 50 MCG/ACT nasal spray, Place 2 sprays into the nose daily as needed (for  nasal congestion at bedtime). , Disp: , Rfl:  .  LORazepam (ATIVAN) 0.5 MG tablet, TAKE ONE TABLET AT BEDTIME AS NEEDED, Disp: 30 tablet, Rfl: 0 .  metroNIDAZOLE (METROGEL) 0.75 % gel, Apply 1 application topically 2 (two) times daily., Disp: 45 g, Rfl: 11 .  traZODone (DESYREL) 50 MG tablet, TAKE 1 BY MOUTH AT BEDTIME AS NEEDED, Disp: 90 tablet, Rfl: 3    ALLERGIES   Atorvastatin; Oxycodone; Simvastatin; Latex; and Statins     REVIEW OF SYSTEMS   Review of Systems  Constitutional: Negative for fever, chills and weight loss.  Eyes: Negative.   Respiratory: Positive for shortness of breath. Negative for cough, hemoptysis, sputum production and wheezing.   Cardiovascular: Positive for leg swelling. Negative for chest pain and orthopnea.  Neurological: Negative for dizziness.  All other systems reviewed and are negative.    VS: BP 136/72 mmHg  Pulse 82  Ht 5' 9.5" (1.765 m)  Wt 248 lb 6.4 oz (112.674 kg)  BMI 36.17 kg/m2  SpO2 96%     PHYSICAL EXAM  Physical Exam  Constitutional: He is oriented to person, place, and time. He appears well-developed and well-nourished. No distress.  Neck: Neck supple.  Cardiovascular: Normal rate, regular rhythm and normal heart sounds.  No murmur heard. Pulmonary/Chest: Effort normal and breath sounds normal. No respiratory distress. He has no wheezes.  Decreased BS RT base  Musculoskeletal: He exhibits edema.  Neurological: He is alert and oriented to person, place, and time.  Skin: He is not diaphoretic.  Psychiatric: He has a normal mood and affect.     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  Patient satisfied with Plan of action and management. All questions answered  [name], M.D.  [facility]  Medical Director [place] Director [facility] Cardio-Pulmonary Department

## 2015-04-27 NOTE — Patient Instructions (Signed)

## 2015-04-28 ENCOUNTER — Telehealth: Payer: Self-pay | Admitting: *Deleted

## 2015-04-28 MED ORDER — AZELASTINE-FLUTICASONE 137-50 MCG/ACT NA SUSP: 1.0000 | Freq: Every day | NASAL | Status: DC

## 2015-04-28 NOTE — Telephone Encounter (Signed)
rx sent to pharmacy. Pt informed. Nothing further needed.

## 2015-04-28 NOTE — Telephone Encounter (Signed)
Pt calling stating that we gave him a sample of Nasal spray and it seemed to be working.  He would like to get more of it.  It Dymista Would like it to be called to [facility]  Please call once done.  He would like another sample as well please.

## 2015-04-30 ENCOUNTER — Telehealth: Payer: Self-pay | Admitting: *Deleted

## 2015-04-30 NOTE — Telephone Encounter (Signed)
PA for Dymista thru [facility] Key: [id number] Pt [id number]  Sent for review  Pharmacy:[facility]  [phone]

## 2015-05-01 NOTE — Telephone Encounter (Signed)
Check CMM and the PA has been denied for Dymista.   Dr. Mortimer Fries please advise if you would like to prescribe Fluticasone and Azelastine nasal sprays seperately. Thanks.

## 2015-05-01 NOTE — Telephone Encounter (Signed)
Did I order Dymista??  Can order separately if needed

## 2015-05-05 MED ORDER — FLUTICASONE PROPIONATE 50 MCG/ACT NA SUSP: 1.0000 | Freq: Every day | NASAL | Status: DC

## 2015-05-05 MED ORDER — AZELASTINE HCL 0.1 % NA SOLN: 1.0000 | Freq: Every day | NASAL | Status: DC

## 2015-05-05 NOTE — Telephone Encounter (Signed)
Per DR ok to send as 2 separate medications. LMOM for pt informing pt how to take. Nothing further needed.

## 2015-05-12 ENCOUNTER — Ambulatory Visit (INDEPENDENT_AMBULATORY_CARE_PROVIDER_SITE_OTHER): Payer: Medicare Other | Admitting: Internal Medicine

## 2015-05-12 ENCOUNTER — Encounter: Payer: Self-pay | Admitting: Internal Medicine

## 2015-05-12 VITALS — BP 132/70 | HR 84 | Temp 98.5°F | Wt 236.5 lb

## 2015-05-12 DIAGNOSIS — J0100 Acute maxillary sinusitis, unspecified: Secondary | ICD-10-CM | POA: Diagnosis not present

## 2015-05-12 MED ORDER — AMOXICILLIN-POT CLAVULANATE 875-125 MG PO TABS: 1.0000 | ORAL_TABLET | Freq: Two times a day (BID) | ORAL | Status: DC

## 2015-05-12 NOTE — Progress Notes (Signed)
HPI  Pt presents to the clinic today with c/o headache, nasal congestion, ear fullness, sore throat and cough. This started 10 days ago. He is blowing yellow mucous out of his nose. The cough is non productive. 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 He denies fever, chills or body aches. He has tried essential oils, Astelin, Allegra, and Tylenol cold and sinus with minimal relief. He has had sinus infections in the past and reports this feels the same. He has had sick contacts.  Review of Systems    Past Medical History  Diagnosis Date  . Allergy   . Diverticulitis   . GERD (gastroesophageal reflux disease)   . Hyperlipidemia   . Anxiety   . Osteoarthritis   . Spinal stenosis   . BPH (benign prostatic hypertrophy)   . Kidney stones   . Obstructive sleep apnea     CPAP  . Heart murmur   . Sleep apnea     cpap nightly  . Osteoporosis     Family History  Problem Relation Age of Onset  . Hypertension Mother   . Breast cancer Mother   . Diabetes Neg Hx   . Colon cancer Neg Hx   . Colon polyps Mother   . Coronary artery disease Mother   . Esophageal cancer Neg Hx   . Gallbladder disease Neg Hx   . Kidney disease Neg Hx     Social History   Social History  . Marital Status: Married    Spouse Name: N/A  . Number of Children: 3  . Years of Education: N/A   Occupational History  . retired    Social History Main Topics  . Smoking status: Never Smoker   . Smokeless tobacco: Never Used  . Alcohol Use: 0.0 oz/week    0 Standard drinks or equivalent per week     Comment: occassional wine  . Drug Use: No  . Sexual Activity: Not on file   Other Topics Concern  . Not on file   Social History Narrative   Lives at home with wife      Has living will   Wife is health care POA   Would accept resuscitation attempts   No tube feeds if cognitively unaware             Allergies  Allergen Reactions  .  Atorvastatin     REACTION: myalgias  . Oxycodone Nausea And Vomiting  . Simvastatin     REACTION: joint and stomach pain  . Latex Rash    Local area  . Statins Other (See Comments)    Joint pain     Constitutional: Positive headache. Denies fatigue, fever or abrupt weight changes.  HEENT:  Positive  facial pain, nasal congestion and sore throat. Denies eye redness, ear pain, ringing in the ears, wax buildup, runny nose or bloody nose. Respiratory: Positive cough and shortness of breath. Denies difficulty breathing.  Cardiovascular: Denies chest pain, chest tightness, palpitations or swelling in the hands or feet.   No other specific complaints in a complete review of systems (except as listed in HPI above).  Objective:  BP 132/70 mmHg  Pulse 84  Temp(Src) 98.5 F (36.9 C) (Oral)  Wt 236 lb 8 oz (107.276 kg)  SpO2 97%   General: Appears his stated age, well developed, well nourished in NAD. HEENT: Head: normal shape and size, maxillary sinus tenderness noted; Eyes: sclera white, no  icterus, conjunctiva pink; Right Ear: Tm's gray and intact, normal light reflex; Left Ear: cerumen impaction; Nose: mucosa boggy and moist, septum midline; Throat/Mouth: + PND. Teeth present, mucosa erythematous and moist, no exudate noted, no lesions or ulcerations noted.  Neck:  No adenopathy noted.  Cardiovascular: Normal rate and rhythm. S1,S2 noted.  No murmur, rubs or gallops noted.  Pulmonary/Chest: Normal effort and positive vesicular breath sounds. No respiratory distress. No wheezes, rales or ronchi noted.      Assessment & Plan:   Acute bacterial sinusitis  Can use a Neti Pot which can be purchased from your local drug store. Flonase 2 sprays each nostril for 3 days and then as needed. Augmentin BID for 10 days  RTC as needed or if symptoms persist.

## 2015-05-12 NOTE — Patient Instructions (Signed)

## 2015-05-12 NOTE — Progress Notes (Signed)
Pre visit review using our clinic review tool, if applicable. No additional management support is needed unless otherwise documented below in the visit note. 

## 2015-05-29 ENCOUNTER — Ambulatory Visit: Payer: Medicare Other

## 2015-05-29 ENCOUNTER — Ambulatory Visit (INDEPENDENT_AMBULATORY_CARE_PROVIDER_SITE_OTHER): Payer: Medicare Other | Admitting: *Deleted

## 2015-05-29 DIAGNOSIS — R0602 Shortness of breath: Secondary | ICD-10-CM

## 2015-05-29 DIAGNOSIS — J9811 Atelectasis: Secondary | ICD-10-CM

## 2015-05-29 LAB — PULMONARY FUNCTION TEST
DL/VA % pred: 106 %
DL/VA: 4.85 ml/min/mmHg/L
DLCO UNC % PRED: 66 %
DLCO UNC: 21.12 ml/min/mmHg
FEF 25-75 PRE: 1.97 L/s
FEF 25-75 Post: 2.41 L/sec
FEF2575-%Change-Post: 22 %
FEF2575-%Pred-Post: 108 %
FEF2575-%Pred-Pre: 88 %
FEV1-%CHANGE-POST: 5 %
FEV1-%PRED-POST: 63 %
FEV1-%Pred-Pre: 60 %
FEV1-POST: 1.94 L
FEV1-Pre: 1.84 L
FEV1FVC-%Change-Post: 0 %
FEV1FVC-%PRED-PRE: 113 %
FEV6-%Change-Post: 4 %
FEV6-%PRED-POST: 58 %
FEV6-%Pred-Pre: 56 %
FEV6-POST: 2.31 L
FEV6-PRE: 2.21 L
FEV6FVC-%PRED-POST: 106 %
FEV6FVC-%PRED-PRE: 106 %
FVC-%Change-Post: 4 %
FVC-%Pred-Post: 55 %
FVC-%Pred-Pre: 52 %
FVC-Post: 2.31 L
FVC-Pre: 2.21 L
PRE FEV6/FVC RATIO: 100 %
Post FEV1/FVC ratio: 84 %
Post FEV6/FVC ratio: 100 %
Pre FEV1/FVC ratio: 83 %
RV % PRED: 271 %
RV: 6.79 L
TLC % pred: 136 %
TLC: 9.46 L

## 2015-05-29 NOTE — Progress Notes (Signed)
SMW performed today. 

## 2015-05-29 NOTE — Progress Notes (Signed)
PFT performed today nitrogen washout.

## 2015-06-01 ENCOUNTER — Ambulatory Visit (INDEPENDENT_AMBULATORY_CARE_PROVIDER_SITE_OTHER): Payer: Medicare Other | Admitting: Internal Medicine

## 2015-06-01 ENCOUNTER — Encounter: Payer: Self-pay | Admitting: Internal Medicine

## 2015-06-01 VITALS — BP 122/74 | HR 74 | Ht 69.5 in | Wt 230.0 lb

## 2015-06-01 DIAGNOSIS — J986 Disorders of diaphragm: Secondary | ICD-10-CM | POA: Diagnosis not present

## 2015-06-01 MED ORDER — ESZOPICLONE 2 MG PO TABS: 2.0000 mg | ORAL_TABLET | Freq: Every day | ORAL | Status: DC

## 2015-06-01 MED ORDER — AZELASTINE-FLUTICASONE 137-50 MCG/ACT NA SUSP: 1.0000 | Freq: Every day | NASAL | Status: DC

## 2015-06-01 NOTE — Progress Notes (Signed)
[name] [facility] Consultation      Date: 06/01/2015,   MRN# [id number] [name] [birth year] Code Status:  Hosp day:@LENGTHOFSTAYDAYS @ Referring MD: @ATDPROV @     PCP:      AdmissionWeight: 230 lb (104.327 kg)                 CurrentWeight: 230 lb (104.327 kg) [name] is a 75 y.o. old male seen in consultation for chronic SOB  6MWT WNL 05/2015 PFT-ratio 83%, FVC 52%, FEV1 60%, TLC 3.18 L 45%-moderate restrictive lung disease mdoerate  CHIEF COMPLAINT:   Follow up Chronic SOB   HISTORY OF PRESENT ILLNESS  patient with a dx of chronic RT sided elevated hemidiaphragm  Ct chest on 10/28 images reviewed 06/01/2015 Patient doing well overall, some SOB adn DOE with exertion-which has gotten better with losing weight approx 15 pound weight loss Patient will need oxygen at night time after reviewing results-he refuses at this time, would like to los e weight and get re-tested Patient has no chest p ain No ower ext swelling, no signs of infection at this time    MEDICATIONS    Current Medication:  Current outpatient prescriptions:  .  acetaminophen (TYLENOL) 500 MG tablet, Take 500 mg by mouth every 8 (eight) hours as needed (for pain). , Disp: , Rfl:  .  aspirin 81 MG tablet, Take 81 mg by mouth daily.  , Disp: , Rfl:  .  Azelastine-Fluticasone 137-50 MCG/ACT SUSP, Place 1 spray into the nose daily., Disp: 69 g, Rfl: 3 .  diclofenac (VOLTAREN) 75 MG EC tablet, Take 1 tablet (75 mg total) by mouth 2 (two) times daily., Disp: 60 tablet, Rfl: 11 .  doxazosin (CARDURA) 4 MG tablet, Take 1 tablet (4 mg total) by mouth at bedtime., Disp: 90 tablet, Rfl: 3 .  esomeprazole (NEXIUM) 40 MG capsule, Take 1 capsule (40 mg total) by mouth daily before breakfast., Disp: 90 capsule, Rfl: 3 .  eszopiclone (LUNESTA) 2 MG TABS tablet, Take 1 tablet (2 mg total) by mouth at bedtime. Take immediately before bedtime, Disp: 30 tablet, Rfl: 0 .  fexofenadine (ALLEGRA) 180 MG  tablet, Take 180 mg by mouth as needed for allergies or rhinitis., Disp: , Rfl:  .  finasteride (PROSCAR) 5 MG tablet, Take 1 tablet (5 mg total) by mouth daily., Disp: 90 tablet, Rfl: 3 .  metroNIDAZOLE (METROGEL) 0.75 % gel, Apply 1 application topically 2 (two) times daily., Disp: 45 g, Rfl: 11    ALLERGIES   Atorvastatin; Oxycodone; Simvastatin; Latex; and Statins     REVIEW OF SYSTEMS   Review of Systems  Constitutional: Negative for fever, chills, weight loss and malaise/fatigue.  Eyes: Negative.   Respiratory: Positive for shortness of breath. Negative for cough, hemoptysis, sputum production and wheezing.   Cardiovascular: Negative for chest pain, orthopnea and leg swelling.  All other systems reviewed and are negative.    VS: BP 122/74 mmHg  Pulse 74  Ht 5' 9.5" (1.765 m)  Wt 230 lb (104.327 kg)  BMI 33.49 kg/m2  SpO2 94%     PHYSICAL EXAM  Physical Exam  Constitutional: He is oriented to person, place, and time. He appears well-developed and well-nourished. No distress.  Cardiovascular: Normal rate, regular rhythm and normal heart sounds.   No murmur heard. Pulmonary/Chest: Effort normal and breath sounds normal. No respiratory distress. He has no wheezes.  Decreased BS RT base  Musculoskeletal: He exhibits no edema.  Neurological: He is alert  and oriented to person, place, and time.  Skin: He is not diaphoretic.  Psychiatric: He has a normal mood and affect.     ASSESSMENT/PLAN   75 yo white male with chronic SOB with DOE with chronic elevated hemidiaphragm etiology is unknown, patient SOB and DOE has improved with weigh loss Will not refer to CT surgery at this time, patient doing well overall, no acute complaints at this time  1.albuterol as needed- 2.weight loss and exercise  3.continue biPAP at night for OSA 4.patient will need oxygen at night time-however wants to be re-tested in 3 months after losing weight   Follow up in 3 months  The  Patient requires high complexity decision making for assessment and support, frequent evaluation and titration of therapies, application of advanced monitoring technologies and extensive interpretation of multiple databases.  Patient satisfied with Plan of action and management. All questions answered  [name], M.D.  [facility]  Medical Director [place] Director [facility] Cardio-Pulmonary Department

## 2015-06-01 NOTE — Patient Instructions (Signed)

## 2015-06-01 NOTE — Progress Notes (Signed)
[place] [facility] Consultation      Date: 06/01/2015,   MRN# [id number] [name] 1941/04/25 Code Status:  Hosp day:@LENGTHOFSTAYDAYS @ Referring MD: @ATDPROV @     PCP:      AdmissionWeight: 230 lb (104.327 kg)                 CurrentWeight: 230 lb (104.327 kg) [name] is a 75 y.o. old male seen in consultation for chronic SOB     CHIEF COMPLAINT:   Chronic SOB   HISTORY OF PRESENT ILLNESS  75 yo white male seen today for follow up chronic SOB, patient with a dx of chronic RT sided elevated hemidiaphragm  Ct chest on 10/28 images reviewed 06/01/2015 Patient doing well overall, some SOB adn DOE withexertion Patient will need oxygen at night time after reviewing results Patient has no chest p ain +lower ext swelling, no signs of infection at this time      MEDICATIONS    Current Medication:  Current outpatient prescriptions:  .  acetaminophen (TYLENOL) 500 MG tablet, Take 500 mg by mouth every 8 (eight) hours as needed (for pain). , Disp: , Rfl:  .  aspirin 81 MG tablet, Take 81 mg by mouth daily.  , Disp: , Rfl:  .  Azelastine-Fluticasone 137-50 MCG/ACT SUSP, Place 1 spray into the nose daily., Disp: 69 g, Rfl: 3 .  diclofenac (VOLTAREN) 75 MG EC tablet, Take 1 tablet (75 mg total) by mouth 2 (two) times daily., Disp: 60 tablet, Rfl: 11 .  doxazosin (CARDURA) 4 MG tablet, Take 1 tablet (4 mg total) by mouth at bedtime., Disp: 90 tablet, Rfl: 3 .  esomeprazole (NEXIUM) 40 MG capsule, Take 1 capsule (40 mg total) by mouth daily before breakfast., Disp: 90 capsule, Rfl: 3 .  eszopiclone (LUNESTA) 2 MG TABS tablet, Take 1 tablet (2 mg total) by mouth at bedtime. Take immediately before bedtime, Disp: 30 tablet, Rfl: 0 .  fexofenadine (ALLEGRA) 180 MG tablet, Take 180 mg by mouth as needed for allergies or rhinitis., Disp: , Rfl:  .  finasteride (PROSCAR) 5 MG tablet, Take 1 tablet (5 mg total) by mouth daily., Disp: 90 tablet, Rfl: 3 .   metroNIDAZOLE (METROGEL) 0.75 % gel, Apply 1 application topically 2 (two) times daily., Disp: 45 g, Rfl: 11    ALLERGIES   Atorvastatin; Oxycodone; Simvastatin; Latex; and Statins     REVIEW OF SYSTEMS   Review of Systems  Constitutional: Negative for fever, chills and weight loss.  Eyes: Negative.   Respiratory: Positive for shortness of breath. Negative for cough, hemoptysis, sputum production and wheezing.   Cardiovascular: Positive for leg swelling. Negative for chest pain and orthopnea.  Neurological: Negative for dizziness.  All other systems reviewed and are negative.    VS: BP 122/74 mmHg  Pulse 74  Ht 5' 9.5" (1.765 m)  Wt 230 lb (104.327 kg)  BMI 33.49 kg/m2  SpO2 94%     PHYSICAL EXAM  Physical Exam  Constitutional: He is oriented to person, place, and time. He appears well-developed and well-nourished. No distress.  Neck: Neck supple.  Cardiovascular: Normal rate, regular rhythm and normal heart sounds.   No murmur heard. Pulmonary/Chest: Effort normal and breath sounds normal. No respiratory distress. He has no wheezes.  Decreased BS RT base  Musculoskeletal: He exhibits edema.  Neurological: He is alert and oriented to person, place, and time.  Skin: He is not diaphoretic.  Psychiatric: He has a normal mood and  affect.     ASSESSMENT/PLAN   75 yo white male with chronic SOB with DOE with chronic elevated hemidiaphragm etiology is unknown  1.albuterol as needed- 2.obtain PFT's with 6MWT before next visit 3.CT chest:shows RLL lung atalectasis-on biPAP at night for OSA 4.follow up with Dr Ashby Dawes for sleep disorders as needed 5.patient will need oxygen at night time    The Patient requires high complexity decision making for assessment and support, frequent evaluation and titration of therapies, application of advanced monitoring technologies and extensive interpretation of multiple databases.  Patient satisfied with Plan of action and  management. All questions answered  [name], M.D.  [facility]  Medical Director [name] Director [facility] Cardio-Pulmonary Department

## 2015-06-08 ENCOUNTER — Ambulatory Visit: Payer: Medicare Other | Admitting: Internal Medicine

## 2015-06-08 ENCOUNTER — Encounter: Payer: Self-pay | Admitting: Internal Medicine

## 2015-06-08 ENCOUNTER — Ambulatory Visit (INDEPENDENT_AMBULATORY_CARE_PROVIDER_SITE_OTHER): Payer: Medicare Other | Admitting: Internal Medicine

## 2015-06-08 VITALS — BP 120/68 | HR 89 | Temp 98.1°F | Wt 231.0 lb

## 2015-06-08 DIAGNOSIS — J0100 Acute maxillary sinusitis, unspecified: Secondary | ICD-10-CM | POA: Diagnosis not present

## 2015-06-08 MED ORDER — AMOXICILLIN 500 MG PO TABS: 1000.0000 mg | ORAL_TABLET | Freq: Two times a day (BID) | ORAL | Status: DC

## 2015-06-08 MED ORDER — DOXAZOSIN MESYLATE 4 MG PO TABS: 4.0000 mg | ORAL_TABLET | Freq: Every day | ORAL | Status: DC

## 2015-06-08 MED ORDER — FINASTERIDE 5 MG PO TABS: 5.0000 mg | ORAL_TABLET | Freq: Every day | ORAL | Status: DC

## 2015-06-08 NOTE — Assessment & Plan Note (Signed)
Only 4-5 days but clear worsening in past 2 days Will go ahead with amoxil Supportive Rx

## 2015-06-08 NOTE — Addendum Note (Signed)
Addended by: Despina Hidden on: 06/08/2015 11:38 AM   Modules accepted: Orders

## 2015-06-08 NOTE — Progress Notes (Signed)
Pre visit review using our clinic review tool, if applicable. No additional management support is needed unless otherwise documented below in the visit note. 

## 2015-06-08 NOTE — Progress Notes (Signed)
Subjective:    Patient ID: [name], male    DOB: 05-May-1941, 75 y.o.   MRN: [id number]  HPI Here due to respiratory illness Started ~4 days ago Now coughing--productive of green sputum in last 2 days Feels his lungs are clear--but thinks sputum might be from chest (as well as drainage) No fever--- but had low grade 2 nights ago and yesterday AM Tylenol did help (with decongestant) Some chronic SOB--- no change No sore throat  Ears are popping with swallowing but not otherwise painful  Has been using essential oils for his congestion chronically as well--helps some now  Current Outpatient Prescriptions on File Prior to Visit  Medication Sig Dispense Refill  . acetaminophen (TYLENOL) 500 MG tablet Take 500 mg by mouth every 8 (eight) hours as needed (for pain).     Marland Kitchen aspirin 81 MG tablet Take 81 mg by mouth daily.      . Azelastine-Fluticasone 137-50 MCG/ACT SUSP Place 1 spray into the nose daily. 69 g 3  . diclofenac (VOLTAREN) 75 MG EC tablet Take 1 tablet (75 mg total) by mouth 2 (two) times daily. 60 tablet 11  . doxazosin (CARDURA) 4 MG tablet Take 1 tablet (4 mg total) by mouth at bedtime. 90 tablet 3  . esomeprazole (NEXIUM) 40 MG capsule Take 1 capsule (40 mg total) by mouth daily before breakfast. 90 capsule 3  . eszopiclone (LUNESTA) 2 MG TABS tablet Take 1 tablet (2 mg total) by mouth at bedtime. Take immediately before bedtime 90 tablet 0  . fexofenadine (ALLEGRA) 180 MG tablet Take 180 mg by mouth as needed for allergies or rhinitis.    . finasteride (PROSCAR) 5 MG tablet Take 1 tablet (5 mg total) by mouth daily. 90 tablet 3  . metroNIDAZOLE (METROGEL) 0.75 % gel Apply 1 application topically 2 (two) times daily. 45 g 11   No current facility-administered medications on file prior to visit.    Allergies  Allergen Reactions  . Atorvastatin     REACTION: myalgias  . Oxycodone Nausea And Vomiting  . Simvastatin     REACTION: joint and stomach pain  . Latex  Rash    Local area  . Statins Other (See Comments)    Joint pain    Past Medical History  Diagnosis Date  . Allergy   . Diverticulitis   . GERD (gastroesophageal reflux disease)   . Hyperlipidemia   . Anxiety   . Osteoarthritis   . Spinal stenosis   . BPH (benign prostatic hypertrophy)   . Kidney stones   . Obstructive sleep apnea     CPAP  . Heart murmur   . Sleep apnea     cpap nightly  . Osteoporosis     Past Surgical History  Procedure Laterality Date  . Tendon repair      Left knee tendon repair--1966  . Vasectomy      Vasectomy/spermatocele/?testicular cyst--1970  . Rotator cuff repair      R rotator cuff repair--1/00  Noemi Chapel)  . Eye muscle surgery      L eye muscle surgery--2/01  . Esophagogastroduodenoscopy      colon--6/04, Barrett's--7/05, Barrett's but no cancer--4/08  . Knee arthroscopy      7/ 09 Arthroscopy left knee--Dr Noemi Chapel  . Joint replacement  1/12    left TKR  . Lipoma excision Left 2010    compressing nerve by spine---Dr Terald Sle[facility] at [facility]  . Rotator cuff repair Left ~2005 & 11/15  biceps repair also in 2015  . 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 Hypertension Mother   . Breast cancer Mother   . Diabetes Neg Hx   . Colon cancer Neg Hx   . Colon polyps Mother   . Coronary artery disease Mother   . Esophageal cancer Neg Hx   . Gallbladder disease Neg Hx   . Kidney disease Neg Hx     Social History   Social History  . Marital Status: Married    Spouse Name: N/A  . Number of Children: 3  . Years of Education: N/A   Occupational History  . retired    Social History Main Topics  . Smoking status: Never Smoker   . Smokeless tobacco: Never Used  . Alcohol Use: 0.0 oz/week    0 Standard drinks or equivalent per week     Comment: occassional wine  . Drug Use: No  . Sexual Activity: Not on file   Other Topics Concern  . Not on file   Social History Narrative   Lives at  home with wife      Has living will   Wife is health care POA   Would accept resuscitation attempts   No tube feeds if cognitively unaware            Review of Systems Ongoing trouble with nasal congestion--flonase not helping Still on prostate meds--had gotten it from Dr Jeffie Pollock before No N/V Appetite is okay    Objective:   Physical Exam  Constitutional: He appears well-developed and well-nourished. No distress.  HENT:  Mouth/Throat: Oropharynx is clear and moist. No oropharyngeal exudate.  No sinus tenderness TMs normal Moderate nasal inflammation  Neck: Normal range of motion. Neck supple.  Pulmonary/Chest: Effort normal. No respiratory distress. He has no rales.  Very slight expiratory wheeze--but not tight  Lymphadenopathy:    He has no cervical adenopathy.          Assessment & Plan:

## 2015-06-09 DIAGNOSIS — M75122 Complete rotator cuff tear or rupture of left shoulder, not specified as traumatic: Secondary | ICD-10-CM | POA: Diagnosis not present

## 2015-07-02 ENCOUNTER — Telehealth: Payer: Self-pay | Admitting: Internal Medicine

## 2015-07-02 NOTE — Telephone Encounter (Signed)
Lets do PA to see if we can get it approved. If doesn't work out, can try something else.

## 2015-07-02 NOTE — Telephone Encounter (Signed)
Pt calling stating that the sleeping pill that we prescribed for patient really worked for patient But now insurance [employer] is telling him that now that he is almost done with 30 day that he got  It is no longer covered for patient  He would like another version or if we can get it approved  Please advise.

## 2015-07-02 NOTE — Telephone Encounter (Signed)
Pt is referring to the [facility]. States medication is working well. Please advise if you want to change it or if you want me to wait for the prior auth to come to me? Thanks  Pt ask to let you know that he has dropped 21lbs.

## 2015-07-03 NOTE — Telephone Encounter (Signed)
Informed pt that when his next refill is needed to let the pharmacy run it through his insurance to get the rejection so that I can initiate the PA for the Lunesta. Pt states he just received a refill and will let me know when he gets the refill. Nothing further needed at this time.

## 2015-07-06 NOTE — Telephone Encounter (Signed)
This encounter was created in error - please disregard.

## 2015-07-09 ENCOUNTER — Other Ambulatory Visit: Payer: Self-pay | Admitting: *Deleted

## 2015-07-09 MED ORDER — ESZOPICLONE 2 MG PO TABS: 2.0000 mg | ORAL_TABLET | Freq: Every day | ORAL | Status: DC

## 2015-07-09 NOTE — Telephone Encounter (Signed)
Lunesta approved until 07/08/2016. Called [facility] in regards to new rx. Pt only received 30 day supply due to it being non formulary. Called in new rx for Lunesta 2 mg tabs x 90 days 0 refills. Nothing further needed.

## 2015-08-13 ENCOUNTER — Other Ambulatory Visit: Payer: Self-pay

## 2015-08-13 MED ORDER — DICLOFENAC SODIUM 75 MG PO TBEC: 75.0000 mg | DELAYED_RELEASE_TABLET | Freq: Two times a day (BID) | ORAL | Status: DC

## 2015-08-13 NOTE — Telephone Encounter (Signed)
Pt is in [place] for 2 weeks and pt request refill diclofenac to [facility] [place]. Refilled # 60 per protocol.pt voiced understanding.

## 2015-08-14 ENCOUNTER — Telehealth: Payer: Self-pay | Admitting: Internal Medicine

## 2015-08-14 NOTE — Telephone Encounter (Signed)
Patient Name: [name]  DOB: 1940/05/17    Initial Comment Caller states the last three fingers on his left hand are tingling. Some numbness. No left arm pain.   Nurse Assessment  Nurse: Raphael Gibney, RN, Vera Date/Time (Eastern Time): 08/14/2015 12:53:14 PM  Confirm and document reason for call. If symptomatic, describe symptoms. You must click the next button to save text entered. ---Caller states his second finger, third finger and 4 th finger on his left hand are tingling with a little numbness on the very end of his fingers. Has had symptoms about 3 weeks. Has had shoulder surgery. When he woke up, his left hand was numb and he shook his hand and the numbness went away about 3 weeks ago No pain in his arm.  Has the patient traveled out of the country within the last 30 days? ---Not Applicable  Does the patient have any new or worsening symptoms? ---Yes  Will a triage be completed? ---Yes  Related visit to physician within the last 2 weeks? ---No  Does the PT have any chronic conditions? (i.e. diabetes, asthma, etc.) ---No  Is this a behavioral health or substance abuse call? ---No     Guidelines    Guideline Title Affirmed Question Affirmed Notes  Neurologic Deficit [1] Numbness or tingling in one or both hands AND [2] is a chronic symptom (recurrent or ongoing AND present > 4 weeks)    Final Disposition User   See PCP When Office is Open (within 3 days) Raphael Gibney, Therapist, sports, Vanita Ingles    Comments  pt states he is out of town and does not want to be seen within 72 hrs. States he would like appt on April 20th. Please call pt back regarding appt.   Disagree/Comply: Disagree  Disagree/Comply Reason: Disagree with instructions

## 2015-08-14 NOTE — Telephone Encounter (Signed)
Left /vm requesting pt to cb. Per team health note pt is out of town and request appt on 08/27/15.

## 2015-08-14 NOTE — Telephone Encounter (Signed)
Okay to set up appt at his convenience It doesn't sound like an emergency

## 2015-08-17 NOTE — Telephone Encounter (Signed)
I left a message on patient's voice mail to call back and schedule appointment at his convenience.

## 2015-08-26 DIAGNOSIS — M1711 Unilateral primary osteoarthritis, right knee: Secondary | ICD-10-CM | POA: Diagnosis not present

## 2015-08-26 DIAGNOSIS — M25561 Pain in right knee: Secondary | ICD-10-CM | POA: Diagnosis not present

## 2015-08-26 DIAGNOSIS — Z96651 Presence of right artificial knee joint: Secondary | ICD-10-CM | POA: Diagnosis not present

## 2015-08-27 ENCOUNTER — Ambulatory Visit: Payer: Medicare Other | Admitting: Internal Medicine

## 2015-08-27 DIAGNOSIS — M79674 Pain in right toe(s): Secondary | ICD-10-CM | POA: Diagnosis not present

## 2015-08-27 DIAGNOSIS — M79675 Pain in left toe(s): Secondary | ICD-10-CM | POA: Diagnosis not present

## 2015-08-27 DIAGNOSIS — B351 Tinea unguium: Secondary | ICD-10-CM | POA: Diagnosis not present

## 2015-09-01 ENCOUNTER — Ambulatory Visit (INDEPENDENT_AMBULATORY_CARE_PROVIDER_SITE_OTHER): Payer: Medicare Other | Admitting: Internal Medicine

## 2015-09-01 ENCOUNTER — Encounter: Payer: Self-pay | Admitting: Internal Medicine

## 2015-09-01 VITALS — BP 130/78 | HR 96 | Ht 69.0 in | Wt 218.2 lb

## 2015-09-01 DIAGNOSIS — G4733 Obstructive sleep apnea (adult) (pediatric): Secondary | ICD-10-CM

## 2015-09-01 DIAGNOSIS — K219 Gastro-esophageal reflux disease without esophagitis: Secondary | ICD-10-CM

## 2015-09-01 DIAGNOSIS — J986 Disorders of diaphragm: Secondary | ICD-10-CM

## 2015-09-01 MED ORDER — ESOMEPRAZOLE MAGNESIUM 40 MG PO CPDR: 40.0000 mg | DELAYED_RELEASE_CAPSULE | Freq: Every day | ORAL | Status: DC

## 2015-09-01 MED ORDER — ESOMEPRAZOLE MAGNESIUM 40 MG PO PACK: 40.0000 mg | PACK | Freq: Every day | ORAL | Status: DC

## 2015-09-01 NOTE — Patient Instructions (Signed)

## 2015-09-01 NOTE — Progress Notes (Signed)
[facility] Consultation      Date: 09/01/2015,   MRN# [id number] [name] 01/24/1941 Code Status:  Hosp day:@LENGTHOFSTAYDAYS @ Referring MD: @ATDPROV @     PCP:      Admission                  Current  [name] is a 75 y.o. old male seen in consultation for chronic SOB     CHIEF COMPLAINT:   Chronic SOB   HISTORY OF PRESENT ILLNESS  75 yo white male seen today for follow up chronic SOB, patient with a dx of chronic RT sided elevated hemidiaphragm  patient doing well overall,  No SOb, walks 3 miles per week, no DOE, lost approx 30 pounds in last 3 months on diet and exercise regimen No signs of infection at this time  PFT 05/29/15 fvc 52% fev1 60%, ratio 83% DLCO 66%  findings  suggest moderate restrictive lung disease BiPAP reports: 100% compliance 6MWT WNL   BIPAP   MEDICATIONS    Current Medication:  Current outpatient prescriptions:  .  acetaminophen (TYLENOL) 500 MG tablet, Take 500 mg by mouth every 8 (eight) hours as needed (for pain). , Disp: , Rfl:  .  amoxicillin (AMOXIL) 500 MG tablet, Take 2 tablets (1,000 mg total) by mouth 2 (two) times daily., Disp: 40 tablet, Rfl: 0 .  aspirin 81 MG tablet, Take 81 mg by mouth daily.  , Disp: , Rfl:  .  Azelastine-Fluticasone 137-50 MCG/ACT SUSP, Place 1 spray into the nose daily., Disp: 69 g, Rfl: 3 .  diclofenac (VOLTAREN) 75 MG EC tablet, Take 1 tablet (75 mg total) by mouth 2 (two) times daily., Disp: 60 tablet, Rfl: 0 .  doxazosin (CARDURA) 4 MG tablet, Take 1 tablet (4 mg total) by mouth at bedtime., Disp: 90 tablet, Rfl: 3 .  esomeprazole (NEXIUM) 40 MG capsule, Take 1 capsule (40 mg total) by mouth daily before breakfast., Disp: 90 capsule, Rfl: 3 .  eszopiclone (LUNESTA) 2 MG TABS tablet, Take 1 tablet (2 mg total) by mouth at bedtime. Take immediately before bedtime, Disp: 90 tablet, Rfl: 0 .  fexofenadine (ALLEGRA) 180 MG tablet, Take 180 mg by mouth as needed for  allergies or rhinitis., Disp: , Rfl:  .  finasteride (PROSCAR) 5 MG tablet, Take 1 tablet (5 mg total) by mouth daily., Disp: 90 tablet, Rfl: 3 .  metroNIDAZOLE (METROGEL) 0.75 % gel, Apply 1 application topically 2 (two) times daily., Disp: 45 g, Rfl: 11    ALLERGIES   Atorvastatin; Oxycodone; Simvastatin; Latex; and Statins     REVIEW OF SYSTEMS   Review of Systems  Constitutional: Negative for fever, chills and weight loss.  Eyes: Negative.   Respiratory: Positive for shortness of breath. Negative for cough, hemoptysis, sputum production and wheezing.   Cardiovascular: Positive for leg swelling. Negative for chest pain and orthopnea.  Neurological: Negative for dizziness.  All other systems reviewed and are negative.    VS: There were no vitals taken for this visit.     PHYSICAL EXAM  Physical Exam  Constitutional: He is oriented to person, place, and time. He appears well-developed and well-nourished. No distress.  Neck: Neck supple.  Cardiovascular: Normal rate, regular rhythm and normal heart sounds.   No murmur heard. Pulmonary/Chest: Effort normal and breath sounds normal. No respiratory distress. He has no wheezes.  Decreased BS RT base  Musculoskeletal: He exhibits no edema.  Neurological: He is alert and oriented  to person, place, and time.  Skin: He is not diaphoretic.  Psychiatric: He has a normal mood and affect.     ASSESSMENT/PLAN   75 yo white male with chronic SOB with DOE with chronic elevated hemidiaphragm etiology is unknown  1.albuterol as needed- 2.CT chest:shows RLL lung atalectasis-on biPAP at night for OSA 3.follow up with Dr Ashby Dawes for sleep disorders as needed 4.patient would like to repeat overnight pulse oximetry 5.referral to Dr. Genevive Bi to evaluate elevated hemidipaphragm   The Patient requires high complexity decision making for assessment and support, frequent evaluation and titration of therapies, application of advanced  monitoring technologies and extensive interpretation of multiple databases.  Patient satisfied with Plan of action and management. All questions answered  [name], M.D.  [facility]  Medical Director [place] Director [facility] Cardio-Pulmonary Department

## 2015-09-03 ENCOUNTER — Other Ambulatory Visit: Payer: Self-pay | Admitting: *Deleted

## 2015-09-03 ENCOUNTER — Inpatient Hospital Stay: Payer: Medicare Other | Attending: Cardiothoracic Surgery | Admitting: Cardiothoracic Surgery

## 2015-09-03 VITALS — BP 119/75 | HR 81 | Temp 96.0°F | Wt 220.7 lb

## 2015-09-03 DIAGNOSIS — J986 Disorders of diaphragm: Secondary | ICD-10-CM | POA: Insufficient documentation

## 2015-09-03 DIAGNOSIS — J9811 Atelectasis: Secondary | ICD-10-CM

## 2015-09-03 NOTE — Progress Notes (Signed)
Patient ID: [name], male   DOB: May 15, 1940, 75 y.o.   MRN: [id number]  Chief Complaint  Patient presents with  . elevated diaphram    Referred By Dr. Mortimer Fries Reason for Referral elevation of right hemidiaphragm  HPI Location, Quality, Duration, Severity, Timing, Context, Modifying Factors, Associated Signs and Symptoms.  [name] is a 75 y.o. male.  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 This occurred after an episode where he inhaled some fumes from dry ice. He states that immediately after inhaling these fumes he was short of breath and presented to his primary care physician where chest x-ray was obtained. A complete evaluation at that time including a CT scan and a fluoroscopy of the diaphragms was unremarkable for any significant pathology. The patient was in his usual state of health but over the last several years has noticed increasing shortness of breath and ultimately came to the presentation of Dr. Louie Bun. Dr. Stoney Bang obtain some pulmonary function studies as well as a CT scan. CT scan was independently reviewed by myself and continues to show an elevation of the right hemidiaphragm. At that point the recommendation was for continued weight reduction and the patient has been diligent and losing weight and has now lost approximately 35 pounds with a significant improvement in his overall quality of life and functional status. He states that he is currently walking 1-2 miles 3 times a week and is feeling quite well. It is his goal to continue exercising and weight reduction as he would like to lose another 20 pounds. There is no history of trauma. There is no evidence of a diaphragmatic hernia. He tells me that the prior diagnosis was of a "frozen diaphragm". I am not able to open the x-rays from 1999 but I do have a report showing an elevation of the right hemidiaphragm. He would like to know if there is anything that can be simply done to  improve the function of the diaphragm and he would like to know what options are available to him.   Past Medical History  Diagnosis Date  . Allergy   . Diverticulitis   . GERD (gastroesophageal reflux disease)   . Hyperlipidemia   . Anxiety   . Osteoarthritis   . Spinal stenosis   . BPH (benign prostatic hypertrophy)   . Kidney stones   . Obstructive sleep apnea     CPAP  . Heart murmur   . Sleep apnea     cpap nightly  . Osteoporosis     Past Surgical History  Procedure Laterality Date  . Tendon repair      Left knee tendon repair--1966  . Vasectomy      Vasectomy/spermatocele/?testicular cyst--1970  . Rotator cuff repair      R rotator cuff repair--1/00  Noemi Chapel)  . Eye muscle surgery      L eye muscle surgery--2/01  . Esophagogastroduodenoscopy      colon--6/04, Barrett's--7/05, Barrett's but no cancer--4/08  . Knee arthroscopy      7/ 09 Arthroscopy left knee--Dr Noemi Chapel  . Joint replacement  1/12    left TKR  . Lipoma excision Left 2010    compressing nerve by spine---[facility]erald Sleeper at [facility]  . Rotator cuff repair Left ~2005 & 11/15    biceps repair also in 2015  . 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 Hypertension Mother   .  Breast cancer Mother   . Diabetes Neg Hx   . Colon cancer Neg Hx   . Colon polyps Mother   . Coronary artery disease Mother   . Esophageal cancer Neg Hx   . Gallbladder disease Neg Hx   . Kidney disease Neg Hx     Social History Social History  Substance Use Topics  . Smoking status: Never Smoker   . Smokeless tobacco: Never Used  . Alcohol Use: 0.0 oz/week    0 Standard drinks or equivalent per week     Comment: occassional wine    Allergies  Allergen Reactions  . Atorvastatin     REACTION: myalgias  . Oxycodone Nausea And Vomiting  . Simvastatin     REACTION: joint and stomach pain  . Latex Rash    Local area  . Statins Other (See Comments)    Joint pain    Current  Outpatient Prescriptions  Medication Sig Dispense Refill  . acetaminophen (TYLENOL) 500 MG tablet Take 500 mg by mouth every 8 (eight) hours as needed (for pain).     Marland Kitchen aspirin 81 MG tablet Take 81 mg by mouth daily.      . Azelastine-Fluticasone 137-50 MCG/ACT SUSP Place 1 spray into the nose daily. 69 g 3  . diclofenac (VOLTAREN) 75 MG EC tablet Take 1 tablet (75 mg total) by mouth 2 (two) times daily. 60 tablet 0  . doxazosin (CARDURA) 4 MG tablet Take 1 tablet (4 mg total) by mouth at bedtime. 90 tablet 3  . esomeprazole (NEXIUM) 40 MG capsule Take 1 capsule (40 mg total) by mouth daily before breakfast. 90 capsule 3  . eszopiclone (LUNESTA) 2 MG TABS tablet Take 1 tablet (2 mg total) by mouth at bedtime. Take immediately before bedtime 90 tablet 0  . fexofenadine (ALLEGRA) 180 MG tablet Take 180 mg by mouth as needed for allergies or rhinitis.    . finasteride (PROSCAR) 5 MG tablet Take 1 tablet (5 mg total) by mouth daily. 90 tablet 3  . metroNIDAZOLE (METROGEL) 0.75 % gel Apply 1 application topically 2 (two) times daily. 45 g 11   No current facility-administered medications for this visit.      Review of Systems A complete review of systems was asked and was negative except for the following positive findingsJoint pain and stiffness, shortness of breath with exertion, recent 30 pound weight reduction which was intentional.  Blood pressure 119/75, pulse 81, temperature 96 F (35.6 C), temperature source Tympanic, weight 220 lb 10.9 oz (100.1 kg), SpO2 94 %.  Physical Exam CONSTITUTIONAL:  Pleasant, well-developed, well-nourished, and in no acute distress. EYES: Pupils equal and reactive to light, Sclera non-icteric EARS, NOSE, MOUTH AND THROAT:  The oropharynx was clear.  Dentition is good repair.  Oral mucosa pink and moist. LYMPH NODES:  Lymph nodes in the neck and axillae were normal RESPIRATORY:  Lungs were clear.  Normal respiratory effort without pathologic use of accessory  muscles of respiration CARDIOVASCULAR: Heart was regular without murmurs.  There were no carotid bruits. GI: The abdomen was soft, nontender, and nondistended. There were no palpable masses. There was no hepatosplenomegaly. There were normal bowel sounds in all quadrants. GU:  Rectal deferred.   MUSCULOSKELETAL:  Normal muscle strength and tone.  No clubbing or cyanosis.   SKIN:  There were no pathologic skin lesions.  There were no nodules on palpation. NEUROLOGIC:  Sensation is normal.  Cranial nerves are grossly intact. PSYCH:  Oriented to person, place  and time.  Mood and affect are normal.  Data Reviewed Chest x-ray and CT scan  I have personally reviewed the patient's imaging, laboratory findings and medical records.    Assessment    I have independently reviewed the patient's chest x-ray and CT scans. There is an obvious elevation of the right hemidiaphragm without other significant pathology. I see no mediastinal tumor. I see no evidence of a diaphragmatic hernia.    Plan    I explained to the patient that we would like to obtain a fluoroscopy of the diaphragms to see if there is paradoxical motion of the diaphragm. I would encourage continued weight reduction and overall improvement in his functional status. I did discuss with him briefly the surgical management of diaphragmatic plication. The best results were obtained with paradoxical motion of the diaphragm and one in the lesion is of a short duration such as less than 1-2 years. He will come back to see me again in 2 weeks.       Nestor Lewandowsky, MD 09/03/2015, 10:27 AM

## 2015-09-03 NOTE — Progress Notes (Signed)
Patient here for consult. 

## 2015-09-07 DIAGNOSIS — G4733 Obstructive sleep apnea (adult) (pediatric): Secondary | ICD-10-CM | POA: Diagnosis not present

## 2015-09-10 ENCOUNTER — Ambulatory Visit
Admission: RE | Admit: 2015-09-10 | Discharge: 2015-09-10 | Disposition: A | Discharge status: Home / Self Care | Payer: Medicare Other | Source: Ambulatory Visit | Attending: Cardiothoracic Surgery | Admitting: Cardiothoracic Surgery

## 2015-09-10 DIAGNOSIS — J986 Disorders of diaphragm: Secondary | ICD-10-CM | POA: Diagnosis not present

## 2015-09-10 DIAGNOSIS — J9811 Atelectasis: Secondary | ICD-10-CM | POA: Diagnosis not present

## 2015-09-11 ENCOUNTER — Other Ambulatory Visit: Payer: Self-pay | Admitting: Internal Medicine

## 2015-09-14 ENCOUNTER — Telehealth: Payer: Self-pay | Admitting: *Deleted

## 2015-09-14 NOTE — Telephone Encounter (Signed)
LMOM for pt to call back ion regards to ONO results.

## 2015-09-16 NOTE — Telephone Encounter (Signed)
LM on VM for pt to call in regards to results.

## 2015-09-17 ENCOUNTER — Encounter: Payer: Self-pay | Admitting: Internal Medicine

## 2015-09-17 NOTE — Telephone Encounter (Signed)
LM on VM for pt to return call back for results.

## 2015-09-18 NOTE — Telephone Encounter (Signed)
Pt informed of ONO results. Nothing further needed. 

## 2015-09-22 ENCOUNTER — Ambulatory Visit (INDEPENDENT_AMBULATORY_CARE_PROVIDER_SITE_OTHER): Payer: Medicare Other | Admitting: Cardiothoracic Surgery

## 2015-09-22 ENCOUNTER — Encounter: Payer: Self-pay | Admitting: Cardiothoracic Surgery

## 2015-09-22 VITALS — BP 143/81 | HR 71 | Temp 97.5°F | Ht 69.0 in | Wt 221.0 lb

## 2015-09-22 DIAGNOSIS — R918 Other nonspecific abnormal finding of lung field: Secondary | ICD-10-CM

## 2015-09-22 NOTE — Progress Notes (Signed)
[name] comes back in follow-up. He is a 75 year old gentleman who has had a long-standing history of right diaphragmatic paralysis. He felt unwell over the last couple years because of increasing shortness of breath but also was significantly overweight. He underwent knee surgery and try to lose weight. He's lost a significant amount of weight and his overall functional status has improved. Today he states that he feels great. In fact today states he feels better today and he felt in many years. As part of our evaluation he did have a chest x-ray made which revealed an elevated right hemidiaphragm. He underwent a sniff test which confirmed the presence of a paralyzed right hemidiaphragm with paradoxical motion during inspiration.  Today [name] states that he feels quite well overall. He is not interested in pursuing any aggressive surgical approach at this time since he is improving. I told him that his sniff test would show that there may be some benefit with surgical plication of the diaphragm. We had a long discussion about the options. At the present time he would like to continue follow-up. I did not make an appointment for but would be happy to see him should the need arise.

## 2015-09-22 NOTE — Patient Instructions (Signed)
Please give us a call if you need us.  

## 2015-09-23 ENCOUNTER — Ambulatory Visit (INDEPENDENT_AMBULATORY_CARE_PROVIDER_SITE_OTHER)
Admission: RE | Admit: 2015-09-23 | Discharge: 2015-09-23 | Disposition: A | Discharge status: Home / Self Care | Payer: Medicare Other | Source: Ambulatory Visit | Attending: Internal Medicine | Admitting: Internal Medicine

## 2015-09-23 ENCOUNTER — Ambulatory Visit (INDEPENDENT_AMBULATORY_CARE_PROVIDER_SITE_OTHER): Payer: Medicare Other | Admitting: Internal Medicine

## 2015-09-23 ENCOUNTER — Encounter: Payer: Self-pay | Admitting: Internal Medicine

## 2015-09-23 VITALS — BP 140/76 | HR 57 | Temp 97.8°F | Wt 219.0 lb

## 2015-09-23 DIAGNOSIS — M79641 Pain in right hand: Secondary | ICD-10-CM

## 2015-09-23 DIAGNOSIS — M19041 Primary osteoarthritis, right hand: Secondary | ICD-10-CM | POA: Diagnosis not present

## 2015-09-23 DIAGNOSIS — R202 Paresthesia of skin: Secondary | ICD-10-CM

## 2015-09-23 DIAGNOSIS — R200 Anesthesia of skin: Secondary | ICD-10-CM

## 2015-09-23 NOTE — Assessment & Plan Note (Signed)
Distribution and change don't suggest cervical radiculopathy No loss of strength or function Related to brachial plexus effect from injury? No need to do testing He will consider chiropractor or accupunture

## 2015-09-23 NOTE — Progress Notes (Signed)
Pre visit review using our clinic review tool, if applicable. No additional management support is needed unless otherwise documented below in the visit note. 

## 2015-09-23 NOTE — Progress Notes (Signed)
Subjective:    Patient ID: [name], male    DOB: [birth year], 75 y.o.   MRN: [id number]  HPI Having trouble with left shoulder--arm  Was mowing by pond 6 weeks ago (about) Large riding mower got caught by pond--and he tried to push and pull it out himself Eventually had to get his truck down there  Next morning, had numbness in middle 3 fingers and pain in top of shoulder on right Feels grinding there at times Will have the numbness at night still Did have follow up about the shoulder surgery--- was released Noticed shoulder soreness if he sleeps on it Persistent numbness but slight---better if he takes it easy (just flexor side)---but now 3-5th fingers ??slight weakness  Current Outpatient Prescriptions on File Prior to Visit  Medication Sig Dispense Refill  . acetaminophen (TYLENOL) 500 MG tablet Take 500 mg by mouth every 8 (eight) hours as needed (for pain).     Marland Kitchen aspirin 81 MG tablet Take 81 mg by mouth daily.      . Azelastine-Fluticasone 137-50 MCG/ACT SUSP Place 1 spray into the nose daily. 69 g 3  . diclofenac (VOLTAREN) 75 MG EC tablet Take 1 tablet (75 mg total) by mouth 2 (two) times daily. 60 tablet 0  . doxazosin (CARDURA) 4 MG tablet Take 1 tablet (4 mg total) by mouth at bedtime. 90 tablet 3  . esomeprazole (NEXIUM) 40 MG capsule Take 1 capsule (40 mg total) by mouth daily before breakfast. 90 capsule 3  . eszopiclone (LUNESTA) 2 MG TABS tablet Take 1 tablet (2 mg total) by mouth at bedtime. Take immediately before bedtime 90 tablet 0  . finasteride (PROSCAR) 5 MG tablet Take 1 tablet (5 mg total) by mouth daily. 90 tablet 3  . metroNIDAZOLE (METROGEL) 0.75 % gel Apply 1 application topically 2 (two) times daily. 45 g 11   No current facility-administered medications on file prior to visit.    Allergies  Allergen Reactions  . Atorvastatin     REACTION: myalgias  . Oxycodone Nausea And Vomiting  . Simvastatin     REACTION: joint and stomach pain  .  Latex Rash    Local area  . Statins Other (See Comments)    Joint pain    Past Medical History  Diagnosis Date  . Allergy   . Diverticulitis   . GERD (gastroesophageal reflux disease)   . Hyperlipidemia   . Anxiety   . Osteoarthritis   . Spinal stenosis   . BPH (benign prostatic hypertrophy)   . Kidney stones   . Obstructive sleep apnea     CPAP  . Heart murmur   . Sleep apnea     cpap nightly  . Osteoporosis     Past Surgical History  Procedure Laterality Date  . Tendon repair      Left knee tendon repair--1966  . Vasectomy      Vasectomy/spermatocele/?testicular cyst--1970  . Rotator cuff repair      R rotator cuff repair--1/00  Noemi Chapel)  . Eye muscle surgery      L eye muscle surgery--2/01  . Esophagogastroduodenoscopy      colon--6/04, Barrett's--7/05, Barrett's but no cancer--4/08  . Knee arthroscopy      7/ 09 Arthroscopy left knee--Dr Noemi Chapel  . Joint replacement  1/12    left TKR  . Lipoma excision Left 2010    compressing nerve by spine---[facility] Terald Sleeper at [facility]  . Rotator cuff repair Left ~2005 & 11/15  biceps repair also in 2015  . 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 Hypertension Mother   . Breast cancer Mother   . Diabetes Neg Hx   . Colon cancer Neg Hx   . Colon polyps Mother   . Coronary artery disease Mother   . Esophageal cancer Neg Hx   . Gallbladder disease Neg Hx   . Kidney disease Neg Hx     Social History   Social History  . Marital Status: Married    Spouse Name: N/A  . Number of Children: 3  . Years of Education: N/A   Occupational History  . retired    Social History Main Topics  . Smoking status: Never Smoker   . Smokeless tobacco: Never Used  . Alcohol Use: 0.0 oz/week    0 Standard drinks or equivalent per week     Comment: occassional wine  . Drug Use: No  . Sexual Activity: Not on file   Other Topics Concern  . Not on file   Social History Narrative   Lives  at home with wife      Has living will   Wife is health care POA   Would accept resuscitation attempts   No tube feeds if cognitively unaware            Review of Systems  No neck problems Still takes diclofenac for right hand pain--not really helping much anymore     Objective:   Physical Exam  Neck:  Mild stiffness in neck extension and rotation but otherwise full No local tenderness or mass  Musculoskeletal:  Left shoulder clunks with ROM. Normal abduction but some reduction in external rotation. Internal rotation normal  Neurological:  Normal strength in upper extremities          Assessment & Plan:

## 2015-09-24 ENCOUNTER — Encounter: Payer: Self-pay | Admitting: *Deleted

## 2015-09-25 ENCOUNTER — Ambulatory Visit: Payer: Self-pay | Admitting: Cardiothoracic Surgery

## 2015-10-22 ENCOUNTER — Telehealth: Payer: Self-pay

## 2015-10-22 DIAGNOSIS — R200 Anesthesia of skin: Secondary | ICD-10-CM

## 2015-10-22 DIAGNOSIS — R202 Paresthesia of skin: Principal | ICD-10-CM

## 2015-10-22 NOTE — Telephone Encounter (Signed)
Pt left v/m ; pt was seen 09/23/15; the hand issue has worsened; end of thumb is numb but also has "great pain in thumb". Xray done on 09/23/15. Pt request cb with what is next step.

## 2015-10-22 NOTE — Telephone Encounter (Signed)
Please let him know that I have sent in a referral for him to be evaluated by a neurologist. He should hear about this in the next day or 2

## 2015-10-22 NOTE — Telephone Encounter (Signed)
Spoke to pt. He appreciated the call. °

## 2015-10-29 DIAGNOSIS — M25512 Pain in left shoulder: Secondary | ICD-10-CM | POA: Diagnosis not present

## 2015-11-04 DIAGNOSIS — M25512 Pain in left shoulder: Secondary | ICD-10-CM | POA: Diagnosis not present

## 2015-11-11 DIAGNOSIS — M25512 Pain in left shoulder: Secondary | ICD-10-CM | POA: Diagnosis not present

## 2015-11-19 DIAGNOSIS — M25512 Pain in left shoulder: Secondary | ICD-10-CM | POA: Diagnosis not present

## 2015-11-30 ENCOUNTER — Ambulatory Visit: Payer: Medicare Other | Admitting: Internal Medicine

## 2015-12-10 DIAGNOSIS — M25512 Pain in left shoulder: Secondary | ICD-10-CM | POA: Diagnosis not present

## 2015-12-16 ENCOUNTER — Encounter: Payer: Self-pay | Admitting: Internal Medicine

## 2015-12-16 ENCOUNTER — Ambulatory Visit (INDEPENDENT_AMBULATORY_CARE_PROVIDER_SITE_OTHER): Payer: Medicare Other | Admitting: Internal Medicine

## 2015-12-16 VITALS — BP 140/88 | HR 52 | Temp 98.0°F | Ht 67.5 in | Wt 215.0 lb

## 2015-12-16 DIAGNOSIS — I10 Essential (primary) hypertension: Secondary | ICD-10-CM | POA: Diagnosis not present

## 2015-12-16 DIAGNOSIS — G4733 Obstructive sleep apnea (adult) (pediatric): Secondary | ICD-10-CM | POA: Diagnosis not present

## 2015-12-16 DIAGNOSIS — Z0000 Encounter for general adult medical examination without abnormal findings: Secondary | ICD-10-CM | POA: Diagnosis not present

## 2015-12-16 DIAGNOSIS — K2270 Barrett's esophagus without dysplasia: Secondary | ICD-10-CM

## 2015-12-16 DIAGNOSIS — M19041 Primary osteoarthritis, right hand: Secondary | ICD-10-CM

## 2015-12-16 DIAGNOSIS — E785 Hyperlipidemia, unspecified: Secondary | ICD-10-CM | POA: Diagnosis not present

## 2015-12-16 DIAGNOSIS — N400 Enlarged prostate without lower urinary tract symptoms: Secondary | ICD-10-CM

## 2015-12-16 DIAGNOSIS — M4806 Spinal stenosis, lumbar region: Secondary | ICD-10-CM

## 2015-12-16 DIAGNOSIS — M25512 Pain in left shoulder: Secondary | ICD-10-CM | POA: Diagnosis not present

## 2015-12-16 DIAGNOSIS — M19042 Primary osteoarthritis, left hand: Secondary | ICD-10-CM

## 2015-12-16 DIAGNOSIS — M48061 Spinal stenosis, lumbar region without neurogenic claudication: Secondary | ICD-10-CM

## 2015-12-16 LAB — LIPID PANEL
CHOLESTEROL: 239 mg/dL — AB (ref 0–200)
HDL: 45.1 mg/dL (ref >39.00)
LDL CALC: 174 mg/dL — AB (ref 0–99)
NonHDL: 194.08
TRIGLYCERIDES: 98 mg/dL (ref 0.0–149.0)
Total CHOL/HDL Ratio: 5
VLDL: 19.6 mg/dL (ref 0.0–40.0)

## 2015-12-16 LAB — CBC WITH DIFFERENTIAL/PLATELET
BASOS ABS: 0 10*3/uL (ref 0.0–0.1)
BASOS PCT: 0.5 % (ref 0.0–3.0)
EOS ABS: 0.4 10*3/uL (ref 0.0–0.7)
Eosinophils Relative: 5.5 % — ABNORMAL HIGH (ref 0.0–5.0)
HCT: 40 % (ref 39.0–52.0)
Hemoglobin: 13.6 g/dL (ref 13.0–17.0)
LYMPHS PCT: 24.7 % (ref 12.0–46.0)
Lymphs Abs: 1.7 10*3/uL (ref 0.7–4.0)
MCHC: 34 g/dL (ref 30.0–36.0)
MCV: 85.3 fl (ref 78.0–100.0)
MONO ABS: 0.5 10*3/uL (ref 0.1–1.0)
Monocytes Relative: 7.8 % (ref 3.0–12.0)
NEUTROS ABS: 4.2 10*3/uL (ref 1.4–7.7)
Neutrophils Relative %: 61.5 % (ref 43.0–77.0)
Platelets: 214 10*3/uL (ref 150.0–400.0)
RBC: 4.69 Mil/uL (ref 4.22–5.81)
RDW: 14.2 % (ref 11.5–15.5)
WBC: 6.8 10*3/uL (ref 4.0–10.5)

## 2015-12-16 LAB — COMPREHENSIVE METABOLIC PANEL
ALBUMIN: 4.4 g/dL (ref 3.5–5.2)
ALT: 17 U/L (ref 0–53)
AST: 17 U/L (ref 0–37)
Alkaline Phosphatase: 52 U/L (ref 39–117)
BILIRUBIN TOTAL: 1.1 mg/dL (ref 0.2–1.2)
BUN: 17 mg/dL (ref 6–23)
CALCIUM: 9.5 mg/dL (ref 8.4–10.5)
CHLORIDE: 102 meq/L (ref 96–112)
CO2: 31 meq/L (ref 19–32)
CREATININE: 0.91 mg/dL (ref 0.40–1.50)
GFR: 86.45 mL/min (ref >60.00)
Glucose, Bld: 94 mg/dL (ref 70–99)
Potassium: 4.3 mEq/L (ref 3.5–5.1)
SODIUM: 137 meq/L (ref 135–145)
Total Protein: 6.8 g/dL (ref 6.0–8.3)

## 2015-12-16 MED ORDER — ESZOPICLONE 2 MG PO TABS: 2.0000 mg | ORAL_TABLET | Freq: Every day | ORAL | 0 refills | Status: DC

## 2015-12-16 MED ORDER — TETANUS-DIPHTHERIA TOXOIDS TD 5-2 LFU IM INJ: 0.5000 mL | INJECTION | Freq: Once | INTRAMUSCULAR | 0 refills | Status: AC

## 2015-12-16 MED ORDER — DICLOFENAC SODIUM 1 % TD GEL: 2.0000 g | Freq: Four times a day (QID) | TRANSDERMAL | 11 refills | Status: DC | PRN

## 2015-12-16 NOTE — Assessment & Plan Note (Signed)
No symptoms Okay to try decrease to every other day with PPI

## 2015-12-16 NOTE — Progress Notes (Signed)
Pre visit review using our clinic review tool, if applicable. No additional management support is needed unless otherwise documented below in the visit note. 

## 2015-12-16 NOTE — Assessment & Plan Note (Signed)
Intolerant of statins Will just check

## 2015-12-16 NOTE — Assessment & Plan Note (Signed)
Will try topical diclofenac instead of oral

## 2015-12-16 NOTE — Assessment & Plan Note (Signed)
Some pain but overall better

## 2015-12-16 NOTE — Assessment & Plan Note (Signed)
Does great with his BiPap

## 2015-12-16 NOTE — Assessment & Plan Note (Signed)
Doing well on dual therapy

## 2015-12-16 NOTE — Assessment & Plan Note (Signed)
I have personally reviewed the Medicare Annual Wellness questionnaire and have noted 1. The patient's medical and social history 2. Their use of alcohol, tobacco or illicit drugs 3. Their current medications and supplements 4. The patient's functional ability including ADL's, fall risks, home safety risks and hearing or visual             impairment. 5. Diet and physical activities 6. Evidence for depression or mood disorders  The patients weight, height, BMI and visual acuity have been recorded in the chart I have made referrals, counseling and provided education to the patient based review of the above and I have provided the pt with a written personalized care plan for preventive services.  I have provided you with a copy of your personalized plan for preventive services. Please take the time to review along with your updated medication list.  He asks about PSA--I recommended against Colon should be considered 2021 Discussed fitness Rx for Td

## 2015-12-16 NOTE — Progress Notes (Signed)
Subjective:    Patient ID: [name], male    DOB: Feb 18, 1941, 75 y.o.   MRN: [id number]  HPI Here for Medicare wellness and follow up of chronic medical conditions Reviewed form and advanced directives Reviewed other doctors Somewhat inconsistent with exercise---discussed No alcohol or tobacco Vision is fine. Hearing is okay [place] once this year--was able to roll and avoid injury (fell off ladder) Independent with instrumental ADLs No depression or anhedonia No obvious cognitive problems  He feels great except for the right hand pain Affects him especially with fishing voltaren helps a little Still going to PT for pain from lifting lawn mower---neck mostly  Continues on nexium No heartburn on this No swallowing problems Discussed weaning  Voids okay Mild leakage--just has to be careful at the end Nocturia x 1 is stable No daytime urgency  Sleeps okay Uses the lunesta every night Has nighttime awakening and can't get back to sleep if he skips Wears the BiPap every day--all night  Has shingles on his left trunk Just itches Not painful Started 3 weeks ago  Current Outpatient Prescriptions on File Prior to Visit  Medication Sig Dispense Refill  . acetaminophen (TYLENOL) 500 MG tablet Take 500 mg by mouth every 8 (eight) hours as needed (for pain).     Marland Kitchen aspirin 81 MG tablet Take 81 mg by mouth daily.      . Azelastine-Fluticasone 137-50 MCG/ACT SUSP Place 1 spray into the nose daily. 69 g 3  . doxazosin (CARDURA) 4 MG tablet Take 1 tablet (4 mg total) by mouth at bedtime. 90 tablet 3  . esomeprazole (NEXIUM) 40 MG capsule Take 1 capsule (40 mg total) by mouth daily before breakfast. 90 capsule 3  . eszopiclone (LUNESTA) 2 MG TABS tablet Take 1 tablet (2 mg total) by mouth at bedtime. Take immediately before bedtime 90 tablet 0  . finasteride (PROSCAR) 5 MG tablet Take 1 tablet (5 mg total) by mouth daily. 90 tablet 3  . metroNIDAZOLE (METROGEL) 0.75 % gel Apply 1  application topically 2 (two) times daily. 45 g 11   No current facility-administered medications on file prior to visit.     Allergies  Allergen Reactions  . Atorvastatin     REACTION: myalgias  . Oxycodone Nausea And Vomiting  . Simvastatin     REACTION: joint and stomach pain  . Latex Rash    Local area  . Statins Other (See Comments)    Joint pain    Past Medical History:  Diagnosis Date  . Allergy   . Anxiety   . BPH (benign prostatic hypertrophy)   . Diverticulitis   . GERD (gastroesophageal reflux disease)   . Heart murmur   . Hyperlipidemia   . Kidney stones   . Obstructive sleep apnea    CPAP  . Osteoarthritis   . Osteoporosis   . Sleep apnea    cpap nightly  . Spinal stenosis     Past Surgical History:  Procedure Laterality Date  . ESOPHAGOGASTRODUODENOSCOPY     colon--6/04, Barrett's--7/05, Barrett's but no cancer--4/08  . EYE MUSCLE SURGERY     L eye muscle surgery--2/01  . JOINT REPLACEMENT  1/12   left TKR  . KNEE ARTHROSCOPY     7/ 09 Arthroscopy left knee--Dr Noemi Chapel  . LIPOMA EXCISION Left 2010   compressing nerve by spine---Dr Terald Sleeper at [facility]  . ROTATOR CUFF REPAIR     R rotator cuff repair--1/00  Noemi Chapel)  . ROTATOR CUFF REPAIR  Left ~2005 & 11/15   biceps repair also in 2015  . TENDON REPAIR     Left knee tendon repair--1966  . TOTAL KNEE ARTHROPLASTY Right 4/16  . VASECTOMY     Vasectomy/spermatocele/?testicular cyst--1970    Family History  Problem Relation Age of Onset  . Hypertension Mother   . Breast cancer Mother   . Colon polyps Mother   . Coronary artery disease Mother   . Diabetes Neg Hx   . Colon cancer Neg Hx   . Esophageal cancer Neg Hx   . Gallbladder disease Neg Hx   . Kidney disease Neg Hx     Social History   Social History  . Marital status: Married    Spouse name: N/A  . Number of children: 3  . Years of education: N/A   Occupational History  . retired Retired   Social History Main Topics  .  Smoking status: Never Smoker  . Smokeless tobacco: Never Used  . Alcohol use 0.0 oz/week     Comment: occassional wine  . Drug use: No  . Sexual activity: Not on file   Other Topics Concern  . Not on file   Social History Narrative   Lives at home with wife      Has living will   Wife is health care POA   Would accept resuscitation attempts   No tube feeds if cognitively unaware            Review of Systems Appetite is good Watching weight--fairly stable Wears seat belt Teeth okay--regular with dentist No chest pain Breathing is okay--decided not to do anything about the elevated diaphragm Has been travelling a lot--to fish. Enjoys this No dizziness or syncope No other skin issues--- does see derm (still on gel for rosacea) Does have a spot on right trunk he wants checked Bowels are fine    Objective:   Physical Exam  Constitutional: He is oriented to person, place, and time. He appears well-developed and well-nourished. No distress.  HENT:  Mouth/Throat: Oropharynx is clear and moist. No oropharyngeal exudate.  Neck: Normal range of motion. Neck supple. No thyromegaly present.  Cardiovascular: Normal rate, regular rhythm, normal heart sounds and intact distal pulses.  Exam reveals no gallop.   No murmur heard. Pulmonary/Chest: Effort normal and breath sounds normal. No respiratory distress. He has no wheezes. He has no rales.  Abdominal: Soft. There is no tenderness.  Musculoskeletal: He exhibits no edema.  Lymphadenopathy:    He has no cervical adenopathy.  Neurological: He is alert and oriented to person, place, and time.  President--- "[place], [place]" [phone] [id number] Recall 2/3  Skin:  Non specific papular eruption left anterior trunk ~L1 Multiple keratoses and vascular lesions  Psychiatric: He has a normal mood and affect. His behavior is normal.          Assessment & Plan:

## 2015-12-16 NOTE — Assessment & Plan Note (Signed)
BP Readings from Last 3 Encounters:  12/16/15 140/88  09/23/15 140/76  09/22/15 (!) 143/81   Good control No change needed

## 2015-12-21 ENCOUNTER — Telehealth: Payer: Self-pay | Admitting: Internal Medicine

## 2015-12-21 ENCOUNTER — Other Ambulatory Visit: Payer: Self-pay | Admitting: Internal Medicine

## 2015-12-21 MED ORDER — DICLOFENAC SODIUM 75 MG PO TBEC: 75.0000 mg | DELAYED_RELEASE_TABLET | Freq: Two times a day (BID) | ORAL | 1 refills | Status: DC | PRN

## 2015-12-21 MED ORDER — ESOMEPRAZOLE MAGNESIUM 40 MG PO CPDR: 40.0000 mg | DELAYED_RELEASE_CAPSULE | Freq: Every day | ORAL | 3 refills | Status: DC

## 2015-12-21 NOTE — Telephone Encounter (Signed)
Pt called and stated that the topical pain med did not work and would like to go back on the pain pills.  He is also requesting a refill on his generic nexium at 40mg .  Can you please refill this for him.

## 2015-12-21 NOTE — Telephone Encounter (Signed)
Spoke to pt

## 2015-12-23 ENCOUNTER — Encounter: Payer: Self-pay | Admitting: Internal Medicine

## 2015-12-23 DIAGNOSIS — M25512 Pain in left shoulder: Secondary | ICD-10-CM | POA: Diagnosis not present

## 2015-12-24 ENCOUNTER — Ambulatory Visit (INDEPENDENT_AMBULATORY_CARE_PROVIDER_SITE_OTHER): Payer: Medicare Other | Admitting: Internal Medicine

## 2015-12-24 ENCOUNTER — Encounter: Payer: Self-pay | Admitting: Internal Medicine

## 2015-12-24 VITALS — BP 118/62 | HR 81 | Wt 218.0 lb

## 2015-12-24 DIAGNOSIS — G4730 Sleep apnea, unspecified: Secondary | ICD-10-CM | POA: Diagnosis not present

## 2015-12-24 NOTE — Patient Instructions (Signed)
Obtain Compliance report Continue nasal sprays Continue BiPAP at night   Sleep Apnea  Sleep apnea is a sleep disorder characterized by abnormal pauses in breathing while you sleep. When your breathing pauses, the level of oxygen in your blood decreases. This causes you to move out of deep sleep and into light sleep. As a result, your quality of sleep is poor, and the system that carries your blood throughout your body (cardiovascular system) experiences stress. If sleep apnea remains untreated, the following conditions can develop:  High blood pressure (hypertension).  Coronary artery disease.  Inability to achieve or maintain an erection (impotence).  Impairment of your thought process (cognitive dysfunction). There are three types of sleep apnea: 1. Obstructive sleep apnea--Pauses in breathing during sleep because of a blocked airway. 2. Central sleep apnea--Pauses in breathing during sleep because the area of the brain that controls your breathing does not send the correct signals to the muscles that control breathing. 3. Mixed sleep apnea--A combination of both obstructive and central sleep apnea. RISK FACTORS The following risk factors can increase your risk of developing sleep apnea:  Being overweight.  Smoking.  Having narrow passages in your nose and throat.  Being of older age.  Being male.  Alcohol use.  Sedative and tranquilizer use.  Ethnicity. Among individuals younger than 35 years, African Americans are at increased risk of sleep apnea. SYMPTOMS   Difficulty staying asleep.  Daytime sleepiness and fatigue.  Loss of energy.  Irritability.  Loud, heavy snoring.  Morning headaches.  Trouble concentrating.  Forgetfulness.  Decreased interest in sex.  Unexplained sleepiness. DIAGNOSIS  In order to diagnose sleep apnea, your caregiver will perform a physical examination. A sleep study done in the comfort of your own home may be appropriate if you  are otherwise healthy. Your caregiver may also recommend that you spend the night in a sleep lab. In the sleep lab, several monitors record information about your heart, lungs, and brain while you sleep. Your leg and arm movements and blood oxygen level are also recorded. TREATMENT The following actions may help to resolve mild sleep apnea:  Sleeping on your side.   Using a decongestant if you have nasal congestion.   Avoiding the use of depressants, including alcohol, sedatives, and narcotics.   Losing weight and modifying your diet if you are overweight. There also are devices and treatments to help open your airway:  Oral appliances. These are custom-made mouthpieces that shift your lower jaw forward and slightly open your bite. This opens your airway.  Devices that create positive airway pressure. This positive pressure "splints" your airway open to help you breathe better during sleep. The following devices create positive airway pressure:  Continuous positive airway pressure (CPAP) device. The CPAP device creates a continuous level of air pressure with an air pump. The air is delivered to your airway through a mask while you sleep. This continuous pressure keeps your airway open.  Nasal expiratory positive airway pressure (EPAP) device. The EPAP device creates positive air pressure as you exhale. The device consists of single-use valves, which are inserted into each nostril and held in place by adhesive. The valves create very little resistance when you inhale but create much more resistance when you exhale. That increased resistance creates the positive airway pressure. This positive pressure while you exhale keeps your airway open, making it easier to breath when you inhale again.  Bilevel positive airway pressure (BPAP) device. The BPAP device is used mainly in patients with  central sleep apnea. This device is similar to the CPAP device because it also uses an air pump to deliver  continuous air pressure through a mask. However, with the BPAP machine, the pressure is set at two different levels. The pressure when you exhale is lower than the pressure when you inhale.  Surgery. Typically, surgery is only done if you cannot comply with less invasive treatments or if the less invasive treatments do not improve your condition. Surgery involves removing excess tissue in your airway to create a wider passage way.   This information is not intended to replace advice given to you by your health care provider. Make sure you discuss any questions you have with your health care provider.   Document Released: 04/15/2002 Document Revised: 05/16/2014 Document Reviewed: 09/01/2011 Elsevier Interactive Patient Education [employer].

## 2015-12-24 NOTE — Progress Notes (Signed)
[name]      Date: 12/24/2015,   MRN# [id number] [name] 07-03-1940 Code Status:  Hosp day:@LENGTHOFSTAYDAYS @ Referring MD: @ATDPROV @     PCP:      AdmissionWeight: 218 lb (98.9 kg)                 CurrentWeight: 218 lb (98.9 kg) [name] is a 75 y.o. old male seen in [place] for chronic SOB     CHIEF COMPLAINT:   Chronic SOB   HISTORY OF PRESENT ILLNESS  75 yo white male seen today for follow up chronic SOB, patient with a dx of chronic RT sided elevated hemidiaphragm  patient doing well overall,  No SOb, walks 3 miles per week, no DOE, lost approx 30 pounds in last 3 months on diet and exercise regimen Initial weight was 247 down to 211 No signs of infection at this time  PFT 05/29/15 fvc 52% fev1 60%, ratio 83% DLCO 66%  findings  suggest moderate restrictive lung disease BiPAP reports: 100% compliance at last visit 6MWT WNL  Saw Dr. Genevive Bi for elevated diaphragm-no further intervention at this time   BIPAP   MEDICATIONS    Current Medication:  Current Outpatient Prescriptions:  .  acetaminophen (TYLENOL) 500 MG tablet, Take 500 mg by mouth every 8 (eight) hours as needed (for pain). , Disp: , Rfl:  .  aspirin 81 MG tablet, Take 81 mg by mouth daily.  , Disp: , Rfl:  .  Azelastine-Fluticasone 137-50 MCG/ACT SUSP, Place 1 spray into the nose daily., Disp: 69 g, Rfl: 3 .  diclofenac (VOLTAREN) 75 MG EC tablet, Take 1 tablet (75 mg total) by mouth 2 (two) times daily as needed., Disp: 60 tablet, Rfl: 1 .  doxazosin (CARDURA) 4 MG tablet, Take 1 tablet (4 mg total) by mouth at bedtime., Disp: 90 tablet, Rfl: 3 .  esomeprazole (NEXIUM) 40 MG capsule, Take 1 capsule (40 mg total) by mouth daily before breakfast., Disp: 90 capsule, Rfl: 3 .  eszopiclone (LUNESTA) 2 MG TABS tablet, Take 1 tablet (2 mg total) by mouth at bedtime. Take immediately before bedtime, Disp: 90 tablet, Rfl: 0 .  finasteride (PROSCAR)  5 MG tablet, Take 1 tablet (5 mg total) by mouth daily., Disp: 90 tablet, Rfl: 3 .  metroNIDAZOLE (METROGEL) 0.75 % gel, Apply 1 application topically 2 (two) times daily., Disp: 45 g, Rfl: 11    ALLERGIES   Atorvastatin; Oxycodone; Simvastatin; Latex; and Statins     REVIEW OF SYSTEMS   Review of Systems  Constitutional: Negative for chills, fever and weight loss.  Eyes: Negative.   Respiratory: Negative for cough, hemoptysis, sputum production, shortness of breath and wheezing.   Cardiovascular: Negative for chest pain, orthopnea and leg swelling.  Neurological: Negative for dizziness.  All other systems reviewed and are negative.    VS: BP 118/62 (BP Location: Left Arm, Cuff Size: Normal)   Pulse 81   Wt 218 lb (98.9 kg)   SpO2 96%   BMI 33.64 kg/m      PHYSICAL EXAM  Physical Exam  Constitutional: He is oriented to person, place, and time. He appears well-developed and well-nourished. No distress.  Neck: Neck supple.  Cardiovascular: Normal rate, regular rhythm and normal heart sounds.   No murmur heard. Pulmonary/Chest: Effort normal and breath sounds normal. No respiratory distress. He has no wheezes.  Musculoskeletal: He exhibits no edema.  Neurological: He is alert and oriented to person, place,  and time.  Skin: He is not diaphoretic.  Psychiatric: He has a normal mood and affect.     ASSESSMENT/PLAN   75 yo white male with chronic SOB with DOE with chronic elevated hemidiaphragm etiology is unknown, with underlying OSA  1.albuterol as needed- 2.CT chest:shows RLL lung atalectasis-on biPAP at night for OSA -will need to obtain compliance  3.follow up with Dr Ashby Dawes for sleep disorders as needed 4.overnight pulse oximetry-no oxygen needed at this time 5.referral to Dr. Genevive Bi to evaluate elevated hemidiaphragm-no intervention at this time  Follow up in 6 months   The Patient requires high complexity decision making for assessment and support,  frequent evaluation and titration of therapies, application of advanced monitoring technologies and extensive interpretation of multiple databases.  Patient satisfied with Plan of action and management. All questions answered  [name], M.D.  [facility]  Medical Director [place] Director [facility] Cardio-Pulmonary Department

## 2016-01-06 DIAGNOSIS — M25512 Pain in left shoulder: Secondary | ICD-10-CM | POA: Diagnosis not present

## 2016-01-15 DIAGNOSIS — H2513 Age-related nuclear cataract, bilateral: Secondary | ICD-10-CM | POA: Diagnosis not present

## 2016-01-20 DIAGNOSIS — M79675 Pain in left toe(s): Secondary | ICD-10-CM | POA: Diagnosis not present

## 2016-01-20 DIAGNOSIS — M25512 Pain in left shoulder: Secondary | ICD-10-CM | POA: Diagnosis not present

## 2016-01-20 DIAGNOSIS — B351 Tinea unguium: Secondary | ICD-10-CM | POA: Diagnosis not present

## 2016-01-20 DIAGNOSIS — M79674 Pain in right toe(s): Secondary | ICD-10-CM | POA: Diagnosis not present

## 2016-02-03 DIAGNOSIS — M25512 Pain in left shoulder: Secondary | ICD-10-CM | POA: Diagnosis not present

## 2016-02-16 ENCOUNTER — Other Ambulatory Visit: Payer: Self-pay | Admitting: Internal Medicine

## 2016-02-17 DIAGNOSIS — M25512 Pain in left shoulder: Secondary | ICD-10-CM | POA: Diagnosis not present

## 2016-03-01 DIAGNOSIS — M75122 Complete rotator cuff tear or rupture of left shoulder, not specified as traumatic: Secondary | ICD-10-CM | POA: Diagnosis not present

## 2016-03-03 ENCOUNTER — Other Ambulatory Visit: Payer: Self-pay | Admitting: Orthopedic Surgery

## 2016-03-03 DIAGNOSIS — M75122 Complete rotator cuff tear or rupture of left shoulder, not specified as traumatic: Secondary | ICD-10-CM

## 2016-03-12 ENCOUNTER — Other Ambulatory Visit: Payer: Self-pay | Admitting: Internal Medicine

## 2016-03-17 ENCOUNTER — Ambulatory Visit
Admission: RE | Admit: 2016-03-17 | Discharge: 2016-03-17 | Disposition: A | Discharge status: Home / Self Care | Payer: Medicare Other | Source: Ambulatory Visit | Attending: Orthopedic Surgery | Admitting: Orthopedic Surgery

## 2016-03-17 DIAGNOSIS — M7552 Bursitis of left shoulder: Secondary | ICD-10-CM | POA: Diagnosis not present

## 2016-03-17 DIAGNOSIS — M7582 Other shoulder lesions, left shoulder: Secondary | ICD-10-CM | POA: Diagnosis not present

## 2016-03-17 DIAGNOSIS — M19012 Primary osteoarthritis, left shoulder: Secondary | ICD-10-CM | POA: Diagnosis not present

## 2016-03-17 DIAGNOSIS — Z9889 Other specified postprocedural states: Secondary | ICD-10-CM | POA: Diagnosis not present

## 2016-03-17 DIAGNOSIS — M25512 Pain in left shoulder: Secondary | ICD-10-CM | POA: Diagnosis not present

## 2016-03-17 DIAGNOSIS — M75122 Complete rotator cuff tear or rupture of left shoulder, not specified as traumatic: Secondary | ICD-10-CM

## 2016-03-17 DIAGNOSIS — M75102 Unspecified rotator cuff tear or rupture of left shoulder, not specified as traumatic: Secondary | ICD-10-CM | POA: Diagnosis not present

## 2016-03-21 ENCOUNTER — Other Ambulatory Visit: Payer: Self-pay

## 2016-03-21 MED ORDER — ESZOPICLONE 2 MG PO TABS: 2.0000 mg | ORAL_TABLET | Freq: Every day | ORAL | 0 refills | Status: DC

## 2016-03-21 NOTE — Telephone Encounter (Signed)
Last filled 12-16-15 #90 Last OV 12-16-15 No Future OV   Forwarding to Dr Danise Mina in Dr Alla German absence

## 2016-03-21 NOTE — Telephone Encounter (Signed)
plz phone in. 

## 2016-03-21 NOTE — Telephone Encounter (Signed)
Verbal refill given to [facility] at the pharmacy

## 2016-03-29 DIAGNOSIS — M75122 Complete rotator cuff tear or rupture of left shoulder, not specified as traumatic: Secondary | ICD-10-CM | POA: Diagnosis not present

## 2016-04-14 ENCOUNTER — Ambulatory Visit: Payer: Medicare Other | Admitting: Family Medicine

## 2016-04-15 ENCOUNTER — Ambulatory Visit: Payer: Medicare Other | Admitting: Primary Care

## 2016-06-07 DIAGNOSIS — M75122 Complete rotator cuff tear or rupture of left shoulder, not specified as traumatic: Secondary | ICD-10-CM | POA: Diagnosis not present

## 2016-06-11 ENCOUNTER — Other Ambulatory Visit: Payer: Self-pay | Admitting: Internal Medicine

## 2016-06-13 ENCOUNTER — Other Ambulatory Visit: Payer: Self-pay

## 2016-06-13 MED ORDER — ESZOPICLONE 2 MG PO TABS: 2.0000 mg | ORAL_TABLET | Freq: Every day | ORAL | 0 refills | Status: DC

## 2016-06-13 NOTE — Telephone Encounter (Deleted)
Left refill on voice mail at pharmacy  

## 2016-06-13 NOTE — Telephone Encounter (Signed)
Approved: #90 x 0 

## 2016-06-13 NOTE — Telephone Encounter (Signed)
Last filled 03-21-16 #90 Last OV 12-16-15 No Future OV

## 2016-06-13 NOTE — Telephone Encounter (Signed)
Verbal refill given to pharmacist

## 2016-07-13 DIAGNOSIS — B351 Tinea unguium: Secondary | ICD-10-CM | POA: Diagnosis not present

## 2016-07-13 DIAGNOSIS — M79675 Pain in left toe(s): Secondary | ICD-10-CM | POA: Diagnosis not present

## 2016-07-13 DIAGNOSIS — M79674 Pain in right toe(s): Secondary | ICD-10-CM | POA: Diagnosis not present

## 2016-07-18 ENCOUNTER — Ambulatory Visit (INDEPENDENT_AMBULATORY_CARE_PROVIDER_SITE_OTHER): Payer: Medicare Other | Admitting: Primary Care

## 2016-07-18 ENCOUNTER — Encounter: Payer: Self-pay | Admitting: Primary Care

## 2016-07-18 ENCOUNTER — Encounter (INDEPENDENT_AMBULATORY_CARE_PROVIDER_SITE_OTHER): Payer: Self-pay

## 2016-07-18 VITALS — BP 122/70 | HR 69 | Temp 98.2°F | Ht 67.5 in | Wt 226.8 lb

## 2016-07-18 DIAGNOSIS — J019 Acute sinusitis, unspecified: Secondary | ICD-10-CM | POA: Diagnosis not present

## 2016-07-18 MED ORDER — AMOXICILLIN-POT CLAVULANATE 875-125 MG PO TABS: 1.0000 | ORAL_TABLET | Freq: Two times a day (BID) | ORAL | 0 refills | Status: DC

## 2016-07-18 NOTE — Progress Notes (Signed)
Pre visit review using our clinic review tool, if applicable. No additional management support is needed unless otherwise documented below in the visit note. 

## 2016-07-18 NOTE — Progress Notes (Signed)
Subjective:    Patient ID: [name], male    DOB: 1941-05-07, 76 y.o.   MRN: [id number]  HPI  [name] is a 76 year old male with a history of allergic rhinitis, OSA, Jerrye Bushy who presents today with a chief complaint of sinus pressure. He also reports ear pain, headache, cough, nasal congestion. His symptoms began 5 days ago. His cough is productive with green sputum and he's blowing thick, green sputum from his nasal cavity. He has a history of sinus infections that occur twice annually and this feels similar. He's been applying essential oils without improvement. He had a low grade fever over the weekend.   Review of Systems  Constitutional: Positive for chills and fever.  HENT: Positive for congestion, ear pain and sinus pressure. Negative for sore throat.   Respiratory: Positive for cough. Negative for shortness of breath.   Cardiovascular: Negative for chest pain.       Past Medical History:  Diagnosis Date  . Allergy   . Anxiety   . BPH (benign prostatic hypertrophy)   . Diverticulitis   . GERD (gastroesophageal reflux disease)   . Heart murmur   . Hyperlipidemia   . Kidney stones   . Obstructive sleep apnea    CPAP  . Osteoarthritis   . Osteoporosis   . Sleep apnea    cpap nightly  . Spinal stenosis      Social History   Social History  . Marital status: Married    Spouse name: N/A  . Number of children: 3  . Years of education: N/A   Occupational History  . retired Retired   Social History Main Topics  . Smoking status: Never Smoker  . Smokeless tobacco: Never Used  . Alcohol use 0.0 oz/week     Comment: occassional wine  . Drug use: No  . Sexual activity: Not on file   Other Topics Concern  . Not on file   Social History Narrative   Lives at home with wife      Has living will   Wife is health care POA   Would accept resuscitation attempts   No tube feeds if cognitively unaware             Past Surgical History:  Procedure  Laterality Date  . ESOPHAGOGASTRODUODENOSCOPY     colon--6/04, Barrett's--7/05, Barrett's but no cancer--4/08  . EYE MUSCLE SURGERY     L eye muscle surgery--2/01  . JOINT REPLACEMENT  1/12   left TKR  . KNEE ARTHROSCOPY     7/ 09 Arthroscopy left knee--Dr Noemi Chapel  . LIPOMA EXCISION Left 2010   compressing nerve by spine---Dr Terald Sleeper at [facility]  . ROTATOR CUFF REPAIR     R rotator cuff repair--1/00  Noemi Chapel)  . ROTATOR CUFF REPAIR Left ~2005 & 11/15   biceps repair also in 2015  . TENDON REPAIR     Left knee tendon repair--1966  . TOTAL KNEE ARTHROPLASTY Right 4/16  . VASECTOMY     Vasectomy/spermatocele/?testicular cyst--1970    Family History  Problem Relation Age of Onset  . Hypertension Mother   . Breast cancer Mother   . Colon polyps Mother   . Coronary artery disease Mother   . Diabetes Neg Hx   . Colon cancer Neg Hx   . Esophageal cancer Neg Hx   . Gallbladder disease Neg Hx   . Kidney disease Neg Hx     Allergies  Allergen Reactions  . Atorvastatin  REACTION: myalgias  . Oxycodone Nausea And Vomiting  . Simvastatin     REACTION: joint and stomach pain  . Latex Rash    Local area  . Statins Other (See Comments)    Joint pain    Current Outpatient Prescriptions on File Prior to Visit  Medication Sig Dispense Refill  . acetaminophen (TYLENOL) 500 MG tablet Take 500 mg by mouth every 8 (eight) hours as needed (for pain).     Marland Kitchen aspirin 81 MG tablet Take 81 mg by mouth daily.      . Azelastine-Fluticasone 137-50 MCG/ACT SUSP Place 1 spray into the nose daily. 69 g 3  . diclofenac (VOLTAREN) 75 MG EC tablet TAKE ONE TABLET TWICE DAILY 60 tablet 5  . doxazosin (CARDURA) 4 MG tablet TAKE 1 TABLET AT BEDTIME 90 tablet 2  . esomeprazole (NEXIUM) 40 MG capsule Take 1 capsule (40 mg total) by mouth daily before breakfast. 90 capsule 3  . eszopiclone (LUNESTA) 2 MG TABS tablet Take 1 tablet (2 mg total) by mouth at bedtime. Take immediately before bedtime 90 tablet 0    . finasteride (PROSCAR) 5 MG tablet Take 1 tablet (5 mg total) by mouth daily. 90 tablet 3  . metroNIDAZOLE (METROGEL) 0.75 % gel Apply 1 application topically 2 (two) times daily. 45 g 11   No current facility-administered medications on file prior to visit.     BP 122/70   Pulse 69   Temp 98.2 F (36.8 C) (Oral)   Ht 5' 7.5" (1.715 m)   Wt 226 lb 12.8 oz (102.9 kg)   SpO2 95%   BMI 35.00 kg/m    Objective:   Physical Exam  Constitutional: He appears well-nourished.  HENT:  Right Ear: External ear normal.  Left Ear: External ear normal.  Nose: Mucosal edema present. Right sinus exhibits maxillary sinus tenderness. Right sinus exhibits no frontal sinus tenderness. Left sinus exhibits maxillary sinus tenderness. Left sinus exhibits no frontal sinus tenderness.  Mouth/Throat: Oropharynx is clear and moist.  Eyes: Conjunctivae are normal.  Neck: Neck supple.  Cardiovascular: Normal rate and regular rhythm.   Pulmonary/Chest: Effort normal and breath sounds normal. He has no wheezes. He has no rales.  Skin: Skin is warm and dry.          Assessment & Plan:  Acute Sinusitis:  Symptoms present for 5 days, feels similar to semi-annual sinusitis. Exam today with maxillary sinus tenderness. Lungs with mild rhonchi throughout, appears ill not sickly. Given presence of purulent mucous from chest and nasal cavity and lung sounds, will treat.  Rx for Augmentin course sent to pharmacy. Discussed Flonase and Mucinex. Fluids, rest, follow up PRN.  Sheral Flow, NP

## 2016-07-18 NOTE — Patient Instructions (Addendum)
Start Augmentin antibiotics for sinus infection and early chest infection. Take 1 tablet by mouth twice daily for 7 days.  Continue essential oils.  Cough/Congestion: Try taking Mucinex DM. This will help loosen up the mucous in your chest. Ensure you take this medication with a full glass of water.  Ensure you are staying hydrated and rest.  It was a pleasure meeting you!

## 2016-07-25 ENCOUNTER — Ambulatory Visit (INDEPENDENT_AMBULATORY_CARE_PROVIDER_SITE_OTHER)
Admission: RE | Admit: 2016-07-25 | Discharge: 2016-07-25 | Disposition: A | Discharge status: Home / Self Care | Payer: Medicare Other | Source: Ambulatory Visit | Attending: Internal Medicine | Admitting: Internal Medicine

## 2016-07-25 ENCOUNTER — Ambulatory Visit (INDEPENDENT_AMBULATORY_CARE_PROVIDER_SITE_OTHER): Payer: Medicare Other | Admitting: Internal Medicine

## 2016-07-25 ENCOUNTER — Encounter: Payer: Self-pay | Admitting: Internal Medicine

## 2016-07-25 VITALS — BP 144/76 | HR 62 | Temp 98.4°F | Wt 226.5 lb

## 2016-07-25 DIAGNOSIS — R0602 Shortness of breath: Secondary | ICD-10-CM | POA: Insufficient documentation

## 2016-07-25 NOTE — Assessment & Plan Note (Addendum)
May just be remnants from sinus infection No unexpected lung findings but will check CXR CXR looks normal for him Reassured--may just take a while for his energy to return Slow return to the gym

## 2016-07-25 NOTE — Progress Notes (Signed)
Pre visit review using our clinic review tool, if applicable. No additional management support is needed unless otherwise documented below in the visit note. 

## 2016-07-25 NOTE — Progress Notes (Signed)
Subjective:    Patient ID: [name], male    DOB: [birth year], 76 y.o.   MRN: [id number]  HPI Here for follow up after last visit  Feels better--stronger (but tires quickly--like by noon) Tried to go to gym---had to quit early and go to sleep  Concerned about the finding of "fluid in lungs" Feels shot of breath with illness Fever yesterday--missed church  Finished augmentin occ cough with clear sputum (yellow and green till about 4 days ago) No sig head congestion  Current Outpatient Prescriptions on File Prior to Visit  Medication Sig Dispense Refill  . acetaminophen (TYLENOL) 500 MG tablet Take 500 mg by mouth every 8 (eight) hours as needed (for pain).     Marland Kitchen aspirin 81 MG tablet Take 81 mg by mouth daily.      . Azelastine-Fluticasone 137-50 MCG/ACT SUSP Place 1 spray into the nose daily. 69 g 3  . diclofenac (VOLTAREN) 75 MG EC tablet TAKE ONE TABLET TWICE DAILY 60 tablet 5  . doxazosin (CARDURA) 4 MG tablet TAKE 1 TABLET AT BEDTIME 90 tablet 2  . esomeprazole (NEXIUM) 40 MG capsule Take 1 capsule (40 mg total) by mouth daily before breakfast. 90 capsule 3  . eszopiclone (LUNESTA) 2 MG TABS tablet Take 1 tablet (2 mg total) by mouth at bedtime. Take immediately before bedtime 90 tablet 0  . finasteride (PROSCAR) 5 MG tablet Take 1 tablet (5 mg total) by mouth daily. 90 tablet 3  . metroNIDAZOLE (METROGEL) 0.75 % gel Apply 1 application topically 2 (two) times daily. 45 g 11   No current facility-administered medications on file prior to visit.     Allergies  Allergen Reactions  . Atorvastatin     REACTION: myalgias  . Oxycodone Nausea And Vomiting  . Simvastatin     REACTION: joint and stomach pain  . Latex Rash    Local area  . Statins Other (See Comments)    Joint pain    Past Medical History:  Diagnosis Date  . Allergy   . Anxiety   . BPH (benign prostatic hypertrophy)   . Diverticulitis   . GERD (gastroesophageal reflux disease)   . Heart murmur     . Hyperlipidemia   . Kidney stones   . Obstructive sleep apnea    CPAP  . Osteoarthritis   . Osteoporosis   . Sleep apnea    cpap nightly  . Spinal stenosis     Past Surgical History:  Procedure Laterality Date  . ESOPHAGOGASTRODUODENOSCOPY     colon--6/04, Barrett's--7/05, Barrett's but no cancer--4/08  . EYE MUSCLE SURGERY     L eye muscle surgery--2/01  . JOINT REPLACEMENT  1/12   left TKR  . KNEE ARTHROSCOPY     7/ 09 Arthroscopy left knee--Dr Noemi Chapel  . LIPOMA EXCISION Left 2010   compressing nerve by spine---[facility] Terald [facility]leeper at [facility]  . ROTATOR CUFF REPAIR     R rotator cuff repair--1/00  Noemi Chapel)  . ROTATOR CUFF REPAIR Left ~2005 & 11/15   biceps repair also in 2015  . TENDON REPAIR     Left knee tendon repair--1966  . TOTAL KNEE ARTHROPLASTY Right 4/16  . VASECTOMY     Vasectomy/spermatocele/?testicular cyst--1970    Family History  Problem Relation Age of Onset  . Hypertension Mother   . Breast cancer Mother   . Colon polyps Mother   . Coronary artery disease Mother   . Diabetes Neg Hx   . Colon cancer Neg  Hx   . Esophageal cancer Neg Hx   . Gallbladder disease Neg Hx   . Kidney disease Neg Hx     Social History   Social History  . Marital status: Married    Spouse name: N/A  . Number of children: 3  . Years of education: N/A   Occupational History  . retired Retired   Social History Main Topics  . Smoking status: Never Smoker  . Smokeless tobacco: Never Used  . Alcohol use 0.0 oz/week     Comment: occassional wine  . Drug use: No  . Sexual activity: Not on file   Other Topics Concern  . Not on file   Social History Narrative   Lives at home with wife      Has living will   Wife is health care POA   Would accept resuscitation attempts   No tube feeds if cognitively unaware            Review of Systems  No nausea or vomiting No diarrhea  Appetite is still off     Objective:   Physical Exam  Constitutional: He appears  well-nourished. No distress.  HENT:  Mouth/Throat: Oropharynx is clear and moist. No oropharyngeal exudate.  No sinus tenderness  Cardiovascular: Normal rate and regular rhythm.  Exam reveals no gallop.   Gr 2/6 systolic murmur best at ULSB  Pulmonary/Chest:  Dullness and decreased breath sounds at right base (known elevated hemidiaphragm)          Assessment & Plan:

## 2016-08-11 ENCOUNTER — Other Ambulatory Visit: Payer: Self-pay | Admitting: Internal Medicine

## 2016-08-18 DIAGNOSIS — H900 Conductive hearing loss, bilateral: Secondary | ICD-10-CM | POA: Diagnosis not present

## 2016-08-18 DIAGNOSIS — H6123 Impacted cerumen, bilateral: Secondary | ICD-10-CM | POA: Diagnosis not present

## 2016-09-09 ENCOUNTER — Other Ambulatory Visit: Payer: Self-pay | Admitting: Internal Medicine

## 2016-09-10 ENCOUNTER — Other Ambulatory Visit: Payer: Self-pay | Admitting: Internal Medicine

## 2016-09-12 ENCOUNTER — Other Ambulatory Visit: Payer: Self-pay | Admitting: Internal Medicine

## 2016-09-12 NOTE — Telephone Encounter (Signed)
Approved: #90 x 0 

## 2016-09-12 NOTE — Telephone Encounter (Signed)
Rx called to pharmacy as instructed. 

## 2016-09-12 NOTE — Telephone Encounter (Signed)
Received refill electronically Last refill 06/13/16 Last office visit 07/25/16

## 2016-09-12 NOTE — Telephone Encounter (Signed)
Last filled 06-13-16 #90 Last CPE 12-16-15 Acute 07-25-16 Next OV 12-28-16

## 2016-09-13 DIAGNOSIS — M75122 Complete rotator cuff tear or rupture of left shoulder, not specified as traumatic: Secondary | ICD-10-CM | POA: Diagnosis not present

## 2016-09-13 NOTE — Telephone Encounter (Signed)
Verbal refill given to Levada Dy at the pharmacy

## 2016-09-13 NOTE — Telephone Encounter (Signed)
Approved: #90 x 0 

## 2016-09-15 ENCOUNTER — Other Ambulatory Visit: Payer: Self-pay | Admitting: Internal Medicine

## 2016-09-15 ENCOUNTER — Encounter: Payer: Self-pay | Admitting: Internal Medicine

## 2016-09-20 ENCOUNTER — Ambulatory Visit (INDEPENDENT_AMBULATORY_CARE_PROVIDER_SITE_OTHER): Payer: Medicare Other | Admitting: Internal Medicine

## 2016-09-20 ENCOUNTER — Encounter: Payer: Self-pay | Admitting: Internal Medicine

## 2016-09-20 VITALS — BP 134/80 | HR 68 | Resp 16 | Ht 67.5 in | Wt 217.0 lb

## 2016-09-20 DIAGNOSIS — G4733 Obstructive sleep apnea (adult) (pediatric): Secondary | ICD-10-CM

## 2016-09-20 MED ORDER — AMBULATORY NON FORMULARY MEDICATION: 0 refills | Status: DC

## 2016-09-20 NOTE — Progress Notes (Signed)
[facility] Consultation      Date: 09/20/2016,   MRN# [id number] ALDINE GRAINGER 19-Jan-1941 Code Status:  Hosp day:@LENGTHOFSTAYDAYS @ Referring MD: @ATDPROV @     PCP:      AdmissionWeight: 217 lb (98.4 kg)                 CurrentWeight: 217 lb (98.4 kg) KEO SCHIRMER is a 76 y.o. old male seen in consultation for chronic SOB     CHIEF COMPLAINT:   Follow up Chronic SOB   HISTORY OF PRESENT ILLNESS   76 yo white male seen today for follow up chronic SOB, patient with a dx of chronic RT sided elevated hemidiaphragm  patient doing well overall,  No SOb, walks 3 miles per week, no DOE, lost approx 30 pounds in last 3 months on diet and exercise regimen Initial weight was 247 down to 214 No signs of infection at this time  PFT 05/29/15 fvc 52% fev1 60%, ratio 83% DLCO 66%  findings  suggest moderate restrictive lung disease BiPAP reports: 100% compliance at last visit 6MWT WNL  Saw Dr. Genevive Bi for elevated diaphragm-no further intervention at this time  BIPAP   MEDICATIONS    Current Medication:  Current Outpatient Prescriptions:  .  acetaminophen (TYLENOL) 500 MG tablet, Take 500 mg by mouth every 8 (eight) hours as needed (for pain). , Disp: , Rfl:  .  aspirin 81 MG tablet, Take 81 mg by mouth daily.  , Disp: , Rfl:  .  Azelastine-Fluticasone 137-50 MCG/ACT SUSP, Place 1 spray into the nose daily., Disp: 69 g, Rfl: 3 .  diclofenac (VOLTAREN) 75 MG EC tablet, TAKE ONE TABLET TWICE DAILY, Disp: 60 tablet, Rfl: 3 .  doxazosin (CARDURA) 4 MG tablet, TAKE 1 TABLET AT BEDTIME, Disp: 90 tablet, Rfl: 2 .  esomeprazole (NEXIUM) 40 MG capsule, TAKE 1 CAPSULE DAILY BEFOREBREAKFAST, Disp: 90 capsule, Rfl: 3 .  eszopiclone (LUNESTA) 2 MG TABS tablet, TAKE ONE TABLET BY MOUTH AT BEDTIME, Disp: 90 tablet, Rfl: 0 .  finasteride (PROSCAR) 5 MG tablet, TAKE 1 TABLET DAILY, Disp: 90 tablet, Rfl: 1 .  metroNIDAZOLE (METROGEL) 0.75 % gel, Apply 1 application  topically 2 (two) times daily., Disp: 45 g, Rfl: 11    ALLERGIES   Anesthetics, amide; Atorvastatin; Oxycodone; Simvastatin; Latex; and Statins     REVIEW OF SYSTEMS   Review of Systems  Constitutional: Negative for chills, fever and weight loss.  Eyes: Negative.   Respiratory: Negative for cough, hemoptysis, sputum production, shortness of breath and wheezing.   Cardiovascular: Negative for chest pain, orthopnea and leg swelling.  Neurological: Negative for dizziness.  All other systems reviewed and are negative.    VS: BP 134/80 (BP Location: Right Arm, Cuff Size: Normal)   Pulse 68   Resp 16   Ht 5' 7.5" (1.715 m)   Wt 217 lb (98.4 kg)   SpO2 93%   BMI 33.49 kg/m      PHYSICAL EXAM  Physical Exam  Constitutional: He is oriented to person, place, and time. He appears well-developed and well-nourished. No distress.  Neck: Neck supple.  Cardiovascular: Normal rate, regular rhythm and normal heart sounds.   No murmur heard. Pulmonary/Chest: Effort normal and breath sounds normal. No respiratory distress. He has no wheezes.  Decreased Lung sounds at RT base  Musculoskeletal: He exhibits no edema.  Neurological: He is alert and oriented to person, place, and time.  Skin: He is not diaphoretic.  Psychiatric:  He has a normal mood and affect.     ASSESSMENT/PLAN   76 yo white male with chronic SOB with DOE with chronic elevated hemidiaphragm etiology is unknown, with underlying OSA  1.albuterol as needed- 2.CT chest:shows RLL lung atalectasis-on biPAP at night for OSA -100% compliance with AHI 4.7 overall feeling well with biPAP machine 3.follow up with Dr Ashby Dawes for sleep disorders as needed 4.overnight pulse oximetry-no oxygen needed at this time 5.referral to Dr. Genevive Bi to evaluate elevated hemidiaphragm-no intervention at this time  Follow up in 6 months   Patient satisfied with Plan of action and management. All questions answered  [name], M.D.  [facility]  Medical Director [place] Director [facility] Cardio-Pulmonary Department

## 2016-09-20 NOTE — Patient Instructions (Signed)
incentive spirometry 10-15 times per day Recommend weight loss

## 2016-11-14 DIAGNOSIS — R3915 Urgency of urination: Secondary | ICD-10-CM | POA: Diagnosis not present

## 2016-11-14 DIAGNOSIS — N401 Enlarged prostate with lower urinary tract symptoms: Secondary | ICD-10-CM | POA: Diagnosis not present

## 2016-12-05 ENCOUNTER — Other Ambulatory Visit: Payer: Self-pay | Admitting: Internal Medicine

## 2016-12-10 ENCOUNTER — Other Ambulatory Visit: Payer: Self-pay | Admitting: Internal Medicine

## 2016-12-12 NOTE — Telephone Encounter (Signed)
Verbal refill given to [facility] at the pharmacy

## 2016-12-12 NOTE — Telephone Encounter (Signed)
Approved: #90 x 0 

## 2016-12-12 NOTE — Telephone Encounter (Signed)
Last filled 11-12-16 #30 (Original rx was for #90 09-13-16) Last OV 07-24-16 Next OV 12-28-16

## 2016-12-13 DIAGNOSIS — M75122 Complete rotator cuff tear or rupture of left shoulder, not specified as traumatic: Secondary | ICD-10-CM | POA: Diagnosis not present

## 2016-12-15 ENCOUNTER — Other Ambulatory Visit: Payer: Self-pay | Admitting: Internal Medicine

## 2016-12-15 MED ORDER — FLUTICASONE PROPIONATE 50 MCG/ACT NA SUSP: 1.0000 | Freq: Every day | NASAL | 2 refills | Status: DC

## 2016-12-15 NOTE — Telephone Encounter (Signed)
Patient has requested from pharmacy dymista be changed back to flonase.

## 2016-12-20 DIAGNOSIS — N401 Enlarged prostate with lower urinary tract symptoms: Secondary | ICD-10-CM | POA: Diagnosis not present

## 2016-12-20 DIAGNOSIS — R3915 Urgency of urination: Secondary | ICD-10-CM | POA: Diagnosis not present

## 2016-12-28 ENCOUNTER — Ambulatory Visit (INDEPENDENT_AMBULATORY_CARE_PROVIDER_SITE_OTHER): Payer: Medicare Other | Admitting: Internal Medicine

## 2016-12-28 ENCOUNTER — Encounter: Payer: Self-pay | Admitting: Internal Medicine

## 2016-12-28 VITALS — BP 124/72 | HR 50 | Temp 97.4°F | Ht 66.5 in | Wt 216.0 lb

## 2016-12-28 DIAGNOSIS — Z7189 Other specified counseling: Secondary | ICD-10-CM

## 2016-12-28 DIAGNOSIS — K2270 Barrett's esophagus without dysplasia: Secondary | ICD-10-CM | POA: Diagnosis not present

## 2016-12-28 DIAGNOSIS — N401 Enlarged prostate with lower urinary tract symptoms: Secondary | ICD-10-CM | POA: Diagnosis not present

## 2016-12-28 DIAGNOSIS — N138 Other obstructive and reflux uropathy: Secondary | ICD-10-CM

## 2016-12-28 DIAGNOSIS — I10 Essential (primary) hypertension: Secondary | ICD-10-CM

## 2016-12-28 DIAGNOSIS — Z0000 Encounter for general adult medical examination without abnormal findings: Secondary | ICD-10-CM | POA: Diagnosis not present

## 2016-12-28 DIAGNOSIS — E785 Hyperlipidemia, unspecified: Secondary | ICD-10-CM

## 2016-12-28 DIAGNOSIS — Z23 Encounter for immunization: Secondary | ICD-10-CM

## 2016-12-28 LAB — CBC WITH DIFFERENTIAL/PLATELET
BASOS ABS: 0.1 10*3/uL (ref 0.0–0.1)
Basophils Relative: 1 % (ref 0.0–3.0)
EOS ABS: 0.4 10*3/uL (ref 0.0–0.7)
Eosinophils Relative: 4.9 % (ref 0.0–5.0)
HEMATOCRIT: 42 % (ref 39.0–52.0)
HEMOGLOBIN: 14.1 g/dL (ref 13.0–17.0)
LYMPHS PCT: 22.1 % (ref 12.0–46.0)
Lymphs Abs: 1.7 10*3/uL (ref 0.7–4.0)
MCHC: 33.6 g/dL (ref 30.0–36.0)
MCV: 87.6 fl (ref 78.0–100.0)
MONOS PCT: 8.5 % (ref 3.0–12.0)
Monocytes Absolute: 0.7 10*3/uL (ref 0.1–1.0)
NEUTROS ABS: 4.9 10*3/uL (ref 1.4–7.7)
Neutrophils Relative %: 63.5 % (ref 43.0–77.0)
Platelets: 215 10*3/uL (ref 150.0–400.0)
RBC: 4.79 Mil/uL (ref 4.22–5.81)
RDW: 13.8 % (ref 11.5–15.5)
WBC: 7.7 10*3/uL (ref 4.0–10.5)

## 2016-12-28 LAB — COMPREHENSIVE METABOLIC PANEL
ALBUMIN: 4.5 g/dL (ref 3.5–5.2)
ALT: 16 U/L (ref 0–53)
AST: 16 U/L (ref 0–37)
Alkaline Phosphatase: 48 U/L (ref 39–117)
BILIRUBIN TOTAL: 1.2 mg/dL (ref 0.2–1.2)
BUN: 19 mg/dL (ref 6–23)
CALCIUM: 9.5 mg/dL (ref 8.4–10.5)
CHLORIDE: 102 meq/L (ref 96–112)
CO2: 31 meq/L (ref 19–32)
CREATININE: 1.02 mg/dL (ref 0.40–1.50)
GFR: 75.57 mL/min (ref >60.00)
Glucose, Bld: 89 mg/dL (ref 70–99)
Potassium: 4.6 mEq/L (ref 3.5–5.1)
Sodium: 139 mEq/L (ref 135–145)
Total Protein: 6.7 g/dL (ref 6.0–8.3)

## 2016-12-28 LAB — LIPID PANEL
CHOL/HDL RATIO: 6
Cholesterol: 276 mg/dL — ABNORMAL HIGH (ref 0–200)
HDL: 46.8 mg/dL (ref >39.00)
LDL CALC: 213 mg/dL — AB (ref 0–99)
NONHDL: 229.04
TRIGLYCERIDES: 82 mg/dL (ref 0.0–149.0)
VLDL: 16.4 mg/dL (ref 0.0–40.0)

## 2016-12-28 LAB — PSA: PSA: 0.96 ng/mL (ref 0.10–4.00)

## 2016-12-28 MED ORDER — TETANUS-DIPHTHERIA TOXOIDS TD 5-2 LFU IM INJ: 0.5000 mL | INJECTION | Freq: Once | INTRAMUSCULAR | 0 refills | Status: AC

## 2016-12-28 NOTE — Assessment & Plan Note (Signed)
I have personally reviewed the Medicare Annual Wellness questionnaire and have noted 1. The patient's medical and social history 2. Their use of alcohol, tobacco or illicit drugs 3. Their current medications and supplements 4. The patient's functional ability including ADL's, fall risks, home safety risks and hearing or visual             impairment. 5. Diet and physical activities 6. Evidence for depression or mood disorders  The patients weight, height, BMI and visual acuity have been recorded in the chart I have made referrals, counseling and provided education to the patient based review of the above and I have provided the pt with a written personalized care plan for preventive services.  I have provided you with a copy of your personalized plan for preventive services. Please take the time to review along with your updated medication list.  Will do last PSA at his request Done with colon screening Rx for Td to pharmacy Pneumovax booster Increasing exercise--plans to lose 10-15# this year

## 2016-12-28 NOTE — Assessment & Plan Note (Signed)
Doing better with this now

## 2016-12-28 NOTE — Progress Notes (Signed)
Subjective:    Patient ID: [name], male    DOB: [birth year], 76 y.o.   MRN: [id number]  HPI Here for Medicare wellness visit and follow up of chronic health conditions Reviewed form and advanced directives Reviewed other doctors No alcohol or tobacco Tries to exercise regularly No falls No depression or anhedonia Vision and hearing are fine No apparent memory problems Independent with instrumental ADLs  He feels good Still gets sciatic nerve pain--manages with this Considering epidural injections--not ready for this Rotator cuff tear on left--pain better after several injections  No chest pain No change in breathing--- limited endurance with elevated hemidiaphragm No regular cough No dizziness or syncope No sig edema Takes some homeopathic supplements from his daughter  Sleeps well with the BiPAP--- he doesn't know the pressure Uses every night with good compliance  Does need the lunesta regularly  Voids okay Did see Dr Jeffie Pollock due to increased frequency Stopped doxazosin and seems to be doing well No problems with this ---no orthostatic symptoms  Takes nexium daily No heartburn or dysphagia Not due for repeat EGD  Current Outpatient Prescriptions on File Prior to Visit  Medication Sig Dispense Refill  . acetaminophen (TYLENOL) 500 MG tablet Take 500 mg by mouth every 8 (eight) hours as needed (for pain).     Marland Kitchen aspirin 81 MG tablet Take 81 mg by mouth daily.      . Azelastine-Fluticasone 137-50 MCG/ACT SUSP Place 1 spray into the nose daily. 69 g 3  . esomeprazole (NEXIUM) 40 MG capsule TAKE 1 CAPSULE DAILY BEFOREBREAKFAST 90 capsule 3  . eszopiclone (LUNESTA) 2 MG TABS tablet TAKE ONE TABLET AT BEDTIME 90 tablet 0  . finasteride (PROSCAR) 5 MG tablet TAKE 1 TABLET DAILY 90 tablet 1  . fluticasone (FLONASE) 50 MCG/ACT nasal spray Place 1 spray into both nostrils daily. 16 g 2  . metroNIDAZOLE (METROGEL) 0.75 % gel Apply 1 application topically 2 (two) times  daily. 45 g 11   No current facility-administered medications on file prior to visit.     Allergies  Allergen Reactions  . Anesthetics, Amide Nausea Only    Other reaction(s): Vomiting After surgery patient has problems with nausea and vomiting due to anesthetics  . Atorvastatin     REACTION: myalgias  . Oxycodone Nausea And Vomiting  . Simvastatin     REACTION: joint and stomach pain  . Latex Rash    Local area  . Statins Other (See Comments)    Joint pain    Past Medical History:  Diagnosis Date  . Allergy   . Anxiety   . BPH (benign prostatic hypertrophy)   . Diverticulitis   . GERD (gastroesophageal reflux disease)   . Heart murmur   . Hyperlipidemia   . Kidney stones   . Obstructive sleep apnea    CPAP  . Osteoarthritis   . Osteoporosis   . Sleep apnea    cpap nightly  . Spinal stenosis     Past Surgical History:  Procedure Laterality Date  . ESOPHAGOGASTRODUODENOSCOPY     colon--6/04, Barrett's--7/05, Barrett's but no cancer--4/08  . EYE MUSCLE SURGERY     L eye muscle surgery--2/01  . JOINT REPLACEMENT  1/12   left TKR  . KNEE ARTHROSCOPY     7/ 09 Arthroscopy left knee--Dr Noemi Chapel  . LIPOMA EXCISION Left 2010   compressing nerve by spine---[facility] Terald Sleeper [facility]  . ROTATOR CUFF REPAIR     R rotator cuff repair--1/00  Noemi Chapel)  .  ROTATOR CUFF REPAIR Left ~2005 & 11/15   biceps repair also in 2015  . TENDON REPAIR     Left knee tendon repair--1966  . TOTAL KNEE ARTHROPLASTY Right 4/16  . VASECTOMY     Vasectomy/spermatocele/?testicular cyst--1970    Family History  Problem Relation Age of Onset  . Hypertension Mother   . Breast cancer Mother   . Colon polyps Mother   . Coronary artery disease Mother   . Diabetes Neg Hx   . Colon cancer Neg Hx   . Esophageal cancer Neg Hx   . Gallbladder disease Neg Hx   . Kidney disease Neg Hx     Social History   Social History  . Marital status: Married    Spouse name: N/A  . Number of children: 3    . Years of education: N/A   Occupational History  . retired Retired   Social History Main Topics  . Smoking status: Never Smoker  . Smokeless tobacco: Never Used  . Alcohol use 0.0 oz/week     Comment: occassional wine  . Drug use: No  . Sexual activity: Not on file   Other Topics Concern  . Not on file   Social History Narrative   Lives at home with wife      Has living will   Wife is health care POA   Would accept resuscitation attempts   No tube feeds if cognitively unaware            Review of Systems Will get some leg cramps at night---better with potassium supplements Sleeps well Appetite is good Weight is fairly stable Wears seat belt Teeth are fine--keeps up with dentist Bowels are fine--no blood No rash. Wants me to check mole on right flank No sig joint pain now    Objective:   Physical Exam  Constitutional: He is oriented to person, place, and time. He appears well-developed. No distress.  HENT:  Mouth/Throat: Oropharynx is clear and moist. No oropharyngeal exudate.  Neck: Normal range of motion. No thyromegaly present.  Cardiovascular: Normal rate, regular rhythm and intact distal pulses.  Exam reveals no gallop.   Soft systolic murmur  Pulmonary/Chest: Effort normal and breath sounds normal. No respiratory distress. He has no wheezes. He has no rales.  Abdominal: Soft. He exhibits no distension. There is no tenderness. There is no rebound and no guarding.  Musculoskeletal: He exhibits no edema or tenderness.  Lymphadenopathy:    He has no cervical adenopathy.  Neurological: He is alert and oriented to person, place, and time.  President--- "Dwaine Deter, Bush" [phone] [id number] Recall 2/3  Skin: No rash noted. No erythema.  Multiple benign lesions          Assessment & Plan:

## 2016-12-28 NOTE — Patient Instructions (Signed)
Please get your tetanus booster at the pharmacy. 

## 2016-12-28 NOTE — Assessment & Plan Note (Signed)
BP Readings from Last 3 Encounters:  12/28/16 124/72  09/20/16 134/80  07/25/16 (!) 144/76   Good control No change needed

## 2016-12-28 NOTE — Assessment & Plan Note (Signed)
Prefers no medication

## 2016-12-28 NOTE — Assessment & Plan Note (Signed)
Controlled with PPI--no symptoms EGD not due for a while

## 2016-12-28 NOTE — Assessment & Plan Note (Signed)
See social history 

## 2016-12-28 NOTE — Addendum Note (Signed)
Addended by: Benson Setting L on: 12/28/2016 10:47 AM   Modules accepted: Orders

## 2017-01-30 ENCOUNTER — Other Ambulatory Visit: Payer: Self-pay | Admitting: Internal Medicine

## 2017-03-07 ENCOUNTER — Other Ambulatory Visit: Payer: Self-pay | Admitting: Internal Medicine

## 2017-03-08 NOTE — Telephone Encounter (Signed)
Approved: 30 x 0 

## 2017-03-08 NOTE — Telephone Encounter (Signed)
Verbal refill given to Shay at pharmacy

## 2017-03-08 NOTE — Telephone Encounter (Signed)
Last filled 02-08-17 #30 Last OV 12-28-16 Next OV 12-29-17

## 2017-03-22 DIAGNOSIS — H2513 Age-related nuclear cataract, bilateral: Secondary | ICD-10-CM | POA: Diagnosis not present

## 2017-04-06 ENCOUNTER — Encounter: Payer: Self-pay | Admitting: Internal Medicine

## 2017-04-07 ENCOUNTER — Ambulatory Visit (INDEPENDENT_AMBULATORY_CARE_PROVIDER_SITE_OTHER): Payer: Medicare Other | Admitting: Internal Medicine

## 2017-04-07 ENCOUNTER — Encounter: Payer: Self-pay | Admitting: Internal Medicine

## 2017-04-07 VITALS — BP 158/92 | HR 72 | Ht 66.5 in | Wt 217.0 lb

## 2017-04-07 DIAGNOSIS — G4733 Obstructive sleep apnea (adult) (pediatric): Secondary | ICD-10-CM

## 2017-04-07 NOTE — Patient Instructions (Signed)
Follow up in 1 year Continue biPAP as prescribed

## 2017-04-07 NOTE — Progress Notes (Signed)
[facility]ation      Date: 04/07/2017,   MRN# [id number] DAMICHAEL HOFMAN 30-Sep-1940 Code Status:  Hosp day:@LENGTHOFSTAYDAYS @ Referring MD: @ATDPROV @     PCP:      AdmissionWeight: 217 lb (98.4 kg)                 CurrentWeight: 217 lb (98.4 kg) CARLESTER KASPAREK is a 76 y.o. old male seen in consultation for chronic SOB     CHIEF COMPLAINT:   Follow up Chronic SOB   HISTORY OF PRESENT ILLNESS   76 yo white male seen today for follow up chronic SOB, patient with a dx of chronic RT sided elevated hemidiaphragm  patient doing really well overall,  No SOb, walks 3 miles per week, no DOE,  Initial weight was 247 down to 212 No signs of infection at this time  PFT 05/29/15 fvc 52% fev1 60%, ratio 83% DLCO 66%  findings  suggest moderate restrictive lung disease BiPAP reports: 100% compliance at last visit AHI 3.6 6MWT WNL  Saw Dr. Genevive Bi for elevated diaphragm-no further intervention at this time   BIPAP   MEDICATIONS    Current Medication:  Current Outpatient Medications:  .  acetaminophen (TYLENOL) 500 MG tablet, Take 500 mg by mouth every 8 (eight) hours as needed (for pain). , Disp: , Rfl:  .  aspirin 81 MG tablet, Take 81 mg by mouth daily.  , Disp: , Rfl:  .  Azelastine-Fluticasone 137-50 MCG/ACT SUSP, Place 1 spray into the nose daily., Disp: 69 g, Rfl: 3 .  esomeprazole (NEXIUM) 40 MG capsule, TAKE 1 CAPSULE DAILY BEFOREBREAKFAST, Disp: 90 capsule, Rfl: 3 .  eszopiclone (LUNESTA) 2 MG TABS tablet, TAKE 1 TABLET AT BEDTIME, Disp: 30 tablet, Rfl: 0 .  finasteride (PROSCAR) 5 MG tablet, TAKE 1 TABLET DAILY, Disp: 90 tablet, Rfl: 3 .  fluticasone (FLONASE) 50 MCG/ACT nasal spray, Place 1 spray into both nostrils daily., Disp: 16 g, Rfl: 2 .  metroNIDAZOLE (METROGEL) 0.75 % gel, Apply 1 application topically 2 (two) times daily., Disp: 45 g, Rfl: 11    ALLERGIES   Anesthetics, amide; Atorvastatin; Oxycodone; Simvastatin; Latex;  and Statins     REVIEW OF SYSTEMS   Review of Systems  Constitutional: Negative for chills, fever and weight loss.  Eyes: Negative.   Respiratory: Negative for cough, hemoptysis, sputum production, shortness of breath and wheezing.   Cardiovascular: Negative for chest pain, orthopnea and leg swelling.  Neurological: Negative for dizziness.  All other systems reviewed and are negative.    VS: BP (!) 158/92 (BP Location: Left Arm, Cuff Size: Normal)   Pulse 72   Ht 5' 6.5" (1.689 m)   Wt 217 lb (98.4 kg)   SpO2 94%   BMI 34.50 kg/m      PHYSICAL EXAM  Physical Exam  Constitutional: He is oriented to person, place, and time. He appears well-developed and well-nourished. No distress.  Neck: Neck supple.  Cardiovascular: Normal rate, regular rhythm and normal heart sounds.  No murmur heard. Pulmonary/Chest: Effort normal and breath sounds normal. No respiratory distress. He has no wheezes.  Decreased Lung sounds at RT base  Musculoskeletal: He exhibits no edema.  Neurological: He is alert and oriented to person, place, and time.  Skin: He is not diaphoretic.  Psychiatric: He has a normal mood and affect.     ASSESSMENT/PLAN   76 yo white male with chronic SOB with DOE with chronic elevated hemidiaphragm etiology is  unknown, with underlying OSA  1.albuterol as needed- 2.CT chest:shows RLL lung atalectasis-on biPAP at night for OSA -100% compliance with AHI 3.6 overall feeling well with biPAP machine 5.RT  elevated hemidiaphragm-no intervention at this time  Follow up in 1 year  Patient satisfied with Plan of action and management. All questions answered  [name], M.D.  [facility]  Medical Director [place] Director [facility] [place] Cardio-Pulmonary Department

## 2017-04-11 DIAGNOSIS — M75122 Complete rotator cuff tear or rupture of left shoulder, not specified as traumatic: Secondary | ICD-10-CM | POA: Diagnosis not present

## 2017-04-12 ENCOUNTER — Other Ambulatory Visit: Payer: Self-pay | Admitting: Internal Medicine

## 2017-04-13 NOTE — Telephone Encounter (Signed)
Verbal refill given to Levada Dy at pharmacy

## 2017-04-13 NOTE — Telephone Encounter (Signed)
Approved: 30 x 0 

## 2017-04-13 NOTE — Telephone Encounter (Signed)
Last filled 03-08-17 #30 Last OV 12-28-16 Next OV 12-29-17

## 2017-05-10 ENCOUNTER — Ambulatory Visit: Payer: Medicare Other | Admitting: Family Medicine

## 2017-05-10 DIAGNOSIS — Z0289 Encounter for other administrative examinations: Secondary | ICD-10-CM

## 2017-05-12 ENCOUNTER — Other Ambulatory Visit: Payer: Self-pay | Admitting: Internal Medicine

## 2017-05-12 NOTE — Telephone Encounter (Signed)
Electronic refill Last refill 04/13/17 #30 Last office visit 12/28/16

## 2017-05-12 NOTE — Telephone Encounter (Signed)
No mention of insomnia on problem list, reviewed last 2 years of notes. Will defer refill to PCP.

## 2017-05-12 NOTE — Telephone Encounter (Signed)
Approved: 30 x 0 

## 2017-05-15 NOTE — Telephone Encounter (Signed)
Verbal refill given to Stepney at pharmacy

## 2017-06-12 ENCOUNTER — Other Ambulatory Visit: Payer: Self-pay

## 2017-06-12 MED ORDER — ESZOPICLONE 2 MG PO TABS: ORAL_TABLET | ORAL | 0 refills | Status: DC

## 2017-06-12 NOTE — Telephone Encounter (Signed)
Last filled 05-15-17 #30 Last OV 12-28-16 Next OV 12-29-17

## 2017-06-27 ENCOUNTER — Encounter: Payer: Self-pay | Admitting: Internal Medicine

## 2017-06-27 ENCOUNTER — Ambulatory Visit (INDEPENDENT_AMBULATORY_CARE_PROVIDER_SITE_OTHER): Payer: Medicare Other | Admitting: Internal Medicine

## 2017-06-27 VITALS — BP 114/80 | HR 63 | Temp 98.1°F | Wt 216.0 lb

## 2017-06-27 DIAGNOSIS — M150 Primary generalized (osteo)arthritis: Secondary | ICD-10-CM

## 2017-06-27 DIAGNOSIS — R200 Anesthesia of skin: Secondary | ICD-10-CM

## 2017-06-27 DIAGNOSIS — G4733 Obstructive sleep apnea (adult) (pediatric): Secondary | ICD-10-CM | POA: Diagnosis not present

## 2017-06-27 DIAGNOSIS — R5383 Other fatigue: Secondary | ICD-10-CM | POA: Diagnosis not present

## 2017-06-27 DIAGNOSIS — Z9289 Personal history of other medical treatment: Secondary | ICD-10-CM

## 2017-06-27 DIAGNOSIS — K2270 Barrett's esophagus without dysplasia: Secondary | ICD-10-CM

## 2017-06-27 DIAGNOSIS — M159 Polyosteoarthritis, unspecified: Secondary | ICD-10-CM

## 2017-06-27 LAB — CBC
HCT: 40.1 % (ref 39.0–52.0)
HEMOGLOBIN: 13.4 g/dL (ref 13.0–17.0)
MCHC: 33.5 g/dL (ref 30.0–36.0)
MCV: 86.2 fl (ref 78.0–100.0)
PLATELETS: 216 10*3/uL (ref 150.0–400.0)
RBC: 4.65 Mil/uL (ref 4.22–5.81)
RDW: 14.2 % (ref 11.5–15.5)
WBC: 6.1 10*3/uL (ref 4.0–10.5)

## 2017-06-27 LAB — COMPREHENSIVE METABOLIC PANEL
ALT: 14 U/L (ref 0–53)
AST: 17 U/L (ref 0–37)
Albumin: 4 g/dL (ref 3.5–5.2)
Alkaline Phosphatase: 38 U/L — ABNORMAL LOW (ref 39–117)
BUN: 19 mg/dL (ref 6–23)
CHLORIDE: 103 meq/L (ref 96–112)
CO2: 32 mEq/L (ref 19–32)
Calcium: 9.3 mg/dL (ref 8.4–10.5)
Creatinine, Ser: 0.95 mg/dL (ref 0.40–1.50)
GFR: 81.92 mL/min (ref >60.00)
GLUCOSE: 92 mg/dL (ref 70–99)
POTASSIUM: 4.3 meq/L (ref 3.5–5.1)
SODIUM: 138 meq/L (ref 135–145)
Total Bilirubin: 1.1 mg/dL (ref 0.2–1.2)
Total Protein: 6.4 g/dL (ref 6.0–8.3)

## 2017-06-27 LAB — VITAMIN B12: VITAMIN B 12: 264 pg/mL (ref 211–911)

## 2017-06-27 LAB — T4, FREE: FREE T4: 1.16 ng/dL (ref 0.60–1.60)

## 2017-06-27 MED ORDER — DICLOFENAC SODIUM 75 MG PO TBEC: 75.0000 mg | DELAYED_RELEASE_TABLET | Freq: Two times a day (BID) | ORAL | 3 refills | Status: DC

## 2017-06-27 NOTE — Assessment & Plan Note (Signed)
Non specific Sleeps okay No depression Suspect self limited (winter blues?) Will check labs Try to reduce lunesta if he can

## 2017-06-27 NOTE — Assessment & Plan Note (Signed)
Sleeps well with CPAP 

## 2017-06-27 NOTE — Assessment & Plan Note (Signed)
Okay to try to cut back to every other day

## 2017-06-27 NOTE — Progress Notes (Signed)
Subjective:    Patient ID: [name], male    DOB: 12/22/1940, 77 y.o.   MRN: [id number]  HPI Here with concerns about his energy and medications  Feels good at first in the morning--but then feels terrible Generally doesn't nap--but did yesterday for 2 hours and it helped a bit Not a natural napper Goes to gym for 1-1.5 hours 3 days a week---then is exhausted  Sleeps well at night with his BiPap Takes the [place] nightly--it works great for him (6 hours solid--then up to void). Then back to light sleep Trazodone didn't do a great job  No chest pain No SOB Bowels are okay No fever and not ill at all  Taking nexium for many years Told to stay on it for Barrett's Last EGD---fairly normal Concerned about side effects  Had been changed to meloxicam by ortho---didn't work like diclofenac He is back on the diclofenac which really helps---tries to limit to once a day  Current Outpatient Medications on File Prior to Visit  Medication Sig Dispense Refill  . acetaminophen (TYLENOL) 500 MG tablet Take 500 mg by mouth every 8 (eight) hours as needed (for pain).     Marland Kitchen alfuzosin (UROXATRAL) 10 MG 24 hr tablet Take 1 tablet by mouth daily.    Marland Kitchen aspirin 81 MG tablet Take 81 mg by mouth daily.      . diclofenac (VOLTAREN) 75 MG EC tablet Take 75 mg by mouth 2 (two) times daily.    Marland Kitchen esomeprazole (NEXIUM) 40 MG capsule TAKE 1 CAPSULE DAILY BEFOREBREAKFAST 90 capsule 3  . eszopiclone (LUNESTA) 2 MG TABS tablet TAKE ONE TABLET AT BEDTIME AS NEEDED FOR SLEEP 30 tablet 0  . finasteride (PROSCAR) 5 MG tablet TAKE 1 TABLET DAILY 90 tablet 3  . fluticasone (FLONASE) 50 MCG/ACT nasal spray Place 1 spray into both nostrils daily. 16 g 2  . metroNIDAZOLE (METROGEL) 0.75 % gel Apply 1 application topically 2 (two) times daily. 45 g 11   No current facility-administered medications on file prior to visit.     Allergies  Allergen Reactions  . Anesthetics, Amide Nausea Only    Other  reaction(s): Vomiting After surgery patient has problems with nausea and vomiting due to anesthetics  . Atorvastatin     REACTION: myalgias  . Oxycodone Nausea And Vomiting  . Simvastatin     REACTION: joint and stomach pain  . Latex Rash    Local area  . Statins Other (See Comments)    Joint pain    Past Medical History:  Diagnosis Date  . Allergy   . Anxiety   . BPH (benign prostatic hypertrophy)   . Diverticulitis   . GERD (gastroesophageal reflux disease)   . Heart murmur   . Hyperlipidemia   . Kidney stones   . Obstructive sleep apnea    CPAP  . Osteoarthritis   . Osteoporosis   . Sleep apnea    cpap nightly  . Spinal stenosis     Past Surgical History:  Procedure Laterality Date  . ESOPHAGOGASTRODUODENOSCOPY     colon--6/04, Barrett's--7/05, Barrett's but no cancer--4/08  . EYE MUSCLE SURGERY     L eye muscle surgery--2/01  . JOINT REPLACEMENT  1/12   left TKR  . KNEE ARTHROSCOPY     7/ 09 Arthroscopy left knee--Dr Noemi Chapel  . LIPOMA EXCISION Left 2010   compressing nerve by spine---Dr Terald Sleeper at [facility]  . ROTATOR CUFF REPAIR     R rotator cuff repair--1/00  (  [facility])  . ROTATOR CUFF REPAIR Left ~2005 & 11/15   biceps repair also in 2015  . TENDON REPAIR     Left knee tendon repair--1966  . TOTAL KNEE ARTHROPLASTY Right 4/16  . VASECTOMY     Vasectomy/spermatocele/?testicular cyst--1970    Family History  Problem Relation Age of Onset  . Hypertension Mother   . Breast cancer Mother   . Colon polyps Mother   . Coronary artery disease Mother   . Diabetes Neg Hx   . Colon cancer Neg Hx   . Esophageal cancer Neg Hx   . Gallbladder disease Neg Hx   . Kidney disease Neg Hx     Social History   Socioeconomic History  . Marital status: Married    Spouse name: Not on file  . Number of children: 3  . Years of education: Not on file  . Highest education level: Not on file  Social Needs  . Financial resource strain: Not on file  . Food insecurity -  worry: Not on file  . Food insecurity - inability: Not on file  . Transportation needs - medical: Not on file  . Transportation needs - non-medical: Not on file  Occupational History  . Occupation: retired    Fish farm manager: RETIRED  Tobacco Use  . Smoking status: Never Smoker  . Smokeless tobacco: Never Used  Substance and Sexual Activity  . Alcohol use: Yes    Alcohol/week: 0.0 oz    Comment: occassional wine  . Drug use: No  . Sexual activity: Not on file  Other Topics Concern  . Not on file  Social History Narrative   Lives at home with wife      Has living will   Wife is health care POA   Would accept resuscitation attempts   No tube feeds if cognitively unaware         Review of Systems Appetite is okay---good breakfast. Late lunch 2-3 o'clock--large. Then only snacks in evening Weight is stable--or up a bit Voids well on his dual therapy Some numbness in his fingers    Objective:   Physical Exam  Constitutional: He appears well-developed. No distress.  Neck: No thyromegaly present.  Cardiovascular: Normal rate, regular rhythm and normal heart sounds. Exam reveals no gallop.  No murmur heard. Pulmonary/Chest: Effort normal and breath sounds normal. No respiratory distress. He has no wheezes. He has no rales.  Abdominal: Soft. He exhibits no distension. There is no tenderness. There is no rebound and no guarding.  Musculoskeletal: He exhibits no edema or tenderness.  Lymphadenopathy:    He has no cervical adenopathy.    He has no axillary adenopathy.       Right: No inguinal adenopathy present.       Left: No inguinal adenopathy present.  Psychiatric: He has a normal mood and affect. His behavior is normal.          Assessment & Plan:

## 2017-06-27 NOTE — Assessment & Plan Note (Signed)
Does well with the diclofenac Will try to limit to daily or less

## 2017-06-27 NOTE — Patient Instructions (Addendum)
Please try melatonin 3mg  or up to 6mg  on some nights---instead of the lunesta (to see if that will work for you). You can try to cut back the nexium to just every other day (if that controls your symptoms).

## 2017-06-28 ENCOUNTER — Other Ambulatory Visit: Payer: Self-pay | Admitting: Internal Medicine

## 2017-06-28 ENCOUNTER — Telehealth: Payer: Self-pay | Admitting: Internal Medicine

## 2017-06-28 NOTE — Telephone Encounter (Signed)
ERROR

## 2017-06-29 NOTE — Telephone Encounter (Signed)
He does not need the refill right now. He is going to try the melatonin after he finishes the 7 Lunesta he has left.

## 2017-06-29 NOTE — Telephone Encounter (Signed)
[name] is still in his chart even though you had discussed taking Melatonin. Was not sure fi you wanted to do a refill.

## 2017-06-29 NOTE — Telephone Encounter (Signed)
Find out how he did with the melatonin and if he still needs this

## 2017-07-11 DIAGNOSIS — M75122 Complete rotator cuff tear or rupture of left shoulder, not specified as traumatic: Secondary | ICD-10-CM | POA: Diagnosis not present

## 2017-07-15 ENCOUNTER — Other Ambulatory Visit: Payer: Self-pay | Admitting: Internal Medicine

## 2017-07-17 NOTE — Telephone Encounter (Signed)
Last filled 06-12-17 #30 Last OV 06-27-17 Next OV 12-29-17

## 2017-08-12 ENCOUNTER — Other Ambulatory Visit: Payer: Self-pay | Admitting: Internal Medicine

## 2017-08-15 NOTE — Telephone Encounter (Signed)
Last Rx 07/17/17. Last OV 06/2017. pls advise

## 2017-09-06 ENCOUNTER — Other Ambulatory Visit: Payer: Self-pay | Admitting: Internal Medicine

## 2017-09-13 ENCOUNTER — Other Ambulatory Visit: Payer: Self-pay | Admitting: Internal Medicine

## 2017-09-13 NOTE — Telephone Encounter (Signed)
Spoke to [name]. Advised him Dr Silvio Pate is out of the office this afternoon and it will be taken care of tomorrow. He said that was fine.

## 2017-09-13 NOTE — Telephone Encounter (Signed)
[name] is requesting this to be re-filled today. He is calling them and saying that he is going to be out of town next week. She said that they have faxed it twice, once on Friday and once today

## 2017-10-04 DIAGNOSIS — L821 Other seborrheic keratosis: Secondary | ICD-10-CM | POA: Diagnosis not present

## 2017-10-04 DIAGNOSIS — B351 Tinea unguium: Secondary | ICD-10-CM | POA: Diagnosis not present

## 2017-10-04 DIAGNOSIS — L918 Other hypertrophic disorders of the skin: Secondary | ICD-10-CM | POA: Diagnosis not present

## 2017-10-04 DIAGNOSIS — D1801 Hemangioma of skin and subcutaneous tissue: Secondary | ICD-10-CM | POA: Diagnosis not present

## 2017-10-10 ENCOUNTER — Ambulatory Visit (INDEPENDENT_AMBULATORY_CARE_PROVIDER_SITE_OTHER): Payer: Medicare Other | Admitting: Internal Medicine

## 2017-10-10 ENCOUNTER — Encounter: Payer: Self-pay | Admitting: Internal Medicine

## 2017-10-10 VITALS — BP 138/74 | HR 95 | Temp 97.9°F | Resp 16 | Wt 218.0 lb

## 2017-10-10 DIAGNOSIS — J0100 Acute maxillary sinusitis, unspecified: Secondary | ICD-10-CM | POA: Diagnosis not present

## 2017-10-10 MED ORDER — AMOXICILLIN-POT CLAVULANATE 875-125 MG PO TABS: 1.0000 | ORAL_TABLET | Freq: Two times a day (BID) | ORAL | 0 refills | Status: DC

## 2017-10-10 MED ORDER — FLUTICASONE PROPIONATE 50 MCG/ACT NA SUSP: 1.0000 | Freq: Every day | NASAL | 2 refills | Status: DC

## 2017-10-10 NOTE — Assessment & Plan Note (Signed)
Likely bacterial  Start augmentin otc cold medications Rest, fluid Call if no improvement  

## 2017-10-10 NOTE — Progress Notes (Signed)
Subjective:    Patient ID: [name], male    DOB: Jan 07, 1941, 77 y.o.   MRN: [id number]  HPI He is here for an acute visit for cold symptoms.  His symptoms started 5 days ago.    He is experiencing coughing inially and it went up to his head.  The cough has improved, but he still coughs on occasion.  He states subjective fever, chills, nasal congestion, right sided sinus pain/pressure, ear pain/pressure, sore throat and headaches.  He denies SOB, wheeze and lightheadedness.   He has tried taking tylenol sinus.  He used the neti pot.  He usually can fight off his sinus infections with natural things such as essential oils - his last infection was about 18 months ago he thinks.  This one he just can not fight off.    Medications and allergies reviewed with patient and updated if appropriate.  Patient Active Problem List   Diagnosis Date Noted  . Fatigue 06/27/2017  . Rosacea 03/12/2015  . Advance directive discussed with patient 12/15/2014  . Arthritis of knee, degenerative 07/23/2014  . Obstructive sleep apnea   . Class 1 obesity 03/13/2014  . Benign fibroma of prostate 03/13/2014  . Lumbar radicular pain 09/12/2012  . Osteoarthritis, hand 10/20/2011  . Routine general medical examination at a health care facility 07/26/2011  . Osteoarthritis, multiple sites 07/10/2009  . Obesity 12/04/2008  . Essential hypertension 08/13/2008  . BARRETTS ESOPHAGUS 08/13/2008  . HIATAL HERNIA 08/13/2008  . BPH with obstruction/lower urinary tract symptoms 12/12/2007  . SPINAL STENOSIS, LUMBAR 09/21/2007  . Hyperlipemia 10/09/2006  . ALLERGIC RHINITIS 10/09/2006  . GERD 10/09/2006  . DIVERTICULOSIS, COLON 10/09/2006    Current Outpatient Medications on File Prior to Visit  Medication Sig Dispense Refill  . acetaminophen (TYLENOL) 500 MG tablet Take 500 mg by mouth every 8 (eight) hours as needed (for pain).     Marland Kitchen alfuzosin (UROXATRAL) 10 MG 24 hr tablet Take 1 tablet by mouth  daily.    Marland Kitchen aspirin 81 MG tablet Take 81 mg by mouth daily.      . diclofenac (VOLTAREN) 75 MG EC tablet Take 1 tablet (75 mg total) by mouth 2 (two) times daily. 180 tablet 3  . esomeprazole (NEXIUM) 40 MG capsule TAKE 1 CAPSULE DAILY BEFOREBREAKFAST 90 capsule 3  . eszopiclone (LUNESTA) 2 MG TABS tablet TAKE ONE TABLET AT BEDTIME AS NEEDED FOR SLEEP 30 tablet 1  . finasteride (PROSCAR) 5 MG tablet TAKE 1 TABLET DAILY 90 tablet 3  . fluticasone (FLONASE) 50 MCG/ACT nasal spray Place 1 spray into both nostrils daily. 16 g 2  . metroNIDAZOLE (METROGEL) 0.75 % gel Apply 1 application topically 2 (two) times daily. 45 g 11   No current facility-administered medications on file prior to visit.     Past Medical History:  Diagnosis Date  . Allergy   . Anxiety   . BPH (benign prostatic hypertrophy)   . Diverticulitis   . GERD (gastroesophageal reflux disease)   . Heart murmur   . Hyperlipidemia   . Kidney stones   . Obstructive sleep apnea    CPAP  . Osteoarthritis   . Osteoporosis   . Sleep apnea    cpap nightly  . Spinal stenosis     Past Surgical History:  Procedure Laterality Date  . ESOPHAGOGASTRODUODENOSCOPY     colon--6/04, Barrett's--7/05, Barrett's but no cancer--4/08  . EYE MUSCLE SURGERY     L eye muscle surgery--2/01  .  JOINT REPLACEMENT  1/12   left TKR  . KNEE ARTHROSCOPY     7/ 09 Arthroscopy left knee--Dr Noemi Chapel  . LIPOMA EXCISION Left 2010   compressing nerve by spine---Dr Terald Sleeper at [facility]  . ROTATOR CUFF REPAIR     R rotator cuff repair--1/00  Noemi Chapel)  . ROTATOR CUFF REPAIR Left ~2005 & 11/15   biceps repair also in 2015  . TENDON REPAIR     Left knee tendon repair--1966  . TOTAL KNEE ARTHROPLASTY Right 4/16  . VASECTOMY     Vasectomy/spermatocele/?testicular cyst--1970    Social History   Socioeconomic History  . Marital status: Married    Spouse name: Not on file  . Number of children: 3  . Years of education: Not on file  . Highest education  level: Not on file  Occupational History  . Occupation: retired    Fish farm manager: RETIRED  Social Needs  . Financial resource strain: Not on file  . Food insecurity:    Worry: Not on file    Inability: Not on file  . Transportation needs:    Medical: Not on file    Non-medical: Not on file  Tobacco Use  . Smoking status: Never Smoker  . Smokeless tobacco: Never Used  Substance and Sexual Activity  . Alcohol use: Yes    Alcohol/week: 0.0 oz    Comment: occassional wine  . Drug use: No  . Sexual activity: Not on file  Lifestyle  . Physical activity:    Days per week: Not on file    Minutes per session: Not on file  . Stress: Not on file  Relationships  . Social connections:    Talks on phone: Not on file    Gets together: Not on file    Attends religious service: Not on file    Active member of club or organization: Not on file    Attends meetings of clubs or organizations: Not on file    Relationship status: Not on file  Other Topics Concern  . Not on file  Social History Narrative   Lives at home with wife      Has living will   Wife is health care POA   Would accept resuscitation attempts   No tube feeds if cognitively unaware          Family History  Problem Relation Age of Onset  . Hypertension Mother   . Breast cancer Mother   . Colon polyps Mother   . Coronary artery disease Mother   . Diabetes Neg Hx   . Colon cancer Neg Hx   . Esophageal cancer Neg Hx   . Gallbladder disease Neg Hx   . Kidney disease Neg Hx     Review of Systems  Constitutional: Positive for chills and fever.  HENT: Positive for congestion, ear pain, sinus pressure, sinus pain (right side) and sore throat.   Respiratory: Positive for cough (occasional). Negative for shortness of breath and wheezing.   Gastrointestinal: Negative for nausea.  Musculoskeletal: Negative for myalgias.  Neurological: Positive for headaches. Negative for dizziness and light-headedness.       Objective:    Vitals:   10/10/17 1039  BP: 138/74  Pulse: 95  Resp: 16  Temp: 97.9 F (36.6 C)  SpO2: 97%   Filed Weights   10/10/17 1039  Weight: 218 lb (98.9 kg)   Body mass index is 34.66 kg/m.  Wt Readings from Last 3 Encounters:  10/10/17 218 lb (98.9 kg)  06/27/17 216 lb (98 kg)  04/07/17 217 lb (98.4 kg)     Physical Exam GENERAL APPEARANCE: Appears stated age, well appearing, NAD EYES: conjunctiva clear, no icterus HEENT: bilateral tympanic membranes and ear canals normal, oropharynx with mild erythema, right sided sinus tenderness, no thyromegaly, trachea midline, no cervical or supraclavicular lymphadenopathy LUNGS: Clear to auscultation without wheeze or crackles, unlabored breathing, good air entry bilaterally CARDIOVASCULAR: Normal S1,S2 without murmurs, no edema SKIN: warm, dry        Assessment & Plan:   See Problem List for Assessment and Plan of chronic medical problems.

## 2017-10-10 NOTE — Patient Instructions (Signed)
Take the antibiotic as prescribed - complete the entire course.    Continue over the counter cold medication, advil and tylenol.  Increase your fluids and rest.    Call if no improvement       Sinusitis, Adult Sinusitis is soreness and inflammation of your sinuses. Sinuses are hollow spaces in the bones around your face. Your sinuses are located:  Around your eyes.  In the middle of your forehead.  Behind your nose.  In your cheekbones.  Your sinuses and nasal passages are lined with a stringy fluid (mucus). Mucus normally drains out of your sinuses. When your nasal tissues become inflamed or swollen, the mucus can become trapped or blocked so air cannot flow through your sinuses. This allows bacteria, viruses, and funguses to grow, which leads to infection. Sinusitis can develop quickly and last for 7?10 days (acute) or for more than 12 weeks (chronic). Sinusitis often develops after a cold. What are the causes? This condition is caused by anything that creates swelling in the sinuses or stops mucus from draining, including:  Allergies.  Asthma.  Bacterial or viral infection.  Abnormally shaped bones between the nasal passages.  Nasal growths that contain mucus (nasal polyps).  Narrow sinus openings.  Pollutants, such as chemicals or irritants in the air.  A foreign object stuck in the nose.  A fungal infection. This is rare.  What increases the risk? The following factors may make you more likely to develop this condition:  Having allergies or asthma.  Having had a recent cold or respiratory tract infection.  Having structural deformities or blockages in your nose or sinuses.  Having a weak immune system.  Doing a lot of swimming or diving.  Overusing nasal sprays.  Smoking.  What are the signs or symptoms? The main symptoms of this condition are pain and a feeling of pressure around the affected sinuses. Other symptoms include:  Upper  toothache.  Earache.  Headache.  Bad breath.  Decreased sense of smell and taste.  A cough that may get worse at night.  Fatigue.  Fever.  Thick drainage from your nose. The drainage is often green and it may contain pus (purulent).  Stuffy nose or congestion.  Postnasal drip. This is when extra mucus collects in the throat or back of the nose.  Swelling and warmth over the affected sinuses.  Sore throat.  Sensitivity to light.  How is this diagnosed? This condition is diagnosed based on symptoms, a medical history, and a physical exam. To find out if your condition is acute or chronic, your health care provider may:  Look in your nose for signs of nasal polyps.  Tap over the affected sinus to check for signs of infection.  View the inside of your sinuses using an imaging device that has a light attached (endoscope).  If your health care provider suspects that you have chronic sinusitis, you may also:  Be tested for allergies.  Have a sample of mucus taken from your nose (nasal culture) and checked for bacteria.  Have a mucus sample examined to see if your sinusitis is related to an allergy.  If your sinusitis does not respond to treatment and it lasts longer than 8 weeks, you may have an MRI or CT scan to check your sinuses. These scans also help to determine how severe your infection is. In rare cases, a bone biopsy may be done to rule out more serious types of fungal sinus disease. How is this treated? Treatment   for sinusitis depends on the cause and whether your condition is chronic or acute. If a virus is causing your sinusitis, your symptoms will go away on their own within 10 days. You may be given medicines to relieve your symptoms, including:  Topical nasal decongestants. They shrink swollen nasal passages and let mucus drain from your sinuses.  Antihistamines. These drugs block inflammation that is triggered by allergies. This can help to ease swelling in  your nose and sinuses.  Topical nasal corticosteroids. These are nasal sprays that ease inflammation and swelling in your nose and sinuses.  Nasal saline washes. These rinses can help to get rid of thick mucus in your nose.  If your condition is caused by bacteria, you will be given an antibiotic medicine. If your condition is caused by a fungus, you will be given an antifungal medicine. Surgery may be needed to correct underlying conditions, such as narrow nasal passages. Surgery may also be needed to remove polyps. Follow these instructions at home: Medicines  Take, use, or apply over-the-counter and prescription medicines only as told by your health care provider. These may include nasal sprays.  If you were prescribed an antibiotic medicine, take it as told by your health care provider. Do not stop taking the antibiotic even if you start to feel better. Hydrate and Humidify  Drink enough water to keep your urine clear or pale yellow. Staying hydrated will help to thin your mucus.  Use a cool mist humidifier to keep the humidity level in your home above 50%.  Inhale steam for 10-15 minutes, 3-4 times a day or as told by your health care provider. You can do this in the bathroom while a hot shower is running.  Limit your exposure to cool or dry air. Rest  Rest as much as possible.  Sleep with your head raised (elevated).  Make sure to get enough sleep each night. General instructions  Apply a warm, moist washcloth to your face 3-4 times a day or as told by your health care provider. This will help with discomfort.  Wash your hands often with soap and water to reduce your exposure to viruses and other germs. If soap and water are not available, use hand sanitizer.  Do not smoke. Avoid being around people who are smoking (secondhand smoke).  Keep all follow-up visits as told by your health care provider. This is important. Contact a health care provider if:  You have a  fever.  Your symptoms get worse.  Your symptoms do not improve within 10 days. Get help right away if:  You have a severe headache.  You have persistent vomiting.  You have pain or swelling around your face or eyes.  You have vision problems.  You develop confusion.  Your neck is stiff.  You have trouble breathing. This information is not intended to replace advice given to you by your health care provider. Make sure you discuss any questions you have with your health care provider. Document Released: 04/25/2005 Document Revised: 12/20/2015 Document Reviewed: 02/18/2015 Elsevier Interactive Patient Education  2018 [employer]   

## 2017-11-15 ENCOUNTER — Other Ambulatory Visit: Payer: Self-pay | Admitting: Internal Medicine

## 2017-11-15 NOTE — Telephone Encounter (Signed)
Last filled 10-16-17 #30 Last OV 06-27-17 Next OV 01-24-18  Hyman Hopes

## 2017-11-18 NOTE — Progress Notes (Signed)
Cardiology Office Note   Date:  11/20/2017   ID:  [name], DOB 1940/10/30, MRN [id number]  PCP:  Venia Carbon, MD  Cardiologist:   No primary care provider on file. Referring:  Venia Carbon, MD  Chief Complaint  Patient presents with  . Aortic Stenosis      History of Present Illness: [name] is a 77 y.o. male who is referred by Venia Carbon, MD for evaluation of AS.   I saw him last over three years ago for evaluation of a heart murmur and AI.    This was mild.  He got a letter from [place] to return for follow up.  He has done well.  The patient denies any new symptoms such as chest discomfort, neck or arm discomfort. There has been no new shortness of breath, PND or orthopnea. There have been no reported palpitations, presyncope or syncope.   He goes to the gym 3 x per week.  He works in his yard.    Past Medical History:  Diagnosis Date  . Allergy   . Anxiety   . Aortic stenosis   . BPH (benign prostatic hypertrophy)   . Diaphragm dysfunction    right frozen diaphragm  . Diverticulitis   . GERD (gastroesophageal reflux disease)   . Hyperlipidemia   . Kidney stones   . Obstructive sleep apnea    CPAP  . Osteoarthritis   . Osteoporosis   . Sleep apnea    cpap nightly  . Spinal stenosis     Past Surgical History:  Procedure Laterality Date  . ESOPHAGOGASTRODUODENOSCOPY     colon--6/04, Barrett's--7/05, Barrett's but no cancer--4/08  . EYE MUSCLE SURGERY     L eye muscle surgery--2/01  . JOINT REPLACEMENT  1/12   left TKR  . KNEE ARTHROSCOPY     7/ 09 Arthroscopy left knee--Dr Noemi Chapel  . LIPOMA EXCISION Left 2010   compressing nerve by spine---Dr Terald Sleeper at [facility]  . ROTATOR CUFF REPAIR     R rotator cuff repair--1/00  Noemi Chapel)  . ROTATOR CUFF REPAIR Left ~2005 & 11/15   biceps repair also in 2015  . TENDON REPAIR     Left knee tendon repair--1966  . TOTAL KNEE ARTHROPLASTY Right 4/16  . VASECTOMY      Vasectomy/spermatocele/?testicular cyst--1970     Current Outpatient Medications  Medication Sig Dispense Refill  . acetaminophen (TYLENOL) 500 MG tablet Take 500 mg by mouth every 8 (eight) hours as needed (for pain).     Marland Kitchen alfuzosin (UROXATRAL) 10 MG 24 hr tablet Take 1 tablet by mouth daily.    Marland Kitchen aspirin 81 MG tablet Take 81 mg by mouth daily.      . diclofenac (VOLTAREN) 75 MG EC tablet Take 1 tablet (75 mg total) by mouth 2 (two) times daily. 180 tablet 3  . esomeprazole (NEXIUM) 40 MG capsule TAKE 1 CAPSULE DAILY BEFOREBREAKFAST 90 capsule 3  . eszopiclone (LUNESTA) 2 MG TABS tablet TAKE 1 TABLET BY MOUTH AT BEDTIME AS NEEDED FOR SLEEP 30 tablet 0  . finasteride (PROSCAR) 5 MG tablet TAKE 1 TABLET DAILY 90 tablet 3  . metroNIDAZOLE (METROGEL) 0.75 % gel Apply 1 application topically 2 (two) times daily. 45 g 11  . fluticasone (FLONASE) 50 MCG/ACT nasal spray Place 1 spray into both nostrils daily. (Patient not taking: Reported on 11/20/2017) 16 g 2   No current facility-administered medications for this visit.     Allergies:  Anesthetics, amide; Atorvastatin; Oxycodone; Simvastatin; Latex; and Statins    Social History:  The patient  reports that he has never smoked. He has never used smokeless tobacco. He reports that he drinks alcohol. He reports that he does not use drugs.   Family History:  The patient's family history includes Breast cancer in his mother; Colon polyps in his mother; Coronary artery disease in his mother; Hypertension in his mother.    ROS:  Please see the history of present illness.   Otherwise, review of systems are positive for none.   All other systems are reviewed and negative.    PHYSICAL EXAM: VS:  BP 126/72   Pulse 84   Ht 5' 6.5" (1.689 m)   Wt 216 lb (98 kg)   SpO2 98%   BMI 34.34 kg/m  , BMI Body mass index is 34.34 kg/m. GENERAL:  Well appearing HEENT:  Pupils equal round and reactive, fundi not visualized, oral mucosa unremarkable NECK:   No jugular venous distention, waveform within normal limits, carotid upstroke brisk and symmetric, no bruits, no thyromegaly LYMPHATICS:  No cervical, inguinal adenopathy LUNGS:  Clear to auscultation bilaterally BACK:  No CVA tenderness CHEST:  Unremarkable HEART:  PMI not displaced or sustained,S1 and S2 within normal limits, no S3, no S4, no clicks, no rubs, 2 out of 6 apical systolic murmur early to mid peaking and radiating at the aortic outflow tract, mild diastolic murmurs ABD:  Flat, positive bowel sounds normal in frequency in pitch, no bruits, no rebound, no guarding, no midline pulsatile mass, no hepatomegaly, no splenomegaly EXT:  2 plus pulses throughout, no edema, no cyanosis no clubbing SKIN:  No rashes no nodules NEURO:  Cranial nerves II through XII grossly intact, motor grossly intact throughout PSYCH:  Cognitively intact, oriented to person place and time    EKG:  EKG is ordered today. The ekg ordered today demonstrates sinus rhythm, rate 84, left axis deviation, left ventricular hypertrophy, nonspecific inferior T wave changes.   Recent Labs: 06/27/2017: ALT 14; BUN 19; Creatinine, Ser 0.95; Hemoglobin 13.4; Platelets 216.0; Potassium 4.3; Sodium 138    Lipid Panel    Component Value Date/Time   CHOL 276 (H) 12/28/2016 1051   TRIG 82.0 12/28/2016 1051   HDL 46.80 12/28/2016 1051   CHOLHDL 6 12/28/2016 1051   VLDL 16.4 12/28/2016 1051   LDLCALC 213 (H) 12/28/2016 1051   LDLDIRECT 157.4 08/16/2012 1626      Wt Readings from Last 3 Encounters:  11/20/17 216 lb (98 kg)  10/10/17 218 lb (98.9 kg)  06/27/17 216 lb (98 kg)      Other studies Reviewed: Additional studies/ records that were reviewed today include: Labs. Review of the above records demonstrates:  Please see elsewhere in the note.     ASSESSMENT AND PLAN:  AI: I will follow-up with an echocardiogram for evaluation of this and his previous aortic stenosis.  HTN:  The blood pressure is at  target. No change in medications is indicated. We will continue with therapeutic lifestyle changes (TLC).  DYSLIPIDEMIA:    He has been intolerant to statins but would consider PCSK9 inhibitor.  I will refer him to lipid clinic.  LDL is greater than 200.    AORTIC ROOT ENLARGEMENT:  He will have this evaluated with the echo.  He will likely need routine follow up CT.     Current medicines are reviewed at length with the patient today.  The patient does not have concerns regarding  medicines.  The following changes have been made:  no change  Labs/ tests ordered today include:   Orders Placed This Encounter  Procedures  . EKG 12-Lead  . ECHOCARDIOGRAM COMPLETE     Disposition:   FU with me in two years.      Signed, Minus Breeding, MD  11/20/2017 10:02 AM    [facility]

## 2017-11-20 ENCOUNTER — Ambulatory Visit (INDEPENDENT_AMBULATORY_CARE_PROVIDER_SITE_OTHER): Payer: Medicare Other | Admitting: Cardiology

## 2017-11-20 ENCOUNTER — Encounter: Payer: Self-pay | Admitting: Cardiology

## 2017-11-20 VITALS — BP 126/72 | HR 84 | Ht 66.5 in | Wt 216.0 lb

## 2017-11-20 DIAGNOSIS — I350 Nonrheumatic aortic (valve) stenosis: Secondary | ICD-10-CM

## 2017-11-20 DIAGNOSIS — I712 Thoracic aortic aneurysm, without rupture: Secondary | ICD-10-CM

## 2017-11-20 DIAGNOSIS — I7121 Aneurysm of the ascending aorta, without rupture: Secondary | ICD-10-CM | POA: Insufficient documentation

## 2017-11-20 NOTE — Patient Instructions (Addendum)
Medication Instructions:  Continue current medications  If you need a refill on your cardiac medications before your next appointment, please call your pharmacy.  Labwork: None Ordered  Testing/Procedures: Your physician has requested that you have an echocardiogram. Echocardiography is a painless test that uses sound waves to create images of your heart. It provides your doctor with information about the size and shape of your heart and how well your heart's chambers and valves are working. This procedure takes approximately one hour. There are no restrictions for this procedure.   Follow-Up: Your physician recommends that you schedule a follow-up appointment in: [place] physician wants you to follow-up in: 2 Year. You should receive a reminder letter in the mail two months in advance. If you do not receive a letter, please call our office [phone].      Thank you for choosing [facility] at [facility]!!

## 2017-11-30 DIAGNOSIS — M79674 Pain in right toe(s): Secondary | ICD-10-CM | POA: Diagnosis not present

## 2017-11-30 DIAGNOSIS — M79675 Pain in left toe(s): Secondary | ICD-10-CM | POA: Diagnosis not present

## 2017-11-30 DIAGNOSIS — B351 Tinea unguium: Secondary | ICD-10-CM | POA: Diagnosis not present

## 2017-12-14 ENCOUNTER — Other Ambulatory Visit: Payer: Self-pay | Admitting: Pharmacist Clinician (PhC)/ Clinical Pharmacy Specialist

## 2017-12-14 ENCOUNTER — Ambulatory Visit (INDEPENDENT_AMBULATORY_CARE_PROVIDER_SITE_OTHER): Payer: Medicare Other | Admitting: Pharmacist Clinician (PhC)/ Clinical Pharmacy Specialist

## 2017-12-14 ENCOUNTER — Other Ambulatory Visit: Payer: Self-pay

## 2017-12-14 ENCOUNTER — Ambulatory Visit (HOSPITAL_COMMUNITY): Payer: Medicare Other | Attending: Cardiovascular Disease

## 2017-12-14 DIAGNOSIS — G5603 Carpal tunnel syndrome, bilateral upper limbs: Secondary | ICD-10-CM | POA: Diagnosis not present

## 2017-12-14 DIAGNOSIS — I088 Other rheumatic multiple valve diseases: Secondary | ICD-10-CM | POA: Insufficient documentation

## 2017-12-14 DIAGNOSIS — M75122 Complete rotator cuff tear or rupture of left shoulder, not specified as traumatic: Secondary | ICD-10-CM | POA: Diagnosis not present

## 2017-12-14 DIAGNOSIS — I350 Nonrheumatic aortic (valve) stenosis: Secondary | ICD-10-CM

## 2017-12-14 DIAGNOSIS — E785 Hyperlipidemia, unspecified: Secondary | ICD-10-CM

## 2017-12-14 MED ORDER — PRAVASTATIN SODIUM 20 MG PO TABS: 20.0000 mg | ORAL_TABLET | Freq: Every evening | ORAL | 3 refills | Status: DC

## 2017-12-14 NOTE — Patient Instructions (Addendum)
Get your cholesterol labs drawn (fasting) in the next few days.  Start pravastatin 20 mg once daily in the evenings.  If you have any problems with this, please call to let me know.  Kristin/Raquel [phone]  Once we get these results we will submit the request to your insurance company.  Evolocumab injection What is this medicine? EVOLOCUMAB (e voe LOK ue mab) is known as a PCSK9 inhibitor. It is used to lower the level of cholesterol in the blood. It may be used alone or in combination with other cholesterol-lowering drugs. This drug may also be used to reduce the risk of heart attack, stroke, and certain types of heart surgery in patients with heart disease. This medicine may be used for other purposes; ask your health care provider or pharmacist if you have questions. COMMON BRAND NAME(S): REPATHA What should I tell my health care provider before I take this medicine? They need to know if you have any of these conditions: -an unusual or allergic reaction to evolocumab, other medicines, foods, dyes, or preservatives -pregnant or trying to get pregnant -breast-feeding How should I use this medicine? This medicine is for injection under the skin. You will be taught how to prepare and give this medicine. Use exactly as directed. Take your medicine at regular intervals. Do not take your medicine more often than directed. It is important that you put your used needles and syringes in a special sharps container. Do not put them in a trash can. If you do not have a sharps container, call your pharmacist or health care provider to get one. Talk to your pediatrician regarding the use of this medicine in children. While this drug may be prescribed for children as young as 13 years for selected conditions, precautions do apply. Overdosage: If you think you have taken too much of this medicine contact a poison control center or emergency room at once. NOTE: This medicine is only for you. Do not share  this medicine with others. What if I miss a dose? If you miss a dose, take it as soon as you can if there are more than 7 days until the next scheduled dose, or skip the missed dose and take the next dose according to your original schedule. Do not take double or extra doses. What may interact with this medicine? Interactions are not expected. This list may not describe all possible interactions. Give your health care provider a list of all the medicines, herbs, non-prescription drugs, or dietary supplements you use. Also tell them if you smoke, drink alcohol, or use illegal drugs. Some items may interact with your medicine. What should I watch for while using this medicine? You may need blood work while you are taking this medicine. What side effects may I notice from receiving this medicine? Side effects that you should report to your doctor or health care professional as soon as possible: -allergic reactions like skin rash, itching or hives, swelling of the face, lips, or tongue -signs and symptoms of infection like fever or chills; cough; sore throat; pain or trouble passing urine Side effects that usually do not require medical attention (report to your doctor or health care professional if they continue or are bothersome): -diarrhea -nausea -muscle pain -pain, redness, or irritation at site where injected This list may not describe all possible side effects. Call your doctor for medical advice about side effects. You may report side effects to FDA at 1-800-FDA-1088. Where should I keep my medicine? Keep out of  the reach of children. You will be instructed on how to store this medicine. Throw away any unused medicine after the expiration date on the label. NOTE: This sheet is a summary. It may not cover all possible information. If you have questions about this medicine, talk to your doctor, pharmacist, or health care provider.  2018 Elsevier/Gold Standard (2016-04-11 13:21:53)

## 2017-12-14 NOTE — Progress Notes (Signed)
12/15/2017 Guerry Minors Dec 15, 1940 [id number]   HPI:  [name] is a 77 y.o. male patient of Dr Percival Spanish, who presents today for a lipid clinic evaluation.  In addition to hyperlipidemia, his medical history is significant for hypertension, aortic valve stenosis and GERD.  He had a heart catheterization in 2011 which showed normal coronary arteries.    His most recent lipid labs indicate that he has a familial hyperlipidemia, with his LDL last August being at 213.  He has previously been intolerant to simvastatin and atorvastatin, but took pravastatin 80 mg without issue.  (this was 5+ years ago).  He has not had any statin drugs in at least the past 2 years.     Current Medications:  None  Cholesterol Goals:   LDL < 100  Intolerant/previously tried:  Pravastatin 80 mg - believes he did fine with this  Simvastatin - joint pain  Lipitor - joint pain  Family history:   Mother died at 12 - MI at 55  Father died from lung cancer  2 brothers- no issues  Son with high cholesterol -    Diet:   High protein in am (2 eggs, bacon), lunch is fruit/sandwich, afternoon/evening will go to [employer] ; some nuts/popcorn; cola maybe 3 times per week; no coffee/tea; otherwise water  Exercise:    Gym MWF tone work, no heavy weights, doesn't work up sweat, just moves  Labs:   12/2016: TC 276, TG 82, HDL 46.8, LDL 213  Current Outpatient Medications  Medication Sig Dispense Refill  . acetaminophen (TYLENOL) 500 MG tablet Take 500 mg by mouth every 8 (eight) hours as needed (for pain).     Marland Kitchen alfuzosin (UROXATRAL) 10 MG 24 hr tablet Take 1 tablet by mouth daily.    Marland Kitchen aspirin 81 MG tablet Take 81 mg by mouth daily.      . diclofenac (VOLTAREN) 75 MG EC tablet Take 1 tablet (75 mg total) by mouth 2 (two) times daily. 180 tablet 3  . esomeprazole (NEXIUM) 40 MG capsule TAKE 1 CAPSULE DAILY BEFOREBREAKFAST 90 capsule 3  . eszopiclone (LUNESTA) 2 MG TABS tablet TAKE 1 TABLET BY MOUTH AT  BEDTIME AS NEEDED FOR SLEEP 30 tablet 0  . finasteride (PROSCAR) 5 MG tablet TAKE 1 TABLET DAILY 90 tablet 3  . fluticasone (FLONASE) 50 MCG/ACT nasal spray Place 1 spray into both nostrils daily. (Patient not taking: Reported on 11/20/2017) 16 g 2  . metroNIDAZOLE (METROGEL) 0.75 % gel Apply 1 application topically 2 (two) times daily. 45 g 11  . pravastatin (PRAVACHOL) 20 MG tablet Take 1 tablet (20 mg total) by mouth every evening. 30 tablet 3   No current facility-administered medications for this visit.     Allergies  Allergen Reactions  . Anesthetics, Amide Nausea Only    Other reaction(s): Vomiting After surgery patient has problems with nausea and vomiting due to anesthetics  . Atorvastatin     REACTION: myalgias  . Oxycodone Nausea And Vomiting  . Simvastatin     REACTION: joint and stomach pain  . Latex Rash    Local area  . Statins Other (See Comments)    Joint pain    Past Medical History:  Diagnosis Date  . Allergy   . Anxiety   . Aortic stenosis   . BPH (benign prostatic hypertrophy)   . Diaphragm dysfunction    right frozen diaphragm  . Diverticulitis   . GERD (gastroesophageal reflux disease)   . Hyperlipidemia   .  Kidney stones   . Obstructive sleep apnea    CPAP  . Osteoarthritis   . Osteoporosis   . Sleep apnea    cpap nightly  . Spinal stenosis     There were no vitals taken for this visit.   Hyperlipemia Patient with familial hyperlipidemia currently with no medications.  He seems to think that he is not at high risk despite his elevated LDL, and is more concerned that his wife would benefit from [place].  The medication was discussed at length and patient is agreeable to try.  I will also start him back on a lower dose of pravastatin, 20 mg, to be sure he tolerates without myalgias.  He will get labs done later this week and we will submit paperwork for approval.     Tommy Medal PharmD CPP [place] [address] [place], [place] [address]

## 2017-12-15 ENCOUNTER — Other Ambulatory Visit: Payer: Self-pay | Admitting: Internal Medicine

## 2017-12-15 DIAGNOSIS — M47816 Spondylosis without myelopathy or radiculopathy, lumbar region: Secondary | ICD-10-CM | POA: Diagnosis not present

## 2017-12-15 DIAGNOSIS — M47812 Spondylosis without myelopathy or radiculopathy, cervical region: Secondary | ICD-10-CM | POA: Diagnosis not present

## 2017-12-15 DIAGNOSIS — M5116 Intervertebral disc disorders with radiculopathy, lumbar region: Secondary | ICD-10-CM | POA: Diagnosis not present

## 2017-12-15 DIAGNOSIS — M533 Sacrococcygeal disorders, not elsewhere classified: Secondary | ICD-10-CM | POA: Diagnosis not present

## 2017-12-15 DIAGNOSIS — M4150 Other secondary scoliosis, site unspecified: Secondary | ICD-10-CM | POA: Diagnosis not present

## 2017-12-15 DIAGNOSIS — M419 Scoliosis, unspecified: Secondary | ICD-10-CM | POA: Diagnosis not present

## 2017-12-15 NOTE — Telephone Encounter (Signed)
Last Rx 11/16/2017. Last OV 06/2017

## 2017-12-15 NOTE — Assessment & Plan Note (Signed)
Patient with familial hyperlipidemia currently with no medications.  He seems to think that he is not at high risk despite his elevated LDL, and is more concerned that his wife would benefit from [place].  The medication was discussed at length and patient is agreeable to try.  I will also start him back on a lower dose of pravastatin, 20 mg, to be sure he tolerates without myalgias.  He will get labs done later this week and we will submit paperwork for approval.

## 2017-12-18 ENCOUNTER — Telehealth: Payer: Self-pay | Admitting: *Deleted

## 2017-12-18 DIAGNOSIS — I7101 Dissection of ascending aorta: Secondary | ICD-10-CM

## 2017-12-18 DIAGNOSIS — I351 Nonrheumatic aortic (valve) insufficiency: Secondary | ICD-10-CM

## 2017-12-18 NOTE — Telephone Encounter (Signed)
Pt aware of his Echo, repeat Echo ordered for 1 year along with a CTA chest

## 2017-12-18 NOTE — Telephone Encounter (Signed)
-----   Message from Minus Breeding, MD sent at 12/17/2017 11:12 AM EDT ----- Moderate AI.  NL EF.  The aorta is 42cm.  I will follow the AI with repeat echo in one year and can follow the aorta with a CTA in one year.  Call Mr. Vogan with the results and send results to Venia Carbon, MD

## 2017-12-21 DIAGNOSIS — Z7982 Long term (current) use of aspirin: Secondary | ICD-10-CM | POA: Diagnosis not present

## 2017-12-21 DIAGNOSIS — I10 Essential (primary) hypertension: Secondary | ICD-10-CM | POA: Diagnosis not present

## 2017-12-21 DIAGNOSIS — Z96651 Presence of right artificial knee joint: Secondary | ICD-10-CM | POA: Insufficient documentation

## 2017-12-21 DIAGNOSIS — Z91040 Latex allergy status: Secondary | ICD-10-CM | POA: Diagnosis not present

## 2017-12-21 DIAGNOSIS — K5792 Diverticulitis of intestine, part unspecified, without perforation or abscess without bleeding: Secondary | ICD-10-CM | POA: Insufficient documentation

## 2017-12-21 DIAGNOSIS — R1032 Left lower quadrant pain: Secondary | ICD-10-CM | POA: Diagnosis present

## 2017-12-21 DIAGNOSIS — Z79899 Other long term (current) drug therapy: Secondary | ICD-10-CM | POA: Insufficient documentation

## 2017-12-21 DIAGNOSIS — K8020 Calculus of gallbladder without cholecystitis without obstruction: Secondary | ICD-10-CM | POA: Diagnosis not present

## 2017-12-22 ENCOUNTER — Encounter (HOSPITAL_COMMUNITY): Payer: Self-pay | Admitting: *Deleted

## 2017-12-22 ENCOUNTER — Emergency Department (HOSPITAL_COMMUNITY)
Admission: EM | Admit: 2017-12-22 | Discharge: 2017-12-22 | Disposition: A | Discharge status: Home / Self Care | Payer: Medicare Other | Attending: Emergency Medicine | Admitting: Emergency Medicine

## 2017-12-22 ENCOUNTER — Emergency Department (HOSPITAL_COMMUNITY): Payer: Medicare Other

## 2017-12-22 DIAGNOSIS — K5792 Diverticulitis of intestine, part unspecified, without perforation or abscess without bleeding: Secondary | ICD-10-CM | POA: Diagnosis not present

## 2017-12-22 DIAGNOSIS — K8020 Calculus of gallbladder without cholecystitis without obstruction: Secondary | ICD-10-CM | POA: Diagnosis not present

## 2017-12-22 LAB — BASIC METABOLIC PANEL
ANION GAP: 8 (ref 5–15)
BUN: 19 mg/dL (ref 8–23)
CHLORIDE: 105 mmol/L (ref 98–111)
CO2: 27 mmol/L (ref 22–32)
Calcium: 9 mg/dL (ref 8.9–10.3)
Creatinine, Ser: 0.98 mg/dL (ref 0.61–1.24)
GFR calc Af Amer: >60 mL/min (ref >60)
GLUCOSE: 120 mg/dL — AB (ref 70–99)
POTASSIUM: 3.8 mmol/L (ref 3.5–5.1)
Sodium: 140 mmol/L (ref 135–145)

## 2017-12-22 LAB — URINALYSIS, ROUTINE W REFLEX MICROSCOPIC
Bacteria, UA: NONE SEEN
Bilirubin Urine: NEGATIVE
Glucose, UA: NEGATIVE mg/dL
KETONES UR: NEGATIVE mg/dL
LEUKOCYTES UA: NEGATIVE
NITRITE: NEGATIVE
PROTEIN: NEGATIVE mg/dL
Specific Gravity, Urine: 1.015 (ref 1.005–1.030)
pH: 5 (ref 5.0–8.0)

## 2017-12-22 LAB — CBC
HEMATOCRIT: 38.8 % — AB (ref 39.0–52.0)
HEMOGLOBIN: 13.2 g/dL (ref 13.0–17.0)
MCH: 29.7 pg (ref 26.0–34.0)
MCHC: 34 g/dL (ref 30.0–36.0)
MCV: 87.4 fL (ref 78.0–100.0)
Platelets: 203 10*3/uL (ref 150–400)
RBC: 4.44 MIL/uL (ref 4.22–5.81)
RDW: 13.6 % (ref 11.5–15.5)
WBC: 16.1 10*3/uL — ABNORMAL HIGH (ref 4.0–10.5)

## 2017-12-22 MED ORDER — ONDANSETRON 8 MG PO TBDP: 8.0000 mg | ORAL_TABLET | Freq: Three times a day (TID) | ORAL | 0 refills | Status: DC | PRN

## 2017-12-22 MED ORDER — MORPHINE SULFATE (PF) 4 MG/ML IV SOLN
6.0000 mg | Freq: Once | INTRAVENOUS | Status: AC
  Administered 2017-12-22: 6 mg via INTRAVENOUS
  Filled 2017-12-22: qty 2

## 2017-12-22 MED ORDER — IOPAMIDOL (ISOVUE-300) INJECTION 61%
100.0000 mL | Freq: Once | INTRAVENOUS | Status: AC | PRN
  Administered 2017-12-22: 100 mL via INTRAVENOUS

## 2017-12-22 MED ORDER — CIPROFLOXACIN HCL 500 MG PO TABS: 500.0000 mg | ORAL_TABLET | Freq: Two times a day (BID) | ORAL | 0 refills | Status: DC

## 2017-12-22 MED ORDER — METRONIDAZOLE 500 MG PO TABS: 500.0000 mg | ORAL_TABLET | Freq: Two times a day (BID) | ORAL | 0 refills | Status: DC

## 2017-12-22 MED ORDER — HYDROCODONE-ACETAMINOPHEN 5-325 MG PO TABS
1.0000 | ORAL_TABLET | Freq: Once | ORAL | Status: AC
  Administered 2017-12-22: 1 via ORAL
  Filled 2017-12-22: qty 1

## 2017-12-22 MED ORDER — SODIUM CHLORIDE 0.9 % IV BOLUS
1000.0000 mL | Freq: Once | INTRAVENOUS | Status: AC
  Administered 2017-12-22: 1000 mL via INTRAVENOUS

## 2017-12-22 MED ORDER — ONDANSETRON HCL 4 MG/2ML IJ SOLN: 4.0000 mg | Freq: Four times a day (QID) | INTRAMUSCULAR | Status: DC | PRN

## 2017-12-22 MED ORDER — ONDANSETRON HCL 4 MG/2ML IJ SOLN
4.0000 mg | Freq: Once | INTRAMUSCULAR | Status: AC
  Administered 2017-12-22: 4 mg via INTRAVENOUS
  Filled 2017-12-22: qty 2

## 2017-12-22 MED ORDER — IOPAMIDOL (ISOVUE-300) INJECTION 61%
INTRAVENOUS | Status: AC
  Filled 2017-12-22: qty 100

## 2017-12-22 MED ORDER — HYDROCODONE-ACETAMINOPHEN 5-325 MG PO TABS: 1.0000 | ORAL_TABLET | ORAL | 0 refills | Status: DC | PRN

## 2017-12-22 MED ORDER — FENTANYL CITRATE (PF) 100 MCG/2ML IJ SOLN
50.0000 ug | INTRAMUSCULAR | Status: DC | PRN
  Administered 2017-12-22: 50 ug via INTRAVENOUS
  Filled 2017-12-22: qty 2

## 2017-12-22 MED ORDER — CIPROFLOXACIN HCL 500 MG PO TABS
500.0000 mg | ORAL_TABLET | Freq: Once | ORAL | Status: AC
  Administered 2017-12-22: 500 mg via ORAL
  Filled 2017-12-22: qty 1

## 2017-12-22 MED ORDER — IBUPROFEN 200 MG PO TABS
600.0000 mg | ORAL_TABLET | Freq: Once | ORAL | Status: AC
  Administered 2017-12-22: 600 mg via ORAL
  Filled 2017-12-22: qty 3

## 2017-12-22 MED ORDER — METRONIDAZOLE 500 MG PO TABS
500.0000 mg | ORAL_TABLET | Freq: Once | ORAL | Status: AC
  Administered 2017-12-22: 500 mg via ORAL
  Filled 2017-12-22: qty 1

## 2017-12-22 NOTE — ED Notes (Addendum)
1 set of blood cultures at bedside

## 2017-12-22 NOTE — ED Provider Notes (Signed)
[facility] DEPT Provider Note   CSN: [id number] Arrival date & time: 12/21/17  2302     History   Chief Complaint Chief Complaint  Patient presents with  . Flank Pain    HPI [name] is a 77 y.o. male.  HPI Patient is a 77 year old male presents the emergency department with left-sided worsening abdominal pain today with low-grade fevers.  Denies vomiting.  Reports some nausea.  Reports pain is worse with movement.  Does have a history of kidney stones but has no flank pain at this time.  No dysuria or urinary frequency.  Symptoms are moderate to severe in severity and worse with movement and palpation of the left side of his abdomen.  History of diverticulosis.  No prior history of diverticulitis.   Past Medical History:  Diagnosis Date  . Allergy   . Anxiety   . Aortic stenosis   . BPH (benign prostatic hypertrophy)   . Diaphragm dysfunction    right frozen diaphragm  . Diverticulitis   . GERD (gastroesophageal reflux disease)   . Hyperlipidemia   . Kidney stones   . Obstructive sleep apnea    CPAP  . Osteoarthritis   . Osteoporosis   . Sleep apnea    cpap nightly  . Spinal stenosis     Patient Active Problem List   Diagnosis Date Noted  . Aortic valve stenosis 11/20/2017  . Ascending aortic aneurysm ([place]) 11/20/2017  . Acute non-recurrent maxillary sinusitis 10/10/2017  . Fatigue 06/27/2017  . Rosacea 03/12/2015  . Advance directive discussed with patient 12/15/2014  . Arthritis of knee, degenerative 07/23/2014  . Obstructive sleep apnea   . Class 1 obesity 03/13/2014  . Benign fibroma of prostate 03/13/2014  . Lumbar radicular pain 09/12/2012  . Osteoarthritis, hand 10/20/2011  . Routine general medical examination at a health care facility 07/26/2011  . Osteoarthritis, multiple sites 07/10/2009  . Obesity 12/04/2008  . Essential hypertension 08/13/2008  . BARRETTS ESOPHAGUS 08/13/2008  . HIATAL HERNIA 08/13/2008   . BPH with obstruction/lower urinary tract symptoms 12/12/2007  . SPINAL STENOSIS, LUMBAR 09/21/2007  . Hyperlipemia 10/09/2006  . ALLERGIC RHINITIS 10/09/2006  . GERD 10/09/2006  . DIVERTICULOSIS, COLON 10/09/2006    Past Surgical History:  Procedure Laterality Date  . ESOPHAGOGASTRODUODENOSCOPY     colon--6/04, Barrett's--7/05, Barrett's but no cancer--4/08  . EYE MUSCLE SURGERY     L eye muscle surgery--2/01  . JOINT REPLACEMENT  1/12   left TKR  . KNEE ARTHROSCOPY     7/ 09 Arthroscopy left knee--Dr Noemi Chapel  . LIPOMA EXCISION Left 2010   compressing nerve by spine---[facility]erald Sleeper at [facility]  . ROTATOR CUFF REPAIR     R rotator cuff repair--1/00  Noemi Chapel)  . ROTATOR CUFF REPAIR Left ~2005 & 11/15   biceps repair also in 2015  . TENDON REPAIR     Left knee tendon repair--1966  . TOTAL KNEE ARTHROPLASTY Right 4/16  . VASECTOMY     Vasectomy/spermatocele/?testicular cyst--1970        Home Medications    Prior to Admission medications   Medication Sig Start Date End Date Taking? Authorizing Provider  acetaminophen (TYLENOL) 500 MG tablet Take 500 mg by mouth every 8 (eight) hours as needed (for pain).    Yes [provider]  alfuzosin (UROXATRAL) 10 MG 24 hr tablet Take 1 tablet by mouth daily. 06/26/17  Yes [provider]  aspirin 81 MG tablet Take 81 mg by mouth daily.  Yes [provider]  diclofenac (VOLTAREN) 75 MG EC tablet Take 1 tablet (75 mg total) by mouth 2 (two) times daily. 06/27/17  Yes Viviana Simpler I, MD  esomeprazole (NEXIUM) 40 MG capsule TAKE 1 CAPSULE DAILY BEFOREBREAKFAST 09/07/17  Yes Kasa, Maretta Bees, MD  eszopiclone (LUNESTA) 2 MG TABS tablet TAKE 1 TABLET BY MOUTH ONCE A DAY AT BEDTIME AS NEEDED FOR SLEEP 12/15/17  Yes Venia Carbon, MD  finasteride (PROSCAR) 5 MG tablet TAKE 1 TABLET DAILY 01/31/17  Yes Venia Carbon, MD  fluticasone (FLONASE) 50 MCG/ACT nasal spray Place 1 spray into both nostrils daily. 10/10/17 10/10/18 Yes  Burns, Claudina Lick, MD  metroNIDAZOLE (METROGEL) 0.75 % gel Apply 1 application topically 2 (two) times daily. 03/12/15  Yes Venia Carbon, MD  pravastatin (PRAVACHOL) 20 MG tablet Take 1 tablet (20 mg total) by mouth every evening. 12/14/17 03/14/18 Yes Minus Breeding, MD  ciprofloxacin (CIPRO) 500 MG tablet Take 1 tablet (500 mg total) by mouth 2 (two) times daily for 10 days. 12/22/17 01/01/18  Jola Schmidt, MD  HYDROcodone-acetaminophen (NORCO/VICODIN) 5-325 MG tablet Take 1 tablet by mouth every 4 (four) hours as needed for moderate pain. 12/22/17   Jola Schmidt, MD  metroNIDAZOLE (FLAGYL) 500 MG tablet Take 1 tablet (500 mg total) by mouth 2 (two) times daily for 10 days. 12/22/17 01/01/18  Jola Schmidt, MD  ondansetron (ZOFRAN ODT) 8 MG disintegrating tablet Take 1 tablet (8 mg total) by mouth every 8 (eight) hours as needed for nausea or vomiting. 12/22/17   Jola Schmidt, MD    Family History Family History  Problem Relation Age of Onset  . Hypertension Mother   . Breast cancer Mother   . Colon polyps Mother   . Coronary artery disease Mother   . Diabetes Neg Hx   . Colon cancer Neg Hx   . Esophageal cancer Neg Hx   . Gallbladder disease Neg Hx   . Kidney disease Neg Hx     Social History Social History   Tobacco Use  . Smoking status: Never Smoker  . Smokeless tobacco: Never Used  Substance Use Topics  . Alcohol use: Yes    Alcohol/week: 0.0 standard drinks    Comment: occassional wine  . Drug use: No     Allergies   Anesthetics, amide; Atorvastatin; Oxycodone; Simvastatin; Latex; and Statins   Review of Systems Review of Systems  All other systems reviewed and are negative.    Physical Exam Updated Vital Signs BP 130/82   Pulse (!) 108   Temp (!) 100.8 F (38.2 C) (Oral)   Resp (!) 33   SpO2 93%   Physical Exam  Constitutional: He is oriented to person, place, and time. He appears well-developed and well-nourished.  HENT:  Head: Normocephalic and  atraumatic.  Eyes: EOM are normal.  Neck: Normal range of motion.  Cardiovascular: Normal rate, regular rhythm and normal heart sounds.  Pulmonary/Chest: Effort normal and breath sounds normal. No respiratory distress.  Abdominal: Soft. He exhibits no distension.  Left-sided abdominal tenderness without guarding or rebound.  Musculoskeletal: Normal range of motion.  Neurological: He is alert and oriented to person, place, and time.  Skin: Skin is warm and dry.  Psychiatric: He has a normal mood and affect. Judgment normal.  Nursing note and vitals reviewed.    ED Treatments / Results  Labs (all labs ordered are listed, but only abnormal results are displayed) Labs Reviewed  URINALYSIS, ROUTINE W REFLEX MICROSCOPIC - Abnormal;  Notable for the following components:      Result Value   Hgb urine dipstick SMALL (*)    All other components within normal limits  BASIC METABOLIC PANEL - Abnormal; Notable for the following components:   Glucose, Bld 120 (*)    All other components within normal limits  CBC - Abnormal; Notable for the following components:   WBC 16.1 (*)    HCT 38.8 (*)    All other components within normal limits  URINE CULTURE    EKG None  Radiology Ct Abdomen Pelvis W Contrast  Result Date: 12/22/2017 CLINICAL DATA:  Initial evaluation for acute left flank pain. EXAM: CT ABDOMEN AND PELVIS WITH CONTRAST TECHNIQUE: Multidetector CT imaging of the abdomen and pelvis was performed using the standard protocol following bolus administration of intravenous contrast. CONTRAST:  139mL ISOVUE-300 IOPAMIDOL (ISOVUE-300) INJECTION 61% COMPARISON:  None available. FINDINGS: Lower chest: Elevation of the right hemidiaphragm with associated right basilar atelectasis. Mild scattered atelectatic changes within the left lung base as well. Visualized lungs are otherwise clear. Hepatobiliary: Liver within normal limits. Layering hyperdensity within the gallbladder suspicious for  stones. No evidence for acute cholecystitis. No biliary dilatation. Pancreas: Pancreas within normal limits. Spleen: Spleen within normal limits. Adrenals/Urinary Tract: Adrenal glands are normal. Kidneys demonstrate symmetric enhancement. Right kidney atrophic. No nephrolithiasis, hydronephrosis, or focal enhancing renal mass. No hydroureter. Bladder within normal limits. Stomach/Bowel: Stomach within normal limits. No evidence for bowel obstruction hazy inflammatory stranding involving several colonic diverticula within the left lower quadrant, consistent with acute diverticulitis. No evidence for perforation or other complication. No other acute inflammatory changes seen about the bowels. 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 No aneurysm. Mesenteric vessels patent proximally. No adenopathy. Reproductive: Enlarged prostate with median lobe hypertrophy. Other: No free air or fluid. Musculoskeletal: No acute osseus abnormality. No worrisome lytic or blastic osseous lesions. Left convex lumbar scoliosis with advanced multilevel degenerative spondylolysis. IMPRESSION: 1. Findings consistent with acute colonic diverticulitis. No evidence for perforation or other complication. 2. Cholelithiasis. 3. 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 Electronically Signed   By: Jeannine Boga M.D.   On: 12/22/2017 04:48    Procedures Procedures (including critical care time)  Medications Ordered in ED Medications  fentaNYL (SUBLIMAZE) injection 50 mcg (50 mcg Intravenous Given 12/22/17 0207)  ondansetron (ZOFRAN) injection 4 mg (has no administration in time range)  iopamidol (ISOVUE-300) 61 % injection (has no administration in time range)  HYDROcodone-acetaminophen (NORCO/VICODIN) 5-325 MG per tablet 1 tablet (has no administration in time range)  ondansetron (ZOFRAN) injection 4 mg (4 mg Intravenous Given 12/22/17 0202)  sodium chloride 0.9 % bolus  1,000 mL (0 mLs Intravenous Stopped 12/22/17 0534)  iopamidol (ISOVUE-300) 61 % injection 100 mL (100 mLs Intravenous Contrast Given 12/22/17 0351)  ciprofloxacin (CIPRO) tablet 500 mg (500 mg Oral Given 12/22/17 0532)  metroNIDAZOLE (FLAGYL) tablet 500 mg (500 mg Oral Given 12/22/17 0532)  morphine 4 MG/ML injection 6 mg (6 mg Intravenous Given 12/22/17 0549)     Initial Impression / Assessment and Plan / ED Course  I have reviewed the triage vital signs and the nursing notes.  Pertinent labs & imaging results that were available during my care of the patient were reviewed by me and considered in my medical decision making (see chart for details).     Acute uncomplicated diverticulitis.  Cipro and Flagyl given here in the emergency department.  Patient be discharged home with antibiotics and nausea and pain medication.  He understands return to the ER for new or worsening symptoms.  He understands that despite outpatient antibiotics there is a small percentage of patients that will go onto a complicating diverticulitis.  He understands that if this were the case and his symptoms were to worsen he will need to come back to this emergency department for further evaluation.  Final Clinical Impressions(s) / ED Diagnoses   Final diagnoses:  Acute diverticulitis    ED Discharge Orders         Ordered    ciprofloxacin (CIPRO) 500 MG tablet  2 times daily     12/22/17 0623    metroNIDAZOLE (FLAGYL) 500 MG tablet  2 times daily     12/22/17 0623    ondansetron (ZOFRAN ODT) 8 MG disintegrating tablet  Every 8 hours PRN     12/22/17 0623    HYDROcodone-acetaminophen (NORCO/VICODIN) 5-325 MG tablet  Every 4 hours PRN     12/22/17 5320           Jola Schmidt, MD 12/22/17 [phone]

## 2017-12-22 NOTE — ED Triage Notes (Signed)
Pt c/o left sided flank and abdominal pain, reports remote hx of kidney stones, also c/o nausea. Pt sts pain is worse with movement.

## 2017-12-22 NOTE — ED Notes (Signed)
Patient transported to CT 

## 2017-12-23 LAB — URINE CULTURE: Culture: NO GROWTH

## 2017-12-25 ENCOUNTER — Encounter: Payer: Self-pay | Admitting: Internal Medicine

## 2017-12-25 ENCOUNTER — Ambulatory Visit (INDEPENDENT_AMBULATORY_CARE_PROVIDER_SITE_OTHER): Payer: Medicare Other | Admitting: Internal Medicine

## 2017-12-25 VITALS — BP 124/80 | HR 82 | Temp 98.1°F | Ht 66.5 in | Wt 214.0 lb

## 2017-12-25 DIAGNOSIS — K5792 Diverticulitis of intestine, part unspecified, without perforation or abscess without bleeding: Secondary | ICD-10-CM | POA: Diagnosis not present

## 2017-12-25 DIAGNOSIS — I700 Atherosclerosis of aorta: Secondary | ICD-10-CM | POA: Diagnosis not present

## 2017-12-25 NOTE — Progress Notes (Signed)
Subjective:    Patient ID: [name], male    DOB: 1940/06/07, 77 y.o.   MRN: [id number]  HPI Here for ER follow up "I am so tired" Got sick 8/15---spent all night at the ER Bad LLQ pain---he though he had a kidney stone CT scan showed diverticulitis Also seen was aortic atherosclerosis  Told to stick to liquid diet till follow up appt Has started some soft food today (scrambled eggs) without a problem  No fever now Pain is better---still has some soreness in that area Took some tylenol  Current Outpatient Medications on File Prior to Visit  Medication Sig Dispense Refill  . acetaminophen (TYLENOL) 500 MG tablet Take 500 mg by mouth every 8 (eight) hours as needed (for pain).     Marland Kitchen alfuzosin (UROXATRAL) 10 MG 24 hr tablet Take 1 tablet by mouth daily.    Marland Kitchen aspirin 81 MG tablet Take 81 mg by mouth daily.      . ciprofloxacin (CIPRO) 500 MG tablet Take 1 tablet (500 mg total) by mouth 2 (two) times daily for 10 days. 20 tablet 0  . diclofenac (VOLTAREN) 75 MG EC tablet Take 1 tablet (75 mg total) by mouth 2 (two) times daily. 180 tablet 3  . esomeprazole (NEXIUM) 40 MG capsule TAKE 1 CAPSULE DAILY BEFOREBREAKFAST 90 capsule 3  . eszopiclone (LUNESTA) 2 MG TABS tablet TAKE 1 TABLET BY MOUTH ONCE A DAY AT BEDTIME AS NEEDED FOR SLEEP 30 tablet 0  . finasteride (PROSCAR) 5 MG tablet TAKE 1 TABLET DAILY 90 tablet 3  . fluticasone (FLONASE) 50 MCG/ACT nasal spray Place 1 spray into both nostrils daily. 16 g 2  . HYDROcodone-acetaminophen (NORCO/VICODIN) 5-325 MG tablet Take 1 tablet by mouth every 4 (four) hours as needed for moderate pain. 15 tablet 0  . metroNIDAZOLE (FLAGYL) 500 MG tablet Take 1 tablet (500 mg total) by mouth 2 (two) times daily for 10 days. 20 tablet 0  . metroNIDAZOLE (METROGEL) 0.75 % gel Apply 1 application topically 2 (two) times daily. 45 g 11  . ondansetron (ZOFRAN ODT) 8 MG disintegrating tablet Take 1 tablet (8 mg total) by mouth every 8 (eight) hours as  needed for nausea or vomiting. 10 tablet 0  . pravastatin (PRAVACHOL) 20 MG tablet Take 1 tablet (20 mg total) by mouth every evening. 30 tablet 3   No current facility-administered medications on file prior to visit.     Allergies  Allergen Reactions  . Anesthetics, Amide Nausea Only    Other reaction(s): Vomiting After surgery patient has problems with nausea and vomiting due to anesthetics  . Atorvastatin     REACTION: myalgias  . Oxycodone Nausea And Vomiting  . Simvastatin     REACTION: joint and stomach pain  . Latex Rash    Local area  . Statins Other (See Comments)    Joint pain    Past Medical History:  Diagnosis Date  . Allergy   . Anxiety   . Aortic stenosis   . BPH (benign prostatic hypertrophy)   . Diaphragm dysfunction    right frozen diaphragm  . Diverticulitis   . GERD (gastroesophageal reflux disease)   . Hyperlipidemia   . Kidney stones   . Obstructive sleep apnea    CPAP  . Osteoarthritis   . Osteoporosis   . Sleep apnea    cpap nightly  . Spinal stenosis     Past Surgical History:  Procedure Laterality Date  . ESOPHAGOGASTRODUODENOSCOPY  colon--6/04, Barrett's--7/05, Barrett's but no cancer--4/08  . EYE MUSCLE SURGERY     L eye muscle surgery--2/01  . JOINT REPLACEMENT  1/12   left TKR  . KNEE ARTHROSCOPY     7/ 09 Arthroscopy left knee--Dr Noemi Chapel  . LIPOMA EXCISION Left 2010   compressing nerve by spine---Dr Terald Sleeper at [facility]  . ROTATOR CUFF REPAIR     R rotator cuff repair--1/00  Noemi Chapel)  . ROTATOR CUFF REPAIR Left ~2005 & 11/15   biceps repair also in 2015  . TENDON REPAIR     Left knee tendon repair--1966  . TOTAL KNEE ARTHROPLASTY Right 4/16  . VASECTOMY     Vasectomy/spermatocele/?testicular cyst--1970    Family History  Problem Relation Age of Onset  . Hypertension Mother   . Breast cancer Mother   . Colon polyps Mother   . Coronary artery disease Mother   . Diabetes Neg Hx   . Colon cancer Neg Hx   . Esophageal  cancer Neg Hx   . Gallbladder disease Neg Hx   . Kidney disease Neg Hx     Social History   Socioeconomic History  . Marital status: Married    Spouse name: Not on file  . Number of children: 3  . Years of education: Not on file  . Highest education level: Not on file  Occupational History  . Occupation: retired    Fish farm manager: RETIRED  Social Needs  . Financial resource strain: Not on file  . Food insecurity:    Worry: Not on file    Inability: Not on file  . Transportation needs:    Medical: Not on file    Non-medical: Not on file  Tobacco Use  . Smoking status: Never Smoker  . Smokeless tobacco: Never Used  Substance and Sexual Activity  . Alcohol use: Yes    Alcohol/week: 0.0 standard drinks    Comment: occassional wine  . Drug use: No  . Sexual activity: Not on file  Lifestyle  . Physical activity:    Days per week: Not on file    Minutes per session: Not on file  . Stress: Not on file  Relationships  . Social connections:    Talks on phone: Not on file    Gets together: Not on file    Attends religious service: Not on file    Active member of club or organization: Not on file    Attends meetings of clubs or organizations: Not on file    Relationship status: Not on file  . Intimate partner violence:    Fear of current or ex partner: Not on file    Emotionally abused: Not on file    Physically abused: Not on file    Forced sexual activity: Not on file  Other Topics Concern  . Not on file  Social History Narrative   Lives at home with wife      Has living will   Wife is health care POA   Would accept resuscitation attempts   No tube feeds if cognitively unaware         Review of Systems  Took 3 stool softeners last night---felt "impacted" Good BM this morning--feels better Did have slight blood specks this morning (in the bowl) No cough or SOB Tolerating the antibiotics  Also on probiotic Feels bloated     Objective:   Physical Exam    Constitutional: He appears well-developed. No distress.  Neck: No thyromegaly present.  Respiratory: Effort normal. No  respiratory distress. He has no wheezes.  Dullness bilaterally with slight decreased breath sounds on right  GI: Soft. Bowel sounds are normal. He exhibits no distension and no mass. There is no rebound and no guarding.  Slight LLQ tenderness only  Lymphadenopathy:    He has no cervical adenopathy.           Assessment & Plan:

## 2017-12-25 NOTE — Patient Instructions (Signed)
If you are feeling pretty much back to normal by Wednesday, stop the antibiotics after 7 days (last dose Friday morning).

## 2017-12-25 NOTE — Assessment & Plan Note (Signed)
Seen on CT scan No change in Rx--ASA and statin

## 2017-12-25 NOTE — Assessment & Plan Note (Signed)
Much better Starting soft foods today---okay to advance If resolved by 8/21, will stop antibiotics after 7 days

## 2017-12-26 DIAGNOSIS — E785 Hyperlipidemia, unspecified: Secondary | ICD-10-CM | POA: Diagnosis not present

## 2017-12-27 LAB — LIPID PANEL
Chol/HDL Ratio: 3.6 ratio (ref 0.0–5.0)
Cholesterol, Total: 163 mg/dL (ref 100–199)
HDL: 45 mg/dL (ref >39)
LDL Calculated: 109 mg/dL — ABNORMAL HIGH (ref 0–99)
Triglycerides: 45 mg/dL (ref 0–149)
VLDL Cholesterol Cal: 9 mg/dL (ref 5–40)

## 2017-12-29 ENCOUNTER — Encounter: Payer: Medicare Other | Admitting: Internal Medicine

## 2018-01-01 ENCOUNTER — Encounter: Payer: Self-pay | Admitting: Family Medicine

## 2018-01-01 ENCOUNTER — Ambulatory Visit (INDEPENDENT_AMBULATORY_CARE_PROVIDER_SITE_OTHER): Payer: Medicare Other | Admitting: Family Medicine

## 2018-01-01 VITALS — BP 120/84 | HR 68 | Temp 98.3°F | Ht 66.5 in | Wt 212.8 lb

## 2018-01-01 DIAGNOSIS — R350 Frequency of micturition: Secondary | ICD-10-CM | POA: Diagnosis not present

## 2018-01-01 DIAGNOSIS — R34 Anuria and oliguria: Secondary | ICD-10-CM | POA: Diagnosis not present

## 2018-01-01 DIAGNOSIS — B354 Tinea corporis: Secondary | ICD-10-CM | POA: Diagnosis not present

## 2018-01-01 LAB — POC URINALSYSI DIPSTICK (AUTOMATED)
BILIRUBIN UA: NEGATIVE
Blood, UA: NEGATIVE
GLUCOSE UA: NEGATIVE
Ketones, UA: NEGATIVE
Leukocytes, UA: NEGATIVE
Nitrite, UA: NEGATIVE
Protein, UA: NEGATIVE
SPEC GRAV UA: 1.01 (ref 1.010–1.025)
Urobilinogen, UA: 0.2 E.U./dL
pH, UA: 7 (ref 5.0–8.0)

## 2018-01-01 MED ORDER — BACLOFEN 10 MG PO TABS: 5.0000 mg | ORAL_TABLET | Freq: Three times a day (TID) | ORAL | 1 refills | Status: DC

## 2018-01-01 NOTE — Progress Notes (Signed)
Dr. Frederico Hamman T. Rachelle Edwards, MD, [facility] [place], [address] Phone: [phone] Fax: [phone]  01/01/2018  Patient: [name], MRN: [id number], DOB: 1940-06-16, 77 y.o.  Primary Physician:  Venia Carbon, MD   Chief Complaint  Patient presents with  . Trouble Urinating    Recently seen in ER for Diverticulitis-Stopped treatment due to side effects   Subjective:   YANIXAN MELLINGER is a 77 y.o. very pleasant male patient who presents with the following:  Took 7 days of cipro and flagyl.  Pain left on Saturday and Sunday. Had some high pressure with relieving himself. Urinating every 30-45 minutes. Had been drinking a lot of water.   Took some finasteride last night.  Took some uroxatral early in the morning.  7 AM had been a lot better.  He denies taking any Sudafed or cold medicine recently.  He is not having any pain or discharge or any possible STD exposure.   Past Medical History, Surgical History, Social History, Family History, Problem List, Medications, and Allergies have been reviewed and updated if relevant.  Patient Active Problem List   Diagnosis Date Noted  . Acute diverticulitis 12/25/2017  . Aortic atherosclerosis ([place]) 12/25/2017  . Aortic valve stenosis 11/20/2017  . Ascending aortic aneurysm ([place]) 11/20/2017  . Acute non-recurrent maxillary sinusitis 10/10/2017  . Fatigue 06/27/2017  . Rosacea 03/12/2015  . Advance directive discussed with patient 12/15/2014  . Arthritis of knee, degenerative 07/23/2014  . Obstructive sleep apnea   . Class 1 obesity 03/13/2014  . Benign fibroma of prostate 03/13/2014  . Lumbar radicular pain 09/12/2012  . Osteoarthritis, hand 10/20/2011  . Routine general medical examination at a health care facility 07/26/2011  . Osteoarthritis, multiple sites 07/10/2009  . Obesity 12/04/2008  . Essential hypertension 08/13/2008  . BARRETTS ESOPHAGUS  08/13/2008  . HIATAL HERNIA 08/13/2008  . BPH with obstruction/lower urinary tract symptoms 12/12/2007  . SPINAL STENOSIS, LUMBAR 09/21/2007  . Hyperlipemia 10/09/2006  . ALLERGIC RHINITIS 10/09/2006  . GERD 10/09/2006  . DIVERTICULOSIS, COLON 10/09/2006    Past Medical History:  Diagnosis Date  . Allergy   . Anxiety   . Aortic stenosis   . BPH (benign prostatic hypertrophy)   . Diaphragm dysfunction    right frozen diaphragm  . Diverticulitis   . GERD (gastroesophageal reflux disease)   . Hyperlipidemia   . Kidney stones   . Obstructive sleep apnea    CPAP  . Osteoarthritis   . Osteoporosis   . Sleep apnea    cpap nightly  . Spinal stenosis     Past Surgical History:  Procedure Laterality Date  . ESOPHAGOGASTRODUODENOSCOPY     colon--6/04, Barrett's--7/05, Barrett's but no cancer--4/08  . EYE MUSCLE SURGERY     L eye muscle surgery--2/01  . JOINT REPLACEMENT  1/12   left TKR  . KNEE ARTHROSCOPY     7/ 09 Arthroscopy left knee--Dr Noemi Chapel  . LIPOMA EXCISION Left 2010   compressing nerve by spine---[facility] at [facility]  . ROTATOR CUFF REPAIR     R rotator cuff repair--1/00  Noemi Chapel)  . ROTATOR CUFF REPAIR Left ~2005 & 11/15   biceps repair also in 2015  . TENDON REPAIR     Left knee tendon repair--1966  . TOTAL KNEE ARTHROPLASTY Right 4/16  . VASECTOMY     Vasectomy/spermatocele/?testicular cyst--1970    Social History   Socioeconomic History  . Marital status: Married  Spouse name: Not on file  . Number of children: 3  . Years of education: Not on file  . Highest education level: Not on file  Occupational History  . Occupation: retired    Fish farm manager: RETIRED  Social Needs  . Financial resource strain: Not on file  . Food insecurity:    Worry: Not on file    Inability: Not on file  . Transportation needs:    Medical: Not on file    Non-medical: Not on file  Tobacco Use  . Smoking status: Never Smoker  . Smokeless tobacco: Never Used  Substance  and Sexual Activity  . Alcohol use: Yes    Alcohol/week: 0.0 standard drinks    Comment: occassional wine  . Drug use: No  . Sexual activity: Not on file  Lifestyle  . Physical activity:    Days per week: Not on file    Minutes per session: Not on file  . Stress: Not on file  Relationships  . Social connections:    Talks on phone: Not on file    Gets together: Not on file    Attends religious service: Not on file    Active member of club or organization: Not on file    Attends meetings of clubs or organizations: Not on file    Relationship status: Not on file  . Intimate partner violence:    Fear of current or ex partner: Not on file    Emotionally abused: Not on file    Physically abused: Not on file    Forced sexual activity: Not on file  Other Topics Concern  . Not on file  Social History Narrative   Lives at home with wife      Has living will   Wife is health care POA   Would accept resuscitation attempts   No tube feeds if cognitively unaware          Family History  Problem Relation Age of Onset  . Hypertension Mother   . Breast cancer Mother   . Colon polyps Mother   . Coronary artery disease Mother   . Diabetes Neg Hx   . Colon cancer Neg Hx   . Esophageal cancer Neg Hx   . Gallbladder disease Neg Hx   . Kidney disease Neg Hx     Allergies  Allergen Reactions  . Anesthetics, Amide Nausea Only    Other reaction(s): Vomiting After surgery patient has problems with nausea and vomiting due to anesthetics  . Atorvastatin     REACTION: myalgias  . Oxycodone Nausea And Vomiting  . Simvastatin     REACTION: joint and stomach pain  . Latex Rash    Local area  . Statins Other (See Comments)    Joint pain    Medication list reviewed and updated in full in [facility].  ROS: GEN: Acute illness details above GI: Tolerating PO intake GU: maintaining adequate hydration and urination Pulm: No SOB Interactive and getting along well at  home.  Otherwise, ROS is as per the HPI.  Objective:   BP 120/84   Pulse 68   Temp 98.3 F (36.8 C) (Oral)   Ht 5' 6.5" (1.689 m)   Wt 212 lb 12 oz (96.5 kg)   BMI 33.82 kg/m   GEN: WDWN, NAD, Non-toxic, A & O x 3 HEENT: Atraumatic, Normocephalic. Neck supple. No masses, No LAD. Ears and Nose: No external deformity. CV: RRR, No M/G/R. No JVD. No thrill. No extra  heart sounds. PULM: CTA B, no wheezes, crackles, rhonchi. No retractions. No resp. distress. No accessory muscle use. ABD: S, NT, ND, +BS. No rebound. No HSM. EXTR: No c/c/e NEURO Normal gait.  PSYCH: Normally interactive. Conversant. Not depressed or anxious appearing.  Calm demeanor.     Laboratory and Imaging Data: Results for orders placed or performed in visit on 01/01/18  POCT Urinalysis Dipstick (Automated)  Result Value Ref Range   Color, UA Yellow    Clarity, UA Clear    Glucose, UA Negative Negative   Bilirubin, UA Negative    Ketones, UA Negative    Spec Grav, UA 1.010 1.010 - 1.025   Blood, UA Negative    pH, UA 7.0 5.0 - 8.0   Protein, UA Negative Negative   Urobilinogen, UA 0.2 0.2 or 1.0 E.U./dL   Nitrite, UA Negative    Leukocytes, UA Negative Negative     Assessment and Plan:   Increased frequency of micturition  Decreased urine output - Plan: POCT Urinalysis Dipstick (Automated)   Increased urinary frequency, this is already gotten better after he took his dose of Uroxatrol.  He is already taking the maximum dose for this, and he is also taking some Proscar.  He thinks that this may relate to either his Cipro or Flagyl usage, but I can find no reported events of this.  If he has recurrence of diverticulitis, I would use Augmentin.  We will do a trial of some baclofen for a few days to see if this helps with some spasm.  Follow-up: No follow-ups on file.  Meds ordered this encounter  Medications  . baclofen (LIORESAL) 10 MG tablet    Sig: Take 0.5 tablets (5 mg total) by mouth 3  (three) times daily.    Dispense:  45 each    Refill:  1   Orders Placed This Encounter  Procedures  . POCT Urinalysis Dipstick (Automated)    Signed,  Arish Redner T. Laniesha Das, MD   Allergies as of 01/01/2018      Reactions   Anesthetics, Amide Nausea Only   Other reaction(s): Vomiting After surgery patient has problems with nausea and vomiting due to anesthetics   Atorvastatin    REACTION: myalgias   Oxycodone Nausea And Vomiting   Simvastatin    REACTION: joint and stomach pain   Latex Rash   Local area   Statins Other (See Comments)   Joint pain      Medication List        Accurate as of 01/01/18 11:59 PM. Always use your most recent med list.          acetaminophen 500 MG tablet Commonly known as:  TYLENOL Take 500 mg by mouth every 8 (eight) hours as needed (for pain).   alfuzosin 10 MG 24 hr tablet Commonly known as:  UROXATRAL Take 1 tablet by mouth daily.   aspirin 81 MG tablet Take 81 mg by mouth daily.   baclofen 10 MG tablet Commonly known as:  LIORESAL Take 0.5 tablets (5 mg total) by mouth 3 (three) times daily.   diclofenac 75 MG EC tablet Commonly known as:  VOLTAREN Take 1 tablet (75 mg total) by mouth 2 (two) times daily.   esomeprazole 40 MG capsule Commonly known as:  NEXIUM TAKE 1 CAPSULE DAILY BEFOREBREAKFAST   eszopiclone 2 MG Tabs tablet Commonly known as:  LUNESTA TAKE 1 TABLET BY MOUTH ONCE A DAY AT BEDTIME AS NEEDED FOR SLEEP   finasteride 5 MG  tablet Commonly known as:  PROSCAR TAKE 1 TABLET DAILY   fluticasone 50 MCG/ACT nasal spray Commonly known as:  FLONASE Place 1 spray into both nostrils daily.   pravastatin 20 MG tablet Commonly known as:  PRAVACHOL Take 1 tablet (20 mg total) by mouth every evening.

## 2018-01-02 ENCOUNTER — Telehealth: Payer: Self-pay | Admitting: Pharmacist Clinician (PhC)/ Clinical Pharmacy Specialist

## 2018-01-02 MED ORDER — PRAVASTATIN SODIUM 40 MG PO TABS: 40.0000 mg | ORAL_TABLET | Freq: Every evening | ORAL | 3 refills | Status: DC

## 2018-01-02 NOTE — Telephone Encounter (Signed)
-----   Message from Minus Breeding, MD sent at 12/31/2017  9:25 PM EDT ----- LDL is not bad.  I would suggest a higher dose of Pravachol 40 mg daily.  Patient is followed in [facility].  I will forward.

## 2018-01-02 NOTE — Telephone Encounter (Signed)
Spoke with patient, will increase pravastatin to 40 mg daily.  Patient voiced understanding.

## 2018-01-03 DIAGNOSIS — M47816 Spondylosis without myelopathy or radiculopathy, lumbar region: Secondary | ICD-10-CM | POA: Diagnosis not present

## 2018-01-12 ENCOUNTER — Telehealth: Payer: Self-pay | Admitting: Pharmacist Clinician (PhC)/ Clinical Pharmacy Specialist

## 2018-01-12 NOTE — Telephone Encounter (Signed)
Insurance approved Praluent to 01/11/2019.  Called patient, but he would like to increase pravastatin to 40 mg daily for 2-3 months and see if he can get to goal with that.   Will put Praluent on hold for now, repeat labs in 2-3 months and go from there.  Patient agreeable to plan.

## 2018-01-15 ENCOUNTER — Other Ambulatory Visit: Payer: Self-pay | Admitting: Internal Medicine

## 2018-01-15 NOTE — Telephone Encounter (Signed)
Last filled 12-15-17 Last OV 01-01-18 Next OV 01-24-18

## 2018-01-16 ENCOUNTER — Encounter: Payer: Self-pay | Admitting: Physician Assistant

## 2018-01-16 ENCOUNTER — Ambulatory Visit (INDEPENDENT_AMBULATORY_CARE_PROVIDER_SITE_OTHER): Payer: Medicare Other | Admitting: Physician Assistant

## 2018-01-16 VITALS — BP 124/60 | HR 72 | Ht 66.5 in | Wt 213.0 lb

## 2018-01-16 DIAGNOSIS — Z86010 Personal history of colonic polyps: Secondary | ICD-10-CM | POA: Diagnosis not present

## 2018-01-16 DIAGNOSIS — K5792 Diverticulitis of intestine, part unspecified, without perforation or abscess without bleeding: Secondary | ICD-10-CM

## 2018-01-16 NOTE — Progress Notes (Signed)
I agree with the above note, plan 

## 2018-01-16 NOTE — Patient Instructions (Addendum)
We have given you a high fiber diet handout. Please strive to have 25-30 grams of fiber daily.   Your provider suggest that you drink more water. Try to have at least 6-8 8 oz glasses of water daily.    Try Miralax once daily if needed. Stop stool softener.

## 2018-01-16 NOTE — Progress Notes (Signed)
Chief Complaint: Follow-up ER visit for diverticulitis  HPI:    [name] is a 77 year old male with a past medical history as listed below, follows with Dr. Ardis Johnston and who was referred to me by Arthur Carbon, MD for follow-up after recently being seen in the ER for acute diverticulitis.      12/16/2014 colonoscopy revealed a sessile polyp in the sigmoid colon, mild diverticulosis in the left colon otherwise normal exam.  Pathology showed adenomatous polyp.  It was recommended he have a repeat colonoscopy in 5 years.    12/22/2017 patient presented to the ER with left-sided worsening abdominal pain and low-grade fevers.  Labs at that time showed a CBC with an elevated white count at 16.1, BMP and urinalysis which were normal.  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  Patient was given Ciprofloxacin 500 mg twice daily and Flagyl 500 mg twice daily and discharged home.    Today, patient tells me that when he had onset of symptoms he was experiencing a thinner stool and was somewhat constipated.  Also had left flank pain and was "sure that I had a kidney stone".  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  After using the antibiotics prescribed in the ER all of his symptoms went away and he is now doing much better.  Patient has started a fiber supplement in the morning and also a stool softener at night but tells me that this does make his stool somewhat loose and "messy".  He does ask some questions regarding his history of polyps in relation to diverticulitis.    Denies fever, chills, weight loss, blood in his stool, continued abdominal pain, nausea or vomiting.  Past Medical History:  Diagnosis Date  . Allergy   . Anxiety     . Aortic stenosis   . BPH (benign prostatic hypertrophy)   . Diaphragm dysfunction    right frozen diaphragm  . Diverticulitis   . GERD (gastroesophageal reflux disease)   . Hyperlipidemia   . Kidney stones   . Obstructive sleep apnea    CPAP  . Osteoarthritis   . Osteoporosis   . Sleep apnea    cpap nightly  . Spinal stenosis     Past Surgical History:  Procedure Laterality Date  . ESOPHAGOGASTRODUODENOSCOPY     colon--6/04, Barrett's--7/05, Barrett's but no cancer--4/08  . EYE MUSCLE SURGERY     L eye muscle surgery--2/01  . JOINT REPLACEMENT  1/12   left TKR  . KNEE ARTHROSCOPY     7/ 09 Arthroscopy left knee--Dr Noemi Chapel  . LIPOMA EXCISION Left 2010   compressing nerve by spine---[facility] Terald Sleeper [facility]  . ROTATOR CUFF REPAIR     R rotator cuff repair--1/00  Noemi Chapel)  . ROTATOR CUFF REPAIR Left ~2005 & 11/15   biceps repair also in 2015  . TENDON REPAIR     Left knee tendon repair--1966  . TOTAL KNEE ARTHROPLASTY Right 4/16  . VASECTOMY     Vasectomy/spermatocele/?testicular cyst--1970    Current Outpatient Medications  Medication Sig Dispense Refill  . acetaminophen (TYLENOL) 500 MG tablet Take 500 mg by mouth every 8 (eight) hours as needed (for pain).     Marland Kitchen alfuzosin (UROXATRAL) 10 MG 24 hr tablet Take  1 tablet by mouth daily.    Marland Kitchen aspirin 81 MG tablet Take 81 mg by mouth daily.      . baclofen (LIORESAL) 10 MG tablet Take 0.5 tablets (5 mg total) by mouth 3 (three) times daily. 45 each 1  . diclofenac (VOLTAREN) 75 MG EC tablet Take 1 tablet (75 mg total) by mouth 2 (two) times daily. 180 tablet 3  . esomeprazole (NEXIUM) 40 MG capsule TAKE 1 CAPSULE DAILY BEFOREBREAKFAST 90 capsule 3  . eszopiclone (LUNESTA) 2 MG TABS tablet TAKE 1 TABLET BY MOUTH ONCE A DAY AT BEDTIME AS NEEDED FOR SLEEP 30 tablet 0  . finasteride (PROSCAR) 5 MG tablet TAKE 1 TABLET DAILY 90 tablet 3  . fluticasone (FLONASE) 50 MCG/ACT nasal spray Place 1 spray into both nostrils daily. 16 g 2   . pravastatin (PRAVACHOL) 40 MG tablet Take 1 tablet (40 mg total) by mouth every evening. 90 tablet 3   No current facility-administered medications for this visit.     Allergies as of 01/16/2018 - Review Complete 01/01/2018  Allergen Reaction Noted  . Anesthetics, amide Nausea Only 12/18/2013  . Atorvastatin  04/21/2006  . Oxycodone Nausea And Vomiting 10/28/2014  . Simvastatin  04/21/2006  . Latex Rash 04/07/2014  . Statins Other (See Comments) 11/17/2014    Family History  Problem Relation Age of Onset  . Hypertension Mother   . Breast cancer Mother   . Colon polyps Mother   . Coronary artery disease Mother   . Diabetes Neg Hx   . Colon cancer Neg Hx   . Esophageal cancer Neg Hx   . Gallbladder disease Neg Hx   . Kidney disease Neg Hx     Social History   Socioeconomic History  . Marital status: Married    Spouse name: Not on file  . Number of children: 3  . Years of education: Not on file  . Highest education level: Not on file  Occupational History  . Occupation: retired    Fish farm manager: RETIRED  Social Needs  . Financial resource strain: Not on file  . Food insecurity:    Worry: Not on file    Inability: Not on file  . Transportation needs:    Medical: Not on file    Non-medical: Not on file  Tobacco Use  . Smoking status: Never Smoker  . Smokeless tobacco: Never Used  Substance and Sexual Activity  . Alcohol use: Yes    Alcohol/week: 0.0 standard drinks    Comment: occassional wine  . Drug use: No  . Sexual activity: Not on file  Lifestyle  . Physical activity:    Days per week: Not on file    Minutes per session: Not on file  . Stress: Not on file  Relationships  . Social connections:    Talks on phone: Not on file    Gets together: Not on file    Attends religious service: Not on file    Active member of club or organization: Not on file    Attends meetings of clubs or organizations: Not on file    Relationship status: Not on file  .  Intimate partner violence:    Fear of current or ex partner: Not on file    Emotionally abused: Not on file    Physically abused: Not on file    Forced sexual activity: Not on file  Other Topics Concern  . Not on file  Social History Narrative   Lives at home with  wife      Has living will   Wife is health care POA   Would accept resuscitation attempts   No tube feeds if cognitively unaware          Review of Systems:    Constitutional: No weight loss, fever or chills Skin: No rash  Cardiovascular: No chest pain Respiratory: No SOB Gastrointestinal: See HPI and otherwise negative Genitourinary: No dysuria  Neurological: No headache, dizziness or syncope Musculoskeletal: No new muscle or joint pain Hematologic: No bleeding  Psychiatric: No history of depression or anxiety   Physical Exam:  Vital signs: BP 124/60   Pulse 72   Ht 5' 6.5" (1.689 m)   Wt 213 lb (96.6 kg)   BMI 33.86 kg/m   Constitutional:   Pleasant Caucasian male appears to be in NAD, Well developed, Well nourished, alert and cooperative Head:  Normocephalic and atraumatic. Eyes:   PEERL, EOMI. No icterus. Conjunctiva pink. Ears:  Normal auditory acuity. Neck:  Supple Throat: Oral cavity and pharynx without inflammation, swelling or lesion.  Respiratory: Respirations even and unlabored. Lungs clear to auscultation bilaterally.   No wheezes, crackles, or rhonchi.  Cardiovascular: Normal S1, S2. No MRG. Regular rate and rhythm. No peripheral edema, cyanosis or pallor.  Gastrointestinal:  Soft, nondistended, nontender. No rebound or guarding. Normal bowel sounds. No appreciable masses or hepatomegaly. Rectal:  Not performed.  Msk:  Symmetrical without gross deformities. Without edema, no deformity or joint abnormality.  Neurologic:  Alert and  oriented x4;  grossly normal neurologically.  Skin:   Dry and intact without significant lesions or rashes. Psychiatric: Demonstrates good judgement and reason  without abnormal affect or behaviors.  RELEVANT LABS AND IMAGING: CBC    Component Value Date/Time   WBC 16.1 (H) 12/22/2017 0101   RBC 4.44 12/22/2017 0101   HGB 13.2 12/22/2017 0101   HCT 38.8 (L) 12/22/2017 0101   PLT 203 12/22/2017 0101   MCV 87.4 12/22/2017 0101   MCH 29.7 12/22/2017 0101   MCHC 34.0 12/22/2017 0101   RDW 13.6 12/22/2017 0101   LYMPHSABS 1.7 12/28/2016 1051   MONOABS 0.7 12/28/2016 1051   EOSABS 0.4 12/28/2016 1051   BASOSABS 0.1 12/28/2016 1051    CMP     Component Value Date/Time   NA 140 12/22/2017 0101   K 3.8 12/22/2017 0101   CL 105 12/22/2017 0101   CO2 27 12/22/2017 0101   GLUCOSE 120 (H) 12/22/2017 0101   BUN 19 12/22/2017 0101   CREATININE 0.98 12/22/2017 0101   CALCIUM 9.0 12/22/2017 0101   PROT 6.4 06/27/2017 0852   ALBUMIN 4.0 06/27/2017 0852   AST 17 06/27/2017 0852   ALT 14 06/27/2017 0852   ALKPHOS 38 (L) 06/27/2017 0852   BILITOT 1.1 06/27/2017 0852   GFRNONAA >60 12/22/2017 0101   GFRAA >60 12/22/2017 0101    Assessment: 1.  Acute diverticulitis: Evidenced by CT and elevated white count as well as left lower quadrant pain, resolved after Cipro and Flagyl, per PCPs notes did have some questionable side effects including decreased urine output while on these antibiotics, he recommended Augmentin if patient did have another flare of diverticulitis in the future 2.  History of adenomatous polyps: Last colonoscopy in 2016 with recommendations for repeat in 5 years  Plan: 1.  Reviewed the pathophysiology of diverticulitis with the patient.  Discussed that to try and prevent further episodes he should remain well-hydrated and increase fiber in his diet to at least 25-35 g/day  with use of a fiber supplement and/or through his diet. 2.  Explained to the patient that he should discontinue his stool softeners as they are making his stool soft and messy.  If he feels he needs something to assist with constipation would recommend MiraLAX  instead. 3.  Discussed with patient that his next colonoscopy is due in 5 years.  His history of adenomatous polyps does not have anything to do with his episode of diverticulitis.  He does have a known history of diverticulosis, explained pathophysiology of this. 4.  Patient to follow in clinic as needed before time of his next colonoscopy in 2021 with Dr. Ardis Johnston or myself.  Ellouise Newer, PA-C [facility] 01/16/2018, 1:16 PM  Cc: Arthur Carbon, MD

## 2018-01-17 DIAGNOSIS — G5603 Carpal tunnel syndrome, bilateral upper limbs: Secondary | ICD-10-CM | POA: Diagnosis not present

## 2018-01-23 DIAGNOSIS — G5603 Carpal tunnel syndrome, bilateral upper limbs: Secondary | ICD-10-CM | POA: Diagnosis not present

## 2018-01-24 ENCOUNTER — Ambulatory Visit (INDEPENDENT_AMBULATORY_CARE_PROVIDER_SITE_OTHER): Payer: Medicare Other | Admitting: Internal Medicine

## 2018-01-24 ENCOUNTER — Encounter: Payer: Self-pay | Admitting: Internal Medicine

## 2018-01-24 VITALS — BP 118/70 | HR 67 | Temp 97.7°F | Ht 67.0 in | Wt 210.0 lb

## 2018-01-24 DIAGNOSIS — N138 Other obstructive and reflux uropathy: Secondary | ICD-10-CM

## 2018-01-24 DIAGNOSIS — N401 Enlarged prostate with lower urinary tract symptoms: Secondary | ICD-10-CM

## 2018-01-24 DIAGNOSIS — Z7189 Other specified counseling: Secondary | ICD-10-CM

## 2018-01-24 DIAGNOSIS — I10 Essential (primary) hypertension: Secondary | ICD-10-CM

## 2018-01-24 DIAGNOSIS — K5790 Diverticulosis of intestine, part unspecified, without perforation or abscess without bleeding: Secondary | ICD-10-CM

## 2018-01-24 DIAGNOSIS — Z0000 Encounter for general adult medical examination without abnormal findings: Secondary | ICD-10-CM | POA: Diagnosis not present

## 2018-01-24 DIAGNOSIS — I700 Atherosclerosis of aorta: Secondary | ICD-10-CM | POA: Diagnosis not present

## 2018-01-24 DIAGNOSIS — M48061 Spinal stenosis, lumbar region without neurogenic claudication: Secondary | ICD-10-CM

## 2018-01-24 NOTE — Progress Notes (Signed)
Hearing Screening   125Hz  250Hz  500Hz  1000Hz  2000Hz  3000Hz  4000Hz  6000Hz  8000Hz   Right ear:   20 20 20   0    Left ear:   20 20 20  25     Vision Screening Comments: November 2018

## 2018-01-24 NOTE — Assessment & Plan Note (Signed)
I have personally reviewed the Medicare Annual Wellness questionnaire and have noted 1. The patient's medical and social history 2. Their use of alcohol, tobacco or illicit drugs 3. Their current medications and supplements 4. The patient's functional ability including ADL's, fall risks, home safety risks and hearing or visual             impairment. 5. Diet and physical activities 6. Evidence for depression or mood disorders  The patients weight, height, BMI and visual acuity have been recorded in the chart I have made referrals, counseling and provided education to the patient based review of the above and I have provided the pt with a written personalized care plan for preventive services.  I have provided you with a copy of your personalized plan for preventive services. Please take the time to review along with your updated medication list.  Prefers no flu vaccine Will be due for colon 2021 No more PSA Discussed fitness

## 2018-01-24 NOTE — Assessment & Plan Note (Signed)
BP Readings from Last 3 Encounters:  01/24/18 118/70  01/16/18 124/60  01/01/18 120/84   Good control Recent labs fine

## 2018-01-24 NOTE — Assessment & Plan Note (Signed)
Does okay on the dual therapy

## 2018-01-24 NOTE — Assessment & Plan Note (Signed)
Discussed avoiding constipation May actually want to cut back on fiber Use the miralax regularly

## 2018-01-24 NOTE — Assessment & Plan Note (Signed)
Getting injections from pain clinic at [facility]

## 2018-01-24 NOTE — Progress Notes (Signed)
Subjective:    Patient ID: [name], male    DOB: 1940/07/07, 77 y.o.   MRN: [id number]  HPI Here for Medicare wellness visit and follow up of chronic health conditions Reviewed form and advanced directives Reviewed other doctors No alcohol or tobacco Does try to exercise regularly Vision and hearing are okay. Early cataract though No falls No depression or anhedonia Independent with instrumental ADLs No sig memory issues  Having problems with constipation Has increased fiber dude to the diverticulitis--and more constipated  Did try stool softener Did start miralax yesterday---discussed regimen  History of Barrett's No regular heartburn or dysphagia on the nexium  Continues on the dual therapy for the BPH Stopped the doxazosin for a bit--with recent illness Did notice some change in flow Back on both now with improvement  Having some trouble with hand pain Went back to Dr Modena Morrow about his shoulder Some numbness especially in left arm---had EMG Diagnosed with CTS Planning on surgery soon for this  No chest pain or SOB No dizziness or syncope No palpitations No edema Did have recheck on aorta---known atherosclerosis and aneurysm Is on statin now  Current Outpatient Medications on File Prior to Visit  Medication Sig Dispense Refill  . acetaminophen (TYLENOL) 500 MG tablet Take 500 mg by mouth every 8 (eight) hours as needed (for pain).     Marland Kitchen alfuzosin (UROXATRAL) 10 MG 24 hr tablet Take 1 tablet by mouth daily.    Marland Kitchen aspirin 81 MG tablet Take 81 mg by mouth daily.      . diclofenac (VOLTAREN) 75 MG EC tablet Take 1 tablet (75 mg total) by mouth 2 (two) times daily. 180 tablet 3  . doxazosin (CARDURA) 2 MG tablet Take 2 mg by mouth daily.    Marland Kitchen esomeprazole (NEXIUM) 40 MG capsule TAKE 1 CAPSULE DAILY BEFOREBREAKFAST 90 capsule 3  . eszopiclone (LUNESTA) 2 MG TABS tablet TAKE 1 TABLET BY MOUTH ONCE A DAY AT BEDTIME AS NEEDED FOR SLEEP 30 tablet 0  . finasteride  (PROSCAR) 5 MG tablet TAKE 1 TABLET DAILY 90 tablet 3  . fluticasone (FLONASE) 50 MCG/ACT nasal spray Place 1 spray into both nostrils daily. 16 g 2  . pravastatin (PRAVACHOL) 40 MG tablet Take 1 tablet (40 mg total) by mouth every evening. 90 tablet 3   No current facility-administered medications on file prior to visit.     Allergies  Allergen Reactions  . Anesthetics, Amide Nausea Only    Other reaction(s): Vomiting After surgery patient has problems with nausea and vomiting due to anesthetics  . Atorvastatin     REACTION: myalgias  . Oxycodone Nausea And Vomiting  . Simvastatin     REACTION: joint and stomach pain  . Latex Rash    Local area  . Statins Other (See Comments)    Joint pain    Past Medical History:  Diagnosis Date  . Allergy   . Anxiety   . Aortic stenosis   . BPH (benign prostatic hypertrophy)   . Diaphragm dysfunction    right frozen diaphragm  . Diverticulitis   . GERD (gastroesophageal reflux disease)   . Hyperlipidemia   . Kidney stones   . Obstructive sleep apnea    CPAP  . Osteoarthritis   . Osteoporosis   . Sleep apnea    cpap nightly  . Spinal stenosis     Past Surgical History:  Procedure Laterality Date  . ESOPHAGOGASTRODUODENOSCOPY     colon--6/04, Barrett's--7/05, Barrett's but no  cancer--4/08  . EYE MUSCLE SURGERY     L eye muscle surgery--2/01  . JOINT REPLACEMENT  1/12   left TKR  . KNEE ARTHROSCOPY     7/ 09 Arthroscopy left knee--Dr Noemi Chapel  . LIPOMA EXCISION Left 2010   compressing nerve by spine---Dr [facility]erald [facility]le[facility] at [facility]  . ROTATOR CUFF REPAIR     R rotator cuff repair--1/00  Noemi Chapel)  . ROTATOR CUFF REPAIR Left ~2005 & 11/15   biceps repair also in 2015  . TENDON REPAIR     Left knee tendon repair--1966  . TOTAL KNEE ARTHROPLASTY Right 4/16  . VASECTOMY     Vasectomy/spermatocele/?testicular cyst--1970    Family History  Problem Relation Age of Onset  . Hypertension Mother   . Breast cancer Mother   . Colon polyps  Mother   . Coronary artery disease Mother   . Diabetes Neg Hx   . Colon cancer Neg Hx   . Esophageal cancer Neg Hx   . Gallbladder disease Neg Hx   . Kidney disease Neg Hx     Social History   Socioeconomic History  . Marital status: Married    Spouse name: Not on file  . Number of children: 3  . Years of education: Not on file  . Highest education level: Not on file  Occupational History  . Occupation: retired    Fish farm manager: RETIRED  Social Needs  . Financial resource strain: Not on file  . Food insecurity:    Worry: Not on file    Inability: Not on file  . Transportation needs:    Medical: Not on file    Non-medical: Not on file  Tobacco Use  . Smoking status: Never Smoker  . Smokeless tobacco: Never Used  Substance and Sexual Activity  . Alcohol use: Yes    Alcohol/week: 0.0 standard drinks    Comment: occassional wine  . Drug use: No  . Sexual activity: Not on file  Lifestyle  . Physical activity:    Days per week: Not on file    Minutes per session: Not on file  . Stress: Not on file  Relationships  . Social connections:    Talks on phone: Not on file    Gets together: Not on file    Attends religious service: Not on file    Active member of club or organization: Not on file    Attends meetings of clubs or organizations: Not on file    Relationship status: Not on file  . Intimate partner violence:    Fear of current or ex partner: Not on file    Emotionally abused: Not on file    Physically abused: Not on file    Forced sexual activity: Not on file  Other Topics Concern  . Not on file  Social History Narrative   Lives at home with wife      Has living will   Wife is health care POA---alternate daughter [name]   Would accept resuscitation attempts   No tube feeds if cognitively unaware         Review of Systems Appetite is okay--but is "slowing down" (light meal in evening now) Has lost a few pounds since last year Sleeps fine with the  lunesta---doesn't sleep if he doesn't take it Takes afternoon nap and feels great after it Continues to use BiPap nightly with success. "This is my best friend" Wears seat belt Teeth okay---keeps up with dentist No rash or skin ulcers  Objective:   Physical Exam  Constitutional: He is oriented to person, place, and time. He appears well-developed. No distress.  HENT:  Mouth/Throat: Oropharynx is clear and moist. No oropharyngeal exudate.  Neck: No thyromegaly present.  Cardiovascular: Normal rate, regular rhythm, normal heart sounds and intact distal pulses. Exam reveals no gallop.  No murmur heard. Respiratory: Effort normal and breath sounds normal. No respiratory distress. He has no wheezes. He has no rales.  GI: Soft. There is no tenderness.  Musculoskeletal: He exhibits no edema or tenderness.  Lymphadenopathy:    He has no cervical adenopathy.  Neurological: He is alert and oriented to person, place, and time.  President--- "Trump, Obama, Clinton---Bush" [phone] [id number] Recall 3/3  Skin: No rash noted. No erythema.  Psychiatric: He has a normal mood and affect. His behavior is normal.           Assessment & Plan:

## 2018-01-24 NOTE — Assessment & Plan Note (Signed)
See social history 

## 2018-01-24 NOTE — Assessment & Plan Note (Signed)
Now on statin per cardiologist

## 2018-02-10 DIAGNOSIS — M545 Low back pain: Secondary | ICD-10-CM | POA: Diagnosis not present

## 2018-02-10 DIAGNOSIS — E785 Hyperlipidemia, unspecified: Secondary | ICD-10-CM | POA: Diagnosis not present

## 2018-02-10 DIAGNOSIS — N390 Urinary tract infection, site not specified: Secondary | ICD-10-CM | POA: Diagnosis not present

## 2018-02-10 DIAGNOSIS — E871 Hypo-osmolality and hyponatremia: Secondary | ICD-10-CM | POA: Diagnosis not present

## 2018-02-10 DIAGNOSIS — R509 Fever, unspecified: Secondary | ICD-10-CM | POA: Diagnosis not present

## 2018-02-10 DIAGNOSIS — K5792 Diverticulitis of intestine, part unspecified, without perforation or abscess without bleeding: Secondary | ICD-10-CM | POA: Diagnosis not present

## 2018-02-10 DIAGNOSIS — N401 Enlarged prostate with lower urinary tract symptoms: Secondary | ICD-10-CM | POA: Diagnosis not present

## 2018-02-10 DIAGNOSIS — K219 Gastro-esophageal reflux disease without esophagitis: Secondary | ICD-10-CM | POA: Diagnosis not present

## 2018-02-10 DIAGNOSIS — R1084 Generalized abdominal pain: Secondary | ICD-10-CM | POA: Diagnosis not present

## 2018-02-10 DIAGNOSIS — B9620 Unspecified Escherichia coli [E. coli] as the cause of diseases classified elsewhere: Secondary | ICD-10-CM | POA: Diagnosis not present

## 2018-02-10 DIAGNOSIS — N138 Other obstructive and reflux uropathy: Secondary | ICD-10-CM | POA: Diagnosis not present

## 2018-02-10 DIAGNOSIS — K8020 Calculus of gallbladder without cholecystitis without obstruction: Secondary | ICD-10-CM | POA: Diagnosis not present

## 2018-02-11 DIAGNOSIS — K219 Gastro-esophageal reflux disease without esophagitis: Secondary | ICD-10-CM | POA: Diagnosis not present

## 2018-02-11 DIAGNOSIS — E785 Hyperlipidemia, unspecified: Secondary | ICD-10-CM | POA: Diagnosis not present

## 2018-02-11 DIAGNOSIS — N390 Urinary tract infection, site not specified: Secondary | ICD-10-CM | POA: Diagnosis not present

## 2018-02-11 DIAGNOSIS — N401 Enlarged prostate with lower urinary tract symptoms: Secondary | ICD-10-CM | POA: Diagnosis not present

## 2018-02-11 DIAGNOSIS — N138 Other obstructive and reflux uropathy: Secondary | ICD-10-CM | POA: Diagnosis not present

## 2018-02-11 DIAGNOSIS — E871 Hypo-osmolality and hyponatremia: Secondary | ICD-10-CM | POA: Diagnosis not present

## 2018-02-11 DIAGNOSIS — B9620 Unspecified Escherichia coli [E. coli] as the cause of diseases classified elsewhere: Secondary | ICD-10-CM | POA: Diagnosis not present

## 2018-02-11 DIAGNOSIS — K5792 Diverticulitis of intestine, part unspecified, without perforation or abscess without bleeding: Secondary | ICD-10-CM | POA: Diagnosis not present

## 2018-02-11 DIAGNOSIS — K5732 Diverticulitis of large intestine without perforation or abscess without bleeding: Secondary | ICD-10-CM | POA: Diagnosis not present

## 2018-02-12 DIAGNOSIS — E871 Hypo-osmolality and hyponatremia: Secondary | ICD-10-CM | POA: Diagnosis not present

## 2018-02-12 DIAGNOSIS — E785 Hyperlipidemia, unspecified: Secondary | ICD-10-CM | POA: Diagnosis not present

## 2018-02-12 DIAGNOSIS — B9620 Unspecified Escherichia coli [E. coli] as the cause of diseases classified elsewhere: Secondary | ICD-10-CM | POA: Diagnosis not present

## 2018-02-12 DIAGNOSIS — N138 Other obstructive and reflux uropathy: Secondary | ICD-10-CM | POA: Diagnosis not present

## 2018-02-12 DIAGNOSIS — K5792 Diverticulitis of intestine, part unspecified, without perforation or abscess without bleeding: Secondary | ICD-10-CM | POA: Diagnosis not present

## 2018-02-12 DIAGNOSIS — N401 Enlarged prostate with lower urinary tract symptoms: Secondary | ICD-10-CM | POA: Diagnosis not present

## 2018-02-12 DIAGNOSIS — N390 Urinary tract infection, site not specified: Secondary | ICD-10-CM | POA: Diagnosis not present

## 2018-02-12 DIAGNOSIS — K219 Gastro-esophageal reflux disease without esophagitis: Secondary | ICD-10-CM | POA: Diagnosis not present

## 2018-02-14 DIAGNOSIS — N410 Acute prostatitis: Secondary | ICD-10-CM | POA: Diagnosis not present

## 2018-02-14 DIAGNOSIS — R3914 Feeling of incomplete bladder emptying: Secondary | ICD-10-CM | POA: Diagnosis not present

## 2018-02-15 DIAGNOSIS — M1811 Unilateral primary osteoarthritis of first carpometacarpal joint, right hand: Secondary | ICD-10-CM | POA: Diagnosis not present

## 2018-02-15 DIAGNOSIS — G5603 Carpal tunnel syndrome, bilateral upper limbs: Secondary | ICD-10-CM | POA: Diagnosis not present

## 2018-02-16 ENCOUNTER — Other Ambulatory Visit: Payer: Self-pay | Admitting: Internal Medicine

## 2018-02-16 ENCOUNTER — Telehealth: Payer: Self-pay | Admitting: Physician Assistant

## 2018-02-16 MED ORDER — FINASTERIDE 5 MG PO TABS: 5.0000 mg | ORAL_TABLET | Freq: Every day | ORAL | 3 refills | Status: DC

## 2018-02-16 NOTE — Telephone Encounter (Signed)
Is this the same CT scan he had done in the ER? I saw him for his appt after this time and he was doing better, it was reviewed at time of his visit. Has he continued to do well? If so then yes, he can follow up with Dr. Ardis Hughs prn. If he is discussing a separate Ct scan I do not have another one.  Thanks-JLL

## 2018-02-16 NOTE — Telephone Encounter (Signed)
Patient is requesting refill of medication- please review for refill

## 2018-02-16 NOTE — Telephone Encounter (Signed)
Pt last seen 01/24/18 for annual; proscar 5 mg refilled per protocol to [facility]. Pt notified done and voiced understanding.

## 2018-02-16 NOTE — Telephone Encounter (Signed)
[name] the pt would like you to review the 8/16 CT scan his PCP ordered for your opinion.  He was treated for diverticulitis.  Does he just need follow up with Dr Ardis Hughs in the future.

## 2018-02-16 NOTE — Telephone Encounter (Signed)
Copied from [place] [phone] [id number]. Topic: Quick Communication - Rx Refill/Question >> Feb 16, 2018 10:31 AM Yvette Rack wrote: Medication: finasteride (PROSCAR) 5 MG tablet   Has the patient contacted their pharmacy? yes   Preferred Pharmacy (with phone number or street name): [place], [place] - [address] [phone] (Phone) [phone] (Fax)  Agent: Please be advised that RX refills may take up to 3 business days. We ask that you follow-up with your pharmacy.

## 2018-02-16 NOTE — Telephone Encounter (Signed)
The pt had a CT last weekend in [place] and was told the right side of the colon had "air" around it.  I asked that he bring a copy of the report to our office for review.  An appt was made to see Dr Ardis Hughs as well for 11/26, this can be changed if needed after reviewing the CT.

## 2018-02-19 NOTE — Telephone Encounter (Signed)
Last office visit 01/24/2018 for CPE. Next Appt:  02/01/2019 for [place].   Last refilled 01/15/2018 for #30 with no refills.  Ok to refill?

## 2018-02-20 ENCOUNTER — Telehealth: Payer: Self-pay | Admitting: *Deleted

## 2018-02-20 ENCOUNTER — Other Ambulatory Visit: Payer: Self-pay | Admitting: Internal Medicine

## 2018-02-20 NOTE — Telephone Encounter (Signed)
Copied from [place] [id number]. Topic: Quick Communication - Rx Refill/Question >> Feb 20, 2018 11:12 AM Carolyn Stare wrote: Medication  eszopiclone (LUNESTA) 2 MG TABS tablet  Has the patient contacted their pharmacy yes    Preferred [place]: Please be advised that RX refills may take up to 3 business days. We ask that you follow-up with your pharmacy.

## 2018-02-20 NOTE — Telephone Encounter (Signed)
Order already pending

## 2018-02-23 ENCOUNTER — Telehealth: Payer: Self-pay | Admitting: Gastroenterology

## 2018-02-23 NOTE — Telephone Encounter (Signed)
The pt has been advised and will keep appt for 11/26 and call if pain returns

## 2018-02-23 NOTE — Telephone Encounter (Signed)
I reviewed a packet of information from a hospital stay in [place] February 12, 2018.  It appears that he presented with some abdominal pain he had a slightly elevated white count.  Labs showed he had a urinary tract infection.  CT scan impression "gas along the non-dependent a sending colonic wall which may represent gas surrounding stool however pneumatosis cannot be excluded.  Minimal inflammatory changes surrounding a diverticulum in the descending colon in the left lower quadrant may represent early changes of diverticulitis.  Foley catheter in the bladder circumferential bladder wall thickening.  Cholelithiasis.  He was put on antibiotics for a urinary tract infection.  I read a note from the hospitalist stating that they requested a general surgery consult but I cannot see that said consult was ever performed.  There are no notes from surgery or no notes about a surgery consult except that one was requested.  It does state in the hospitalist note that his "abdomen was benign"   Patty, Can you please let him know that I reviewed a packet of information from his emergency room, hospital stay recently in [place].  I think his care was completely appropriate.  I understand he is seeing me next month.  He should continue with that visit but he should call here sooner if he has significant abdominal pains again.  Albin Fischer

## 2018-02-28 DIAGNOSIS — N401 Enlarged prostate with lower urinary tract symptoms: Secondary | ICD-10-CM | POA: Diagnosis not present

## 2018-02-28 DIAGNOSIS — R351 Nocturia: Secondary | ICD-10-CM | POA: Diagnosis not present

## 2018-02-28 DIAGNOSIS — N410 Acute prostatitis: Secondary | ICD-10-CM | POA: Diagnosis not present

## 2018-03-19 DIAGNOSIS — M5416 Radiculopathy, lumbar region: Secondary | ICD-10-CM | POA: Diagnosis not present

## 2018-03-19 DIAGNOSIS — M47816 Spondylosis without myelopathy or radiculopathy, lumbar region: Secondary | ICD-10-CM | POA: Diagnosis not present

## 2018-03-19 DIAGNOSIS — M533 Sacrococcygeal disorders, not elsewhere classified: Secondary | ICD-10-CM | POA: Diagnosis not present

## 2018-03-20 ENCOUNTER — Other Ambulatory Visit: Payer: Self-pay | Admitting: Internal Medicine

## 2018-03-21 NOTE — Telephone Encounter (Signed)
Last filled 02-20-18 #30 Last OV 01-24-18 Next OV 02-01-19

## 2018-03-22 DIAGNOSIS — R310 Gross hematuria: Secondary | ICD-10-CM | POA: Diagnosis not present

## 2018-03-22 DIAGNOSIS — N410 Acute prostatitis: Secondary | ICD-10-CM | POA: Diagnosis not present

## 2018-03-26 DIAGNOSIS — M1811 Unilateral primary osteoarthritis of first carpometacarpal joint, right hand: Secondary | ICD-10-CM | POA: Diagnosis not present

## 2018-03-26 DIAGNOSIS — M1812 Unilateral primary osteoarthritis of first carpometacarpal joint, left hand: Secondary | ICD-10-CM | POA: Diagnosis not present

## 2018-03-26 DIAGNOSIS — G5602 Carpal tunnel syndrome, left upper limb: Secondary | ICD-10-CM | POA: Diagnosis not present

## 2018-03-26 DIAGNOSIS — G5601 Carpal tunnel syndrome, right upper limb: Secondary | ICD-10-CM | POA: Diagnosis not present

## 2018-03-28 DIAGNOSIS — H2513 Age-related nuclear cataract, bilateral: Secondary | ICD-10-CM | POA: Diagnosis not present

## 2018-03-28 DIAGNOSIS — M533 Sacrococcygeal disorders, not elsewhere classified: Secondary | ICD-10-CM | POA: Diagnosis not present

## 2018-04-03 ENCOUNTER — Encounter (INDEPENDENT_AMBULATORY_CARE_PROVIDER_SITE_OTHER): Payer: Self-pay

## 2018-04-03 ENCOUNTER — Ambulatory Visit (INDEPENDENT_AMBULATORY_CARE_PROVIDER_SITE_OTHER): Payer: Medicare Other | Admitting: Gastroenterology

## 2018-04-03 ENCOUNTER — Encounter: Payer: Self-pay | Admitting: Gastroenterology

## 2018-04-03 VITALS — BP 120/64 | HR 68 | Ht 68.0 in | Wt 216.5 lb

## 2018-04-03 DIAGNOSIS — K5792 Diverticulitis of intestine, part unspecified, without perforation or abscess without bleeding: Secondary | ICD-10-CM

## 2018-04-03 NOTE — Progress Notes (Signed)
Review of pertinent gastrointestinal problems: 1. Adenomatous colon poylps: 12/16/2014 colonoscopy revealed a sessile polyp in the sigmoid colon, mild diverticulosis in the left colon otherwise normal exam.  Pathology showed adenomatous polyp.  It was recommended he have a repeat colonoscopy in 5 years. 2. Recurrent(?) left sided colon diverticulitis: 12/2017 CT scan locally +for left colon 'acute diverticulitis' (also low grade fever, WBC 16k, left sided abd pain, normal UA), 02/2018 CT scan (in [place] presented with abd pain, found to have UTI) "gas along the non-dependent a sending colonic wall which may represent gas surrounding stool however pneumatosis cannot be excluded.  Minimal inflammatory changes surrounding a diverticulum in the descending colon in the left lower quadrant may represent early changes of diverticulitis"  HPI: This is a very pleasant 77 year old man who was here in our office about 3 months ago.  He had pretty obvious mild acute sigmoid diverticulitis based on presentation, labs and CT scan. 3 weeks afterwards he was in [place] and he had some abdominal pains.  He was found to have a UTI.  CT scan in [place], see above.Marland Kitchen  Has been having constipation troubles since these episodes.  But they have definitely improved since. He's been drinking more water, eating more fiber and bowels are more normal now.  Rarely small amount of blood on the TP.  No significant weight loss.  He does not have a family history of colon cancer  Chief complaint is diverticulitis  ROS: complete GI ROS as described in HPI, all other review negative.  Constitutional:  No unintentional weight loss   Past Medical History:  Diagnosis Date  . Allergy   . Anxiety   . Aortic stenosis   . BPH (benign prostatic hypertrophy)   . Diaphragm dysfunction    right frozen diaphragm  . Diverticulitis   . GERD (gastroesophageal reflux disease)   . Hyperlipidemia   . Kidney stones   . Obstructive sleep  apnea    CPAP  . Osteoarthritis   . Osteoporosis   . Sleep apnea    cpap nightly  . Spinal stenosis     Past Surgical History:  Procedure Laterality Date  . ESOPHAGOGASTRODUODENOSCOPY     colon--6/04, Barrett's--7/05, Barrett's but no cancer--4/08  . EYE MUSCLE SURGERY     L eye muscle surgery--2/01  . JOINT REPLACEMENT  1/12   left TKR  . KNEE ARTHROSCOPY     7/ 09 Arthroscopy left knee--Dr Noemi Chapel  . LIPOMA EXCISION Left 2010   compressing nerve by spine---[facility]erald [facility]le[facility] at [facility]  . ROTATOR CUFF REPAIR     R rotator cuff repair--1/00  Noemi Chapel)  . ROTATOR CUFF REPAIR Left ~2005 & 11/15   biceps repair also in 2015  . TENDON REPAIR     Left knee tendon repair--1966  . TOTAL KNEE ARTHROPLASTY Right 4/16  . VASECTOMY     Vasectomy/spermatocele/?testicular cyst--1970    Current Outpatient Medications  Medication Sig Dispense Refill  . acetaminophen (TYLENOL) 500 MG tablet Take 500 mg by mouth every 8 (eight) hours as needed (for pain).     Marland Kitchen alfuzosin (UROXATRAL) 10 MG 24 hr tablet Take 1 tablet by mouth daily.    Marland Kitchen aspirin 81 MG tablet Take 81 mg by mouth daily.      . diclofenac (VOLTAREN) 75 MG EC tablet Take 1 tablet (75 mg total) by mouth 2 (two) times daily. 180 tablet 3  . doxazosin (CARDURA) 2 MG tablet Take 2 mg by mouth daily.    Marland Kitchen  esomeprazole (NEXIUM) 40 MG capsule TAKE 1 CAPSULE DAILY BEFOREBREAKFAST 90 capsule 3  . eszopiclone (LUNESTA) 2 MG TABS tablet TAKE 1 TABLET BY MOUTH  AT BEDTIME AS NEEDED FOR SLEEP 30 tablet 0  . finasteride (PROSCAR) 5 MG tablet Take 1 tablet (5 mg total) by mouth daily. 90 tablet 3  . fluticasone (FLONASE) 50 MCG/ACT nasal spray Place 1 spray into both nostrils daily. 16 g 2  . pravastatin (PRAVACHOL) 40 MG tablet Take 1 tablet (40 mg total) by mouth every evening. 90 tablet 3   No current facility-administered medications for this visit.     Allergies as of 04/03/2018 - Review Complete 04/03/2018  Allergen Reaction Noted  .  Anesthetics, amide Nausea Only 12/18/2013  . Atorvastatin  04/21/2006  . Oxycodone Nausea And Vomiting 10/28/2014  . Simvastatin  04/21/2006  . Latex Rash 04/07/2014  . Statins Other (See Comments) 11/17/2014    Family History  Problem Relation Age of Onset  . Hypertension Mother   . Breast cancer Mother   . Colon polyps Mother   . Coronary artery disease Mother   . Diabetes Neg Hx   . Colon cancer Neg Hx   . Esophageal cancer Neg Hx   . Gallbladder disease Neg Hx   . Kidney disease Neg Hx     Social History   Socioeconomic History  . Marital status: Married    Spouse name: Not on file  . Number of children: 3  . Years of education: Not on file  . Highest education level: Not on file  Occupational History  . Occupation: retired    Fish farm manager: RETIRED  Social Needs  . Financial resource strain: Not on file  . Food insecurity:    Worry: Not on file    Inability: Not on file  . Transportation needs:    Medical: Not on file    Non-medical: Not on file  Tobacco Use  . Smoking status: Never Smoker  . Smokeless tobacco: Never Used  Substance and Sexual Activity  . Alcohol use: Yes    Alcohol/week: 0.0 standard drinks    Comment: occassional wine  . Drug use: No  . Sexual activity: Not on file  Lifestyle  . Physical activity:    Days per week: Not on file    Minutes per session: Not on file  . Stress: Not on file  Relationships  . Social connections:    Talks on phone: Not on file    Gets together: Not on file    Attends religious service: Not on file    Active member of club or organization: Not on file    Attends meetings of clubs or organizations: Not on file    Relationship status: Not on file  . Intimate partner violence:    Fear of current or ex partner: Not on file    Emotionally abused: Not on file    Physically abused: Not on file    Forced sexual activity: Not on file  Other Topics Concern  . Not on file  Social History Narrative   Lives at home  with wife      Has living will   Wife is health care POA---alternate daughter [name]   Would accept resuscitation attempts   No tube feeds if cognitively unaware           Physical Exam: BP 120/64   Pulse 68   Ht 5\' 8"  (1.727 m)   Wt 216 lb 8 oz (98.2 kg)  BMI 32.92 kg/m  Constitutional: generally well-appearing Psychiatric: alert and oriented x3 Abdomen: soft, nontender, nondistended, no obvious ascites, no peritoneal signs, normal bowel sounds No peripheral edema noted in lower extremities  Assessment and plan: 77 y.o. male with recent diverticulitis  I do not think that he has had repeat episode of diverticulitis.  I think more likely the [place] CT scan findings were just simply slow resolution of his initial acute infection.  He is quite clear that he was told he had a urinary tract infection and his symptoms were quite a bit different in terms of location of his pain.  He has had some mild constipation which has clearly improved back to his normal since he added some fiber to his diet and increased his water intake.  He has seen some minor blood in his stool but that is infrequent.  We discussed repeating his colonoscopy sooner than 2 years from now but he did not feel strongly about it and since he has definitely improved with some minor dietary modifications I think it is reasonable to just see how he does.  He knows to call here if he has another event that seems like diverticulitis to him or any other concerning GI issues.  Please see the "Patient Instructions" section for addition details about the plan.  Owens Loffler, MD [facility] 04/03/2018, 3:29 PM

## 2018-04-03 NOTE — Patient Instructions (Addendum)
Call if you have any new, concerning GI issues.  Thank you for entrusting me with your care and choosing [facility].  Dr Ardis Hughs

## 2018-04-09 ENCOUNTER — Encounter: Payer: Self-pay | Admitting: Internal Medicine

## 2018-04-09 ENCOUNTER — Ambulatory Visit (INDEPENDENT_AMBULATORY_CARE_PROVIDER_SITE_OTHER): Payer: Medicare Other | Admitting: Internal Medicine

## 2018-04-09 VITALS — BP 138/80 | HR 58 | Ht 68.0 in | Wt 213.8 lb

## 2018-04-09 DIAGNOSIS — G4733 Obstructive sleep apnea (adult) (pediatric): Secondary | ICD-10-CM

## 2018-04-09 NOTE — Patient Instructions (Signed)
Continue BiPAP as prescribed 

## 2018-04-09 NOTE — Progress Notes (Signed)
[facility] Consultation      Date: 04/09/2018,   MRN# [id number] SEQUOYAH COUNTERMAN [birth year]      AdmissionWeight: 213 lb 12.8 oz (97 kg)                 CurrentWeight: 213 lb 12.8 oz (97 kg) [name] is a 77 y.o. old male seen in consultation for chronic SOB  PFT 05/29/15 fvc 52% fev1 60%, ratio 83% DLCO 66% findings  suggest moderate restrictive lung disease  CHIEF COMPLAINT:   Follow up OSA Follow up SOB   HISTORY OF PRESENT ILLNESS  Follow up chronic SOB Dx with RT sided elevated hemidiaphragm No significant worsening SOB Most of his symptoms correlated with weight  Walks 3 miles per week 247 pounds now 213 pounds  No signs of infection No signs of CHF  Compliance report 100% days and >4hrs AHI 3.7 Doing well with full face mask   BIPAP   MEDICATIONS    Current Medication:  Current Outpatient Medications:  .  acetaminophen (TYLENOL) 500 MG tablet, Take 500 mg by mouth every 8 (eight) hours as needed (for pain). , Disp: , Rfl:  .  alfuzosin (UROXATRAL) 10 MG 24 hr tablet, Take 1 tablet by mouth daily., Disp: , Rfl:  .  aspirin 81 MG tablet, Take 81 mg by mouth daily.  , Disp: , Rfl:  .  diclofenac (VOLTAREN) 75 MG EC tablet, Take 1 tablet (75 mg total) by mouth 2 (two) times daily., Disp: 180 tablet, Rfl: 3 .  doxazosin (CARDURA) 2 MG tablet, Take 2 mg by mouth daily., Disp: , Rfl:  .  esomeprazole (NEXIUM) 40 MG capsule, TAKE 1 CAPSULE DAILY BEFOREBREAKFAST, Disp: 90 capsule, Rfl: 3 .  eszopiclone (LUNESTA) 2 MG TABS tablet, TAKE 1 TABLET BY MOUTH  AT BEDTIME AS NEEDED FOR SLEEP, Disp: 30 tablet, Rfl: 0 .  finasteride (PROSCAR) 5 MG tablet, Take 1 tablet (5 mg total) by mouth daily., Disp: 90 tablet, Rfl: 3 .  fluticasone (FLONASE) 50 MCG/ACT nasal spray, Place 1 spray into both nostrils daily., Disp: 16 g, Rfl: 2 .  pravastatin (PRAVACHOL) 40 MG tablet, Take 1 tablet (40 mg total) by mouth every evening., Disp: 90 tablet, Rfl:  3    ALLERGIES   Anesthetics, amide; Atorvastatin; Oxycodone; Simvastatin; Latex; and Statins     REVIEW OF SYSTEMS   Review of Systems  Constitutional: Negative for chills, fever and weight loss.  Eyes: Negative.   Respiratory: Negative for cough, hemoptysis, sputum production, shortness of breath and wheezing.   Cardiovascular: Negative for chest pain, orthopnea and leg swelling.  Neurological: Negative for dizziness.  All other systems reviewed and are negative.    VS: BP 138/80 (BP Location: Left Arm, Cuff Size: Normal)   Pulse (!) 58   Ht 5\' 8"  (1.727 m)   Wt 213 lb 12.8 oz (97 kg)   SpO2 97%   BMI 32.51 kg/m      PHYSICAL EXAM  Physical Exam  Constitutional: He is oriented to person, place, and time. He appears well-developed and well-nourished. No distress.  Neck: Neck supple.  Cardiovascular: Normal rate, regular rhythm and normal heart sounds.  No murmur heard. Pulmonary/Chest: Effort normal and breath sounds normal. No respiratory distress. He has no wheezes.  Decreased Lung sounds at RT base  Musculoskeletal: He exhibits no edema.  Neurological: He is alert and oriented to person, place, and time.  Skin: He is not diaphoretic.  Psychiatric: He  has a normal mood and affect.     ASSESSMENT/PLAN    77 yo pleasant white male with chronic intermittent SOB with chronic elevated hemidiaphragm with underlying OSA  He is very compliant with biPAP and patient is using and benefiting from therapy  100% compliance AHI 3.7 biPAP full mask IPAP 18 and EPAP 4  RT sided elevated Hemidiaphragm-no intervention needed at this time   Obesity -recommend significant weight loss -recommend changing diet  Deconditioned state -Recommend increased daily activity and exercise   Patient satisfied with Plan of action and management. All questions answered Follow up in 2 years   Morey Andonian Patricia Pesa, M.D.  [facility]  Medical  Director [place] Director [facility] Cardio-Pulmonary Department

## 2018-04-17 DIAGNOSIS — G5603 Carpal tunnel syndrome, bilateral upper limbs: Secondary | ICD-10-CM | POA: Diagnosis not present

## 2018-04-22 ENCOUNTER — Other Ambulatory Visit: Payer: Self-pay | Admitting: Internal Medicine

## 2018-04-23 ENCOUNTER — Other Ambulatory Visit: Payer: Self-pay | Admitting: Internal Medicine

## 2018-04-23 ENCOUNTER — Telehealth: Payer: Self-pay | Admitting: Internal Medicine

## 2018-04-23 MED ORDER — ESZOPICLONE 2 MG PO TABS: ORAL_TABLET | ORAL | 0 refills | Status: DC

## 2018-04-23 NOTE — Addendum Note (Signed)
Addended by: Jearld Fenton on: 04/23/2018 10:26 AM   Modules accepted: Orders

## 2018-04-23 NOTE — Telephone Encounter (Signed)
Spoke to patient and was advised that his bottle shows 4 mg and that is what he has been taking. Advised patient we show 2 mg. Patient stated that Dr. Jeffie Pollock prescribes this. Patient stated that he will contact Dr. Ralene Muskrat office tomorrow and verify what dose he should be taking and will call back and advise.

## 2018-04-23 NOTE — Telephone Encounter (Signed)
Need refiil for eszopiclone 2 mg  Sent to [facility]  This pharmacy will be closing for good today at 6:30 and they are trying to expedite this pt's medication before they close for good.

## 2018-04-23 NOTE — Telephone Encounter (Signed)
He should be taking 2 mg daily. Will deny this refill request. We can send in 2 mg if he needs it.

## 2018-04-23 NOTE — Telephone Encounter (Signed)
Medication sent to pharmacy  

## 2018-04-23 NOTE — Telephone Encounter (Signed)
Electronic refill request Doxazosin  Request is for Doxazosin 4 mg which does not match the medication list Medication list shows Doxazosin 2 mg

## 2018-04-24 ENCOUNTER — Telehealth: Payer: Self-pay | Admitting: Internal Medicine

## 2018-04-24 NOTE — Telephone Encounter (Signed)
Pt called office to let shannon know he has talked to Dr.Wrenn (urologist) office. They will be calling [facility] directly for the Cardura.

## 2018-05-22 ENCOUNTER — Other Ambulatory Visit: Payer: Self-pay | Admitting: Internal Medicine

## 2018-05-22 NOTE — Telephone Encounter (Signed)
Last filled 04-23-18 #30 Last OV 01-24-18 Next OV 02-01-19 [facility] [name] [employer] [name]

## 2018-05-25 ENCOUNTER — Encounter: Payer: Self-pay | Admitting: Internal Medicine

## 2018-05-25 ENCOUNTER — Ambulatory Visit (INDEPENDENT_AMBULATORY_CARE_PROVIDER_SITE_OTHER): Payer: Medicare Other | Admitting: Internal Medicine

## 2018-05-25 ENCOUNTER — Telehealth: Payer: Self-pay | Admitting: *Deleted

## 2018-05-25 VITALS — BP 118/76 | HR 66 | Temp 98.3°F | Ht 68.0 in | Wt 217.0 lb

## 2018-05-25 DIAGNOSIS — M48061 Spinal stenosis, lumbar region without neurogenic claudication: Secondary | ICD-10-CM | POA: Diagnosis not present

## 2018-05-25 MED ORDER — FLUTICASONE PROPIONATE 50 MCG/ACT NA SUSP: 1.0000 | Freq: Every day | NASAL | 3 refills | Status: DC

## 2018-05-25 MED ORDER — TRAMADOL HCL 50 MG PO TABS: 50.0000 mg | ORAL_TABLET | Freq: Two times a day (BID) | ORAL | 0 refills | Status: DC | PRN

## 2018-05-25 NOTE — Telephone Encounter (Signed)
PA completed on CoverMyMeds. Waiting for a response. It will most likely be denied.

## 2018-05-25 NOTE — Assessment & Plan Note (Signed)
Chronic ongoing pain Done with injections Doesn't want surgery---- I agree Discussed full strength tylenol Then NSAIDs bid Tramadol for severe pain

## 2018-05-25 NOTE — Progress Notes (Signed)
Subjective:    Patient ID: [name], male    DOB: 1940/09/14, 78 y.o.   MRN: [id number]  HPI Here due to ongoing back pain  Has been going to [facility] Has had a series of epidural injections First one really helped the pain on the right side Next one didn't help-- 8-9 weeks ago  Has ongoing low back pain ---across mid lumbar back Worsens with prolonged standing or walking No pain when sitting This is limiting his activity Excruciating pain in the first hour of the morning---improves as the day goes on  Takes the diclofenac only in the morning--affects urine stream but not as much as ibuprofen Hasn't tried it at night Motrin helps the best--but it affects his urinary stream Tylenol not much help  Current Outpatient Medications on File Prior to Visit  Medication Sig Dispense Refill  . acetaminophen (TYLENOL) 500 MG tablet Take 500 mg by mouth every 8 (eight) hours as needed (for pain).     Marland Kitchen alfuzosin (UROXATRAL) 10 MG 24 hr tablet Take 1 tablet by mouth daily.    Marland Kitchen aspirin 81 MG tablet Take 81 mg by mouth daily.      . diclofenac (VOLTAREN) 75 MG EC tablet Take 1 tablet (75 mg total) by mouth 2 (two) times daily. 180 tablet 3  . doxazosin (CARDURA) 2 MG tablet Take 2 mg by mouth daily.    Marland Kitchen esomeprazole (NEXIUM) 40 MG capsule TAKE 1 CAPSULE DAILY BEFOREBREAKFAST 90 capsule 3  . eszopiclone (LUNESTA) 2 MG TABS tablet TAKE 1 TABLET BY MOUTH AT BEDTIME AS NEEDED FOR SLEEP 30 tablet 0  . finasteride (PROSCAR) 5 MG tablet Take 1 tablet (5 mg total) by mouth daily. 90 tablet 3  . fluticasone (FLONASE) 50 MCG/ACT nasal spray Place 1 spray into both nostrils daily. 16 g 2  . pravastatin (PRAVACHOL) 40 MG tablet Take 1 tablet (40 mg total) by mouth every evening. 90 tablet 3   No current facility-administered medications on file prior to visit.     Allergies  Allergen Reactions  . Anesthetics, Amide Nausea Only    Other reaction(s): Vomiting After surgery patient has  problems with nausea and vomiting due to anesthetics  . Atorvastatin     REACTION: myalgias  . Oxycodone Nausea And Vomiting  . Simvastatin     REACTION: joint and stomach pain  . Latex Rash    Local area  . Statins Other (See Comments)    Joint pain    Past Medical History:  Diagnosis Date  . Allergy   . Anxiety   . Aortic stenosis   . BPH (benign prostatic hypertrophy)   . Diaphragm dysfunction    right frozen diaphragm  . Diverticulitis   . GERD (gastroesophageal reflux disease)   . Hyperlipidemia   . Kidney stones   . Obstructive sleep apnea    CPAP  . Osteoarthritis   . Osteoporosis   . Sleep apnea    cpap nightly  . Spinal stenosis     Past Surgical History:  Procedure Laterality Date  . ESOPHAGOGASTRODUODENOSCOPY     colon--6/04, Barrett's--7/05, Barrett's but no cancer--4/08  . EYE MUSCLE SURGERY     L eye muscle surgery--2/01  . JOINT REPLACEMENT  1/12   left TKR  . KNEE ARTHROSCOPY     7/ 09 Arthroscopy left knee--Dr Noemi Chapel  . LIPOMA EXCISION Left 2010   compressing nerve by spine---Dr Terald Sleeper at [facility]  . ROTATOR CUFF REPAIR  R rotator cuff repair--1/00  Noemi Chapel)  . ROTATOR CUFF REPAIR Left ~2005 & 11/15   biceps repair also in 2015  . TENDON REPAIR     Left knee tendon repair--1966  . TOTAL KNEE ARTHROPLASTY Right 4/16  . VASECTOMY     Vasectomy/spermatocele/?testicular cyst--1970    Family History  Problem Relation Age of Onset  . Hypertension Mother   . Breast cancer Mother   . Colon polyps Mother   . Coronary artery disease Mother   . Diabetes Neg Hx   . Colon cancer Neg Hx   . Esophageal cancer Neg Hx   . Gallbladder disease Neg Hx   . Kidney disease Neg Hx     Social History   Socioeconomic History  . Marital status: Married    Spouse name: Not on file  . Number of children: 3  . Years of education: Not on file  . Highest education level: Not on file  Occupational History  . Occupation: retired    Fish farm manager: RETIRED    Social Needs  . Financial resource strain: Not on file  . Food insecurity:    Worry: Not on file    Inability: Not on file  . Transportation needs:    Medical: Not on file    Non-medical: Not on file  Tobacco Use  . Smoking status: Never Smoker  . Smokeless tobacco: Never Used  Substance and Sexual Activity  . Alcohol use: Yes    Alcohol/week: 0.0 standard drinks    Comment: occassional wine  . Drug use: No  . Sexual activity: Not on file  Lifestyle  . Physical activity:    Days per week: Not on file    Minutes per session: Not on file  . Stress: Not on file  Relationships  . Social connections:    Talks on phone: Not on file    Gets together: Not on file    Attends religious service: Not on file    Active member of club or organization: Not on file    Attends meetings of clubs or organizations: Not on file    Relationship status: Not on file  . Intimate partner violence:    Fear of current or ex partner: Not on file    Emotionally abused: Not on file    Physically abused: Not on file    Forced sexual activity: Not on file  Other Topics Concern  . Not on file  Social History Narrative   Lives at home with wife      Has living will   Wife is health care POA---alternate daughter [name]   Would accept resuscitation attempts   No tube feeds if cognitively unaware         Review of Systems No stomach troubles On nexium still Happy now with his dual therapy for prostate--- uroxatral in AM, doxazosin at night    Objective:   Physical Exam  Musculoskeletal:     Comments: Back stays flexed some with walking--can't extend fully Good active back flexion  Neurological:  Fairly normal gait           Assessment & Plan:

## 2018-05-25 NOTE — Telephone Encounter (Signed)
Spoke to pt who states he was to contact office back with his [facility] ID# for Medicare ptD for shannon, which is [id number]

## 2018-05-25 NOTE — Patient Instructions (Signed)
Please try acetaminophen 650-1000mg  up to three times a day. If this doesn't give you increased relief after 1-2 weeks---move onto the next step. Next is ibuprofen 600mg  (advil) or diclofenac twice a day (instread of just in the morning). I have prescribed an opioid pain reliever for when you pain is very severe.

## 2018-05-25 NOTE — Telephone Encounter (Signed)
Patient calls right before 5pm today to check on status of his lunesta.  I informed him that we have not been informed as to the status of the prior auth.  Patient states he called [employer] and they have denied the [place].  Out of pocket is 400 plus dollars.   [facility] states that they will cover Lorrin Mais if it is written for 30 pills per month with the max qty per year written on the script.  Patient states he is completely out of sleeping pills and would like Ambien called in as soon as possible.   I informed him that as it is after hours, it may be Monday before there is a response.  Patient is not happy and would like to speak with on call, I informed him that because this is a new change and a controlled he is likely not to get a script of this nature from the on call,  but he is welcome to call the on call and discuss.    I will forward this to Dr. Silvio Pate and make him aware that the patient is out of his sleep medication and is anxious to pick up tomorrow if can be filled as soon as possible.

## 2018-05-27 NOTE — Telephone Encounter (Signed)
Please let him know that I will not prescribe ambien for him. It is like jumping out of the frying pan into the fire! They are both more dependence provoking than narcotics----and he is heavily dependent on the lunesta. The best thing he could do would be do get along without it---even though it may cause some sleepness nights. He can try melatonin 5 to 10 mg or I would prescribe trazodone  He can probably find his lunesta for a lower cash price if he checks around

## 2018-05-28 NOTE — Telephone Encounter (Signed)
[facility] Patient Name: [name] Gender: Male DOB: [birth year] Age: 78 Y 11 M 2 D Return Phone Number: [phone] (Primary) Address: City/State/Zip: [place] [address] Client [facility] Night - Client Client Site [place] Physician Viviana Simpler - MD Contact Type Call Who Is Calling Patient / Member / Family / Caregiver Call Type Triage / Clinical Relationship To Patient Self Return Phone Number [phone] (Primary) Chief Complaint Prescription Refill or Medication Request (non symptomatic) Reason for Call Request to Speak to a Physician Initial Comment Caller states he was seen in today, he has a sleeping pill and needs a script for tonight, he is out of medication. He contacted the office before closing, he states she contacted the insurance and waiting on a call back, he also contacted the insurance and it is not covered under insurance, he needs an authorization for medication. Needs on call to send in an order for medication Ambien. Original sleeping pill is not covered and costs 400.00Insurance states Ambien is covered, Event organiser. Needs authorization due to age, and needs to calculate monthly/ yearly script. [facility]. Translation No No Triage Reason Patient declined Nurse Assessment Nurse: Hassell Done, RN, Melanie Date/Time Eilene Ghazi Time): 05/25/2018 6:39:04 PM Confirm and document reason for call. If symptomatic, describe symptoms. ---Caller states he was at the Dr office this morning and he was told it could be taken care of today. Caller had asked for Ambien to be called in. Dr tried to call the insurance company. Explained to caller that as a controlled substance he would not be able to get a script called in and he plans to take care of it Monday. Caller denied need for triage of  symptoms. Does the patient have any new or worsening symptoms? ---Yes Will a triage be completed? ---No Select reason for no triage. ---Patient declined Guidelines Guideline Title Affirmed Question Affirmed Notes Nurse Date/Time (Eastern Time) Disp. Time Eilene Ghazi Time) Disposition Final User 05/25/2018 6:12:08 PM Send To Nurse Carmichaels, RN, [name] PLEASE NOTE:

## 2018-05-28 NOTE — Telephone Encounter (Signed)
Spoke to pt. Advised him what Dr Silvio Pate said. He will call around and see if he can find Lunesta cheaper. I suggested he get on [facility] and see the prices they will give him.

## 2018-05-29 ENCOUNTER — Telehealth: Payer: Self-pay

## 2018-05-29 NOTE — Telephone Encounter (Signed)
Pt left v/m that [facility] [address] will not transfer [facility]. I spoke with Asencion Partridge at [employer] [address] and pt has not picked up lunesta # 30 dated 05/22/18. I spoke with pharmacist and cancelled that rx for lunesta # 30 and sending refill request to Dr Silvio Pate for [facility] # [facility]. Pt request cb when completed.

## 2018-05-30 MED ORDER — ESZOPICLONE 2 MG PO TABS: 2.0000 mg | ORAL_TABLET | Freq: Every evening | ORAL | 0 refills | Status: DC | PRN

## 2018-05-30 NOTE — Telephone Encounter (Signed)
Called in rx. Spoke to pt. He said it is $28 at [facility].

## 2018-05-30 NOTE — Telephone Encounter (Signed)
That is fine I would not be surprised if he found a reasonable price---ask him how much he has to pay there.

## 2018-06-29 ENCOUNTER — Encounter: Payer: Self-pay | Admitting: Internal Medicine

## 2018-06-29 ENCOUNTER — Ambulatory Visit: Payer: Medicare Other | Admitting: Internal Medicine

## 2018-06-29 ENCOUNTER — Ambulatory Visit (INDEPENDENT_AMBULATORY_CARE_PROVIDER_SITE_OTHER): Payer: Medicare Other | Admitting: Internal Medicine

## 2018-06-29 VITALS — BP 110/80 | HR 73 | Temp 98.2°F | Ht 68.0 in | Wt 214.0 lb

## 2018-06-29 DIAGNOSIS — M5416 Radiculopathy, lumbar region: Secondary | ICD-10-CM | POA: Diagnosis not present

## 2018-06-29 DIAGNOSIS — R200 Anesthesia of skin: Secondary | ICD-10-CM

## 2018-06-29 DIAGNOSIS — G4733 Obstructive sleep apnea (adult) (pediatric): Secondary | ICD-10-CM | POA: Diagnosis not present

## 2018-06-29 DIAGNOSIS — R5383 Other fatigue: Secondary | ICD-10-CM

## 2018-06-29 LAB — SEDIMENTATION RATE: SED RATE: 6 mm/h (ref 0–20)

## 2018-06-29 LAB — CBC
HCT: 39.2 % (ref 39.0–52.0)
Hemoglobin: 13.3 g/dL (ref 13.0–17.0)
MCHC: 33.9 g/dL (ref 30.0–36.0)
MCV: 85.3 fl (ref 78.0–100.0)
Platelets: 208 10*3/uL (ref 150.0–400.0)
RBC: 4.6 Mil/uL (ref 4.22–5.81)
RDW: 13.4 % (ref 11.5–15.5)
WBC: 6 10*3/uL (ref 4.0–10.5)

## 2018-06-29 LAB — COMPREHENSIVE METABOLIC PANEL
ALBUMIN: 4.2 g/dL (ref 3.5–5.2)
ALT: 13 U/L (ref 0–53)
AST: 15 U/L (ref 0–37)
Alkaline Phosphatase: 41 U/L (ref 39–117)
BILIRUBIN TOTAL: 0.8 mg/dL (ref 0.2–1.2)
BUN: 17 mg/dL (ref 6–23)
CALCIUM: 9 mg/dL (ref 8.4–10.5)
CO2: 29 mEq/L (ref 19–32)
CREATININE: 0.93 mg/dL (ref 0.40–1.50)
Chloride: 103 mEq/L (ref 96–112)
GFR: 78.78 mL/min (ref >60.00)
Glucose, Bld: 88 mg/dL (ref 70–99)
Potassium: 4.1 mEq/L (ref 3.5–5.1)
Sodium: 139 mEq/L (ref 135–145)
Total Protein: 6.4 g/dL (ref 6.0–8.3)

## 2018-06-29 LAB — T4, FREE: Free T4: 1.11 ng/dL (ref 0.60–1.60)

## 2018-06-29 LAB — VITAMIN B12: VITAMIN B 12: 285 pg/mL (ref 211–911)

## 2018-06-29 MED ORDER — ESZOPICLONE 2 MG PO TABS: 2.0000 mg | ORAL_TABLET | Freq: Every evening | ORAL | 0 refills | Status: DC | PRN

## 2018-06-29 MED ORDER — TRAMADOL HCL 50 MG PO TABS: 50.0000 mg | ORAL_TABLET | Freq: Two times a day (BID) | ORAL | 0 refills | Status: DC | PRN

## 2018-06-29 MED ORDER — ESOMEPRAZOLE MAGNESIUM 40 MG PO CPDR: DELAYED_RELEASE_CAPSULE | ORAL | 11 refills | Status: DC

## 2018-06-29 NOTE — Progress Notes (Signed)
Subjective:    Patient ID: [name], male    DOB: 1941-02-13, 78 y.o.   MRN: [id number]  HPI Here due to fatigue  Using the tramadol----when the pain comes on Mostly bid---but occasionally only one a day Has cut back on the voltaren---since pain better controlled  Takes nap at [facility]-- ~45-60 minutes Feels better then but not totally refreshed Then dragging by 4PM again Going to sleep by 9-10PM Sleeps through till 6-6:30AM Wears BiPAP every night--compliance and control are excellent Trying to cut down lunesta--only taking 1/2 at times (often taking when he wakes up at 2AM and can't get back to sleep)  Appetite is fine--trying to be careful (2 meals mostly with evening snack) Weight stable  Current Outpatient Medications on File Prior to Visit  Medication Sig Dispense Refill  . acetaminophen (TYLENOL) 500 MG tablet Take 500 mg by mouth every 8 (eight) hours as needed (for pain).     Marland Kitchen alfuzosin (UROXATRAL) 10 MG 24 hr tablet Take 1 tablet by mouth daily.    Marland Kitchen aspirin 81 MG tablet Take 81 mg by mouth daily.      . diclofenac (VOLTAREN) 75 MG EC tablet Take 1 tablet (75 mg total) by mouth 2 (two) times daily. 180 tablet 3  . doxazosin (CARDURA) 2 MG tablet Take 2 mg by mouth daily.    Marland Kitchen esomeprazole (NEXIUM) 40 MG capsule TAKE 1 CAPSULE DAILY BEFOREBREAKFAST 90 capsule 3  . eszopiclone (LUNESTA) 2 MG TABS tablet Take 1 tablet (2 mg total) by mouth at bedtime as needed. for sleep 30 tablet 0  . finasteride (PROSCAR) 5 MG tablet Take 1 tablet (5 mg total) by mouth daily. 90 tablet 3  . fluticasone (FLONASE) 50 MCG/ACT nasal spray Place 1 spray into both nostrils daily. 48 g 3  . traMADol (ULTRAM) 50 MG tablet Take 1 tablet (50 mg total) by mouth 2 (two) times daily as needed. 60 tablet 0  . pravastatin (PRAVACHOL) 40 MG tablet Take 1 tablet (40 mg total) by mouth every evening. 90 tablet 3   No current facility-administered medications on file prior to visit.     Allergies    Allergen Reactions  . Anesthetics, Amide Nausea Only    Other reaction(s): Vomiting After surgery patient has problems with nausea and vomiting due to anesthetics  . Atorvastatin     REACTION: myalgias  . Oxycodone Nausea And Vomiting  . Simvastatin     REACTION: joint and stomach pain  . Latex Rash    Local area  . Statins Other (See Comments)    Joint pain    Past Medical History:  Diagnosis Date  . Allergy   . Anxiety   . Aortic stenosis   . BPH (benign prostatic hypertrophy)   . Diaphragm dysfunction    right frozen diaphragm  . Diverticulitis   . GERD (gastroesophageal reflux disease)   . Hyperlipidemia   . Kidney stones   . Obstructive sleep apnea    CPAP  . Osteoarthritis   . Osteoporosis   . Sleep apnea    cpap nightly  . Spinal stenosis     Past Surgical History:  Procedure Laterality Date  . ESOPHAGOGASTRODUODENOSCOPY     colon--6/04, Barrett's--7/05, Barrett's but no cancer--4/08  . EYE MUSCLE SURGERY     L eye muscle surgery--2/01  . JOINT REPLACEMENT  1/12   left TKR  . KNEE ARTHROSCOPY     7/ 09 Arthroscopy left knee--Dr Noemi Chapel  . LIPOMA  EXCISION Left 2010   compressing nerve by spine---[facility] Terald Sleeper at [facility]  . ROTATOR CUFF REPAIR     R rotator cuff repair--1/00  Noemi Chapel)  . ROTATOR CUFF REPAIR Left ~2005 & 11/15   biceps repair also in 2015  . TENDON REPAIR     Left knee tendon repair--1966  . TOTAL KNEE ARTHROPLASTY Right 4/16  . VASECTOMY     Vasectomy/spermatocele/?testicular cyst--1970    Family History  Problem Relation Age of Onset  . Hypertension Mother   . Breast cancer Mother   . Colon polyps Mother   . Coronary artery disease Mother   . Diabetes Neg Hx   . Colon cancer Neg Hx   . Esophageal cancer Neg Hx   . Gallbladder disease Neg Hx   . Kidney disease Neg Hx     Social History   Socioeconomic History  . Marital status: Married    Spouse name: Not on file  . Number of children: 3  . Years of education: Not on file   . Highest education level: Not on file  Occupational History  . Occupation: retired    Fish farm manager: RETIRED  Social Needs  . Financial resource strain: Not on file  . Food insecurity:    Worry: Not on file    Inability: Not on file  . Transportation needs:    Medical: Not on file    Non-medical: Not on file  Tobacco Use  . Smoking status: Never Smoker  . Smokeless tobacco: Never Used  Substance and Sexual Activity  . Alcohol use: Yes    Alcohol/week: 0.0 standard drinks    Comment: occassional wine  . Drug use: No  . Sexual activity: Not on file  Lifestyle  . Physical activity:    Days per week: Not on file    Minutes per session: Not on file  . Stress: Not on file  Relationships  . Social connections:    Talks on phone: Not on file    Gets together: Not on file    Attends religious service: Not on file    Active member of club or organization: Not on file    Attends meetings of clubs or organizations: Not on file    Relationship status: Not on file  . Intimate partner violence:    Fear of current or ex partner: Not on file    Emotionally abused: Not on file    Physically abused: Not on file    Forced sexual activity: Not on file  Other Topics Concern  . Not on file  Social History Narrative   Lives at home with wife      Has living will   Wife is health care POA---alternate daughter [name]   Would accept resuscitation attempts   No tube feeds if cognitively unaware         Review of Systems  Getting ready to travel to [place] to see great grandson No fever Some right temporal headache--and ear stopped up (last 10 days) No cough or SOB No chest pain Still goes to gym twice a week---does feel tired after exercise No skin rash or lesions (other than some bumps under nose--which is better) Bowels are slow---takes miralax Voids fine     Objective:   Physical Exam  Constitutional: He appears well-developed. No distress.  HENT:  Mouth/Throat: Oropharynx is  clear and moist. No oropharyngeal exudate.  Cerumen bilaterally  Neck: No thyromegaly present.  Cardiovascular: Normal rate, regular rhythm and normal heart sounds. Exam  reveals no gallop.  No murmur heard. Respiratory: Effort normal and breath sounds normal. No respiratory distress. He has no wheezes. He has no rales.  GI: Soft. There is no abdominal tenderness.  Musculoskeletal:        General: No tenderness or edema.     Comments: No synovitis  Lymphadenopathy:    He has no cervical adenopathy.    He has no axillary adenopathy.       Right: No inguinal adenopathy present.       Left: No inguinal adenopathy present.  Skin: No rash noted. No erythema.  Psychiatric: He has a normal mood and affect. His behavior is normal.           Assessment & Plan:

## 2018-06-29 NOTE — Assessment & Plan Note (Signed)
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

## 2018-06-29 NOTE — Assessment & Plan Note (Addendum)
Does well with the BiPap Dependent on lunesta--needs to get off Will need to just stop Would be better to add another tramadol at bedtime Can try melatonin

## 2018-06-29 NOTE — Patient Instructions (Signed)
Stop the [place] after your trip to [place]. You can try melatonin 3, 5 or even 10mg  at bedtime instead. Do not take any sleep aide after 10PM If you need to add another tramadol at bedtime, I can change your prescription and add a third dose

## 2018-06-29 NOTE — Assessment & Plan Note (Signed)
Doing better with the tramadol

## 2018-07-05 DIAGNOSIS — N401 Enlarged prostate with lower urinary tract symptoms: Secondary | ICD-10-CM | POA: Diagnosis not present

## 2018-07-05 DIAGNOSIS — M545 Low back pain: Secondary | ICD-10-CM | POA: Diagnosis not present

## 2018-07-05 DIAGNOSIS — R351 Nocturia: Secondary | ICD-10-CM | POA: Diagnosis not present

## 2018-07-20 ENCOUNTER — Other Ambulatory Visit: Payer: Self-pay | Admitting: Internal Medicine

## 2018-07-23 MED ORDER — TRAMADOL HCL 50 MG PO TABS: 50.0000 mg | ORAL_TABLET | Freq: Three times a day (TID) | ORAL | 0 refills | Status: DC | PRN

## 2018-08-03 MED ORDER — DICLOFENAC SODIUM 75 MG PO TBEC: 75.0000 mg | DELAYED_RELEASE_TABLET | Freq: Two times a day (BID) | ORAL | 3 refills | Status: DC

## 2018-08-22 ENCOUNTER — Encounter: Payer: Self-pay | Admitting: Family Medicine

## 2018-08-22 ENCOUNTER — Encounter: Payer: Self-pay | Admitting: *Deleted

## 2018-08-22 ENCOUNTER — Ambulatory Visit (INDEPENDENT_AMBULATORY_CARE_PROVIDER_SITE_OTHER): Payer: Medicare Other | Admitting: Family Medicine

## 2018-08-22 VITALS — Ht 68.0 in

## 2018-08-22 DIAGNOSIS — B9789 Other viral agents as the cause of diseases classified elsewhere: Secondary | ICD-10-CM

## 2018-08-22 DIAGNOSIS — J028 Acute pharyngitis due to other specified organisms: Secondary | ICD-10-CM | POA: Diagnosis not present

## 2018-08-22 DIAGNOSIS — J309 Allergic rhinitis, unspecified: Secondary | ICD-10-CM

## 2018-08-22 MED ORDER — FLUTICASONE PROPIONATE 50 MCG/ACT NA SUSP: 1.0000 | Freq: Every day | NASAL | 1 refills | Status: DC

## 2018-08-22 NOTE — Progress Notes (Signed)
I connected with [name] on 08/22/18 at 11:20 AM EDT by video and verified that I am speaking with the correct person using two identifiers.   I discussed the limitations, risks, security and privacy concerns of performing an evaluation and management service by video and the availability of in person appointments. I also discussed with the patient that there may be a patient responsible charge related to this service. The patient expressed understanding and agreed to proceed.  Patient location: Home Provider Location: [facility] Participants: Lesleigh Noe and [name] and wife beth   Subjective:     [name] is a 78 y.o. male presenting for Adenopathy (Right Side of neck underneath jaw bone); White Spots in back throat; and Ear Feels Clogged (Right Ear)     Sore Throat   This is a new problem. The current episode started in the past 7 days. The pain is worse on the right side. There has been no fever. The pain is at a severity of 0/10. Associated symptoms include congestion, ear pain, headaches (at night on the right side), a hoarse voice, a plugged ear sensation and swollen glands (right side under neck). Pertinent negatives include no coughing, diarrhea, ear discharge, neck pain, shortness of breath, trouble swallowing or vomiting. He has tried acetaminophen (decongestant, flonase) for the symptoms.   White spots in the back of the throat  Wife describes white spots on the back of the throat - scattered across the back Could see drainage yesterday   Feels like a knot on the uvula - gargled with paroxide and water and symptoms improved  Takes nighttime tylenol sinus, and claritin  Review of Systems  HENT: Positive for congestion, ear pain and hoarse voice. Negative for ear discharge and trouble swallowing.   Respiratory: Negative for cough and shortness of breath.   Gastrointestinal: Negative for diarrhea and vomiting.  Musculoskeletal:  Negative for neck pain.  Neurological: Positive for headaches (at night on the right side).     Social History   Tobacco Use  Smoking Status Never Smoker  Smokeless Tobacco Never Used        Objective:   BP Readings from Last 3 Encounters:  06/29/18 110/80  05/25/18 118/76  04/09/18 138/80   Wt Readings from Last 3 Encounters:  06/29/18 214 lb (97.1 kg)  05/25/18 217 lb (98.4 kg)  04/09/18 213 lb 12.8 oz (97 kg)    Ht 5\' 8"  (1.727 m)   BMI 32.54 kg/m   Physical Exam Constitutional:      Appearance: Normal appearance.  HENT:     Head: Normocephalic and atraumatic.     Right Ear: External ear normal.     Left Ear: External ear normal.     Nose: Congestion present.     Mouth/Throat:     Mouth: Mucous membranes are moist.     Pharynx: Posterior oropharyngeal erythema present. No oropharyngeal exudate.     Comments: Posterior mouth with some white changes present. No clear exudate. Tonsils not visible Eyes:     Conjunctiva/sclera: Conjunctivae normal.  Neck:     Musculoskeletal: Normal range of motion.  Pulmonary:     Effort: Pulmonary effort is normal. No respiratory distress.     Breath sounds: No wheezing.  Lymphadenopathy:     Cervical: Cervical adenopathy present.  Neurological:     Mental Status: He is alert. Mental status is at baseline.  Psychiatric:  Mood and Affect: Mood normal.        Thought Content: Thought content normal.            Assessment & Plan:   Problem List Items Addressed This Visit    None    Visit Diagnoses    Viral sore throat    -  Primary   Allergic rhinitis, unspecified seasonality, unspecified trigger       Relevant Medications   fluticasone (FLONASE) 50 MCG/ACT nasal spray     Suspect viral syndrome with mild symptoms currently. Discussed return precautions including monitoring for cough and SOB and ER precautions given chronic lung issue.   Pt otherwise feeling fine. Recommended call back if he develops  fever or symptoms not improved next week.   Return if symptoms worsen or fail to improve.  Lesleigh Noe, MD

## 2018-09-09 ENCOUNTER — Other Ambulatory Visit: Payer: Self-pay | Admitting: Internal Medicine

## 2018-09-10 NOTE — Telephone Encounter (Signed)
Last filled 07-24-18 #90 Last OV Acute 08-22-18 Next OV 02-01-19 [facility]

## 2018-10-15 ENCOUNTER — Emergency Department: Payer: Medicare Other

## 2018-10-15 ENCOUNTER — Emergency Department
Admission: EM | Admit: 2018-10-15 | Discharge: 2018-10-15 | Disposition: A | Discharge status: Home / Self Care | Payer: Medicare Other | Attending: Emergency Medicine | Admitting: Emergency Medicine

## 2018-10-15 ENCOUNTER — Other Ambulatory Visit: Payer: Self-pay

## 2018-10-15 DIAGNOSIS — M25562 Pain in left knee: Secondary | ICD-10-CM | POA: Diagnosis not present

## 2018-10-15 DIAGNOSIS — S8002XA Contusion of left knee, initial encounter: Secondary | ICD-10-CM | POA: Diagnosis not present

## 2018-10-15 DIAGNOSIS — Y998 Other external cause status: Secondary | ICD-10-CM | POA: Diagnosis not present

## 2018-10-15 DIAGNOSIS — Z96651 Presence of right artificial knee joint: Secondary | ICD-10-CM | POA: Insufficient documentation

## 2018-10-15 DIAGNOSIS — Z79899 Other long term (current) drug therapy: Secondary | ICD-10-CM | POA: Insufficient documentation

## 2018-10-15 DIAGNOSIS — I10 Essential (primary) hypertension: Secondary | ICD-10-CM | POA: Diagnosis not present

## 2018-10-15 DIAGNOSIS — W19XXXA Unspecified fall, initial encounter: Secondary | ICD-10-CM | POA: Insufficient documentation

## 2018-10-15 DIAGNOSIS — Z91040 Latex allergy status: Secondary | ICD-10-CM | POA: Insufficient documentation

## 2018-10-15 DIAGNOSIS — Y929 Unspecified place or not applicable: Secondary | ICD-10-CM | POA: Diagnosis not present

## 2018-10-15 DIAGNOSIS — Y9389 Activity, other specified: Secondary | ICD-10-CM | POA: Insufficient documentation

## 2018-10-15 DIAGNOSIS — S8992XA Unspecified injury of left lower leg, initial encounter: Secondary | ICD-10-CM | POA: Diagnosis not present

## 2018-10-15 DIAGNOSIS — S8012XA Contusion of left lower leg, initial encounter: Secondary | ICD-10-CM | POA: Diagnosis not present

## 2018-10-15 DIAGNOSIS — Z96652 Presence of left artificial knee joint: Secondary | ICD-10-CM | POA: Diagnosis not present

## 2018-10-15 DIAGNOSIS — S79922A Unspecified injury of left thigh, initial encounter: Secondary | ICD-10-CM | POA: Diagnosis not present

## 2018-10-15 DIAGNOSIS — M7989 Other specified soft tissue disorders: Secondary | ICD-10-CM | POA: Diagnosis not present

## 2018-10-15 NOTE — ED Notes (Signed)
See triage note. States he lost his balance when he tried to kick his rooster  Having pain to left knee

## 2018-10-15 NOTE — Discharge Instructions (Addendum)
Follow-up with your regular doctor or your orthopedic doctor if not better in 5 to 7 days.  Wear the Ace wrap for compression.  If you start to have numbness and tingling in the lower extremity please trend emergency department right away.

## 2018-10-15 NOTE — ED Triage Notes (Signed)
Says he fell this am and has hematoma on knee.

## 2018-10-15 NOTE — ED Triage Notes (Signed)
Pt states his roaster attacked him and he tried to kick it and lost his balance and fell injuring his left knee.

## 2018-10-15 NOTE — ED Provider Notes (Signed)
[facility] Emergency Department Provider Note  ____________________________________________   First MD Initiated Contact with Patient 10/15/18 1442     (approximate)  I have reviewed the triage vital signs and the nursing notes.   HISTORY  Chief Complaint Fall and Knee Pain    HPI [name] is a 78 y.o. male presents emergency department stating he went to kick a rooster and fell onto the left knee and leg.  He states he has a lot of bruising and swelling.  He is unsure if the hardware in his leg is okay from his previous knee replacement.  He denies any other injuries.  No head injury.    Past Medical History:  Diagnosis Date  . Allergy   . Anxiety   . Aortic stenosis   . BPH (benign prostatic hypertrophy)   . Diaphragm dysfunction    right frozen diaphragm  . Diverticulitis   . GERD (gastroesophageal reflux disease)   . Hyperlipidemia   . Kidney stones   . Obstructive sleep apnea    CPAP  . Osteoarthritis   . Osteoporosis   . Sleep apnea    cpap nightly  . Spinal stenosis     Patient Active Problem List   Diagnosis Date Noted  . Aortic atherosclerosis ([place]) 12/25/2017  . Aortic valve stenosis 11/20/2017  . Ascending aortic aneurysm ([place]) 11/20/2017  . Fatigue 06/27/2017  . Rosacea 03/12/2015  . Advance directive discussed with patient 12/15/2014  . Arthritis of knee, degenerative 07/23/2014  . Obstructive sleep apnea   . Class 1 obesity 03/13/2014  . Benign fibroma of prostate 03/13/2014  . Lumbar radicular pain 09/12/2012  . Osteoarthritis, hand 10/20/2011  . Routine general medical examination at a health care facility 07/26/2011  . Osteoarthritis, multiple sites 07/10/2009  . Obesity 12/04/2008  . Essential hypertension 08/13/2008  . BARRETTS ESOPHAGUS 08/13/2008  . BPH with obstruction/lower urinary tract symptoms 12/12/2007  . SPINAL STENOSIS, LUMBAR 09/21/2007  . Hyperlipemia 10/09/2006  . ALLERGIC RHINITIS  10/09/2006  . GERD 10/09/2006  . Diverticulosis 10/09/2006    Past Surgical History:  Procedure Laterality Date  . ESOPHAGOGASTRODUODENOSCOPY     colon--6/04, Barrett's--7/05, Barrett's but no cancer--4/08  . EYE MUSCLE SURGERY     L eye muscle surgery--2/01  . JOINT REPLACEMENT  1/12   left TKR  . KNEE ARTHROSCOPY     7/ 09 Arthroscopy left knee--Dr Noemi Chapel  . LIPOMA EXCISION Left 2010   compressing nerve by spine---Dr Terald Sleeper at [facility]  . ROTATOR CUFF REPAIR     R rotator cuff repair--1/00  Noemi Chapel)  . ROTATOR CUFF REPAIR Left ~2005 & 11/15   biceps repair also in 2015  . TENDON REPAIR     Left knee tendon repair--1966  . TOTAL KNEE ARTHROPLASTY Right 4/16  . VASECTOMY     Vasectomy/spermatocele/?testicular cyst--1970    Prior to Admission medications   Medication Sig Start Date End Date Taking? Authorizing Provider  acetaminophen (TYLENOL) 500 MG tablet Take 500 mg by mouth every 8 (eight) hours as needed (for pain).     [provider]  alfuzosin (UROXATRAL) 10 MG 24 hr tablet Take 1 tablet by mouth daily. 06/26/17   [provider]  aspirin 81 MG tablet Take 81 mg by mouth daily.      [provider]  diclofenac (VOLTAREN) 75 MG EC tablet Take 1 tablet (75 mg total) by mouth 2 (two) times daily. 08/03/18   Venia Carbon, MD  doxazosin (  CARDURA) 2 MG tablet Take 2 mg by mouth daily.    [provider]  esomeprazole (NEXIUM) 40 MG capsule TAKE 1 CAPSULE DAILY BEFOREBREAKFAST 06/29/18   Venia Carbon, MD  eszopiclone (LUNESTA) 2 MG TABS tablet Take 1 tablet (2 mg total) by mouth at bedtime as needed. for sleep 06/29/18   Viviana Simpler I, MD  finasteride (PROSCAR) 5 MG tablet Take 1 tablet (5 mg total) by mouth daily. 02/16/18   Venia Carbon, MD  fluticasone (FLONASE) 50 MCG/ACT nasal spray Place 1 spray into both nostrils daily. 08/22/18 08/22/19  Lesleigh Noe, MD  pravastatin (PRAVACHOL) 40 MG tablet Take 1 tablet (40 mg total) by  mouth every evening. 01/02/18   Minus Breeding, MD  traMADol (ULTRAM) 50 MG tablet TAKE ONE TABLET BY MOUTH THREE TIMES A DAY AS NEEDED 09/10/18   Venia Carbon, MD    Allergies Anesthetics, amide; Atorvastatin; Oxycodone; Simvastatin; Latex; and Statins  Family History  Problem Relation Age of Onset  . Hypertension Mother   . Breast cancer Mother   . Colon polyps Mother   . Coronary artery disease Mother   . Diabetes Neg Hx   . Colon cancer Neg Hx   . Esophageal cancer Neg Hx   . Gallbladder disease Neg Hx   . Kidney disease Neg Hx     Social History Social History   Tobacco Use  . Smoking status: Never Smoker  . Smokeless tobacco: Never Used  Substance Use Topics  . Alcohol use: Yes    Alcohol/week: 0.0 standard drinks    Comment: occassional wine  . Drug use: No    Review of Systems  Constitutional: No fever/chills Eyes: No visual changes. ENT: No sore throat. Respiratory: Denies cough Genitourinary: Negative for dysuria. Musculoskeletal: Negative for back pain.  Positive for left leg pain Skin: Negative for rash.    ____________________________________________   PHYSICAL EXAM:  VITAL SIGNS: ED Triage Vitals  Enc Vitals Group     BP 10/15/18 1322 136/81     Pulse Rate 10/15/18 1322 80     Resp 10/15/18 1322 17     Temp 10/15/18 1322 98.9 F (37.2 C)     Temp Source 10/15/18 1322 Oral     SpO2 10/15/18 1322 97 %     Weight 10/15/18 1323 205 lb (93 kg)     Height 10/15/18 1323 5\' 8"  (1.727 m)     Head Circumference --      Peak Flow --      Pain Score 10/15/18 1323 5     Pain Loc --      Pain Edu? --      Excl. in [place]? --     Constitutional: Alert and oriented. Well appearing and in no acute distress. Eyes: Conjunctivae are normal.  Head: Atraumatic. Nose: No congestion/rhinnorhea. Mouth/Throat: Mucous membranes are moist.   Neck:  supple no lymphadenopathy noted Cardiovascular: Normal rate, regular rhythm.  Respiratory: Normal  respiratory effort.  No retractions GU: deferred Musculoskeletal: FROM all extremities, warm and well perfused, large amount of swelling noted below the left knee and at the left thigh, neurovascular intact Neurologic:  Normal speech and language.  Skin:  Skin is warm, dry and intact. No rash noted. Psychiatric: Mood and affect are normal. Speech and behavior are normal.  ____________________________________________   LABS (all labs ordered are listed, but only abnormal results are displayed)  Labs Reviewed - No data to display ____________________________________________   ____________________________________________  RADIOLOGY  X-ray of left femur is negative, x-ray of the left knee is negative  ____________________________________________   PROCEDURES  Procedure(s) performed: Ace wrap and knee immobilizer applied   Procedures    ____________________________________________   INITIAL IMPRESSION / ASSESSMENT AND PLAN / ED COURSE  Pertinent labs & imaging results that were available during my care of the patient were reviewed by me and considered in my medical decision making (see chart for details).   Patient 78 year old male presents emergency department after falling.  Patient has complaint of left knee and left thigh pain.  Physical exam patient appears well.  There is tenderness noted to the left thigh and left knee.  X-rays of the left femur and left knee are both negative  I explained the findings to the patient.  He was placed in a Ace wrap and knee immobilizer.  He is to follow-up with his regular orthopedist at [facility] if needed.  Return emergency department worsening.  We did discuss compartment syndrome due to the amount of swelling.  He is to return if worsening.  States he understands was discharged stable condition.     As part of my medical decision making, I reviewed the following data within the [facility] notes reviewed and  incorporated, Old chart reviewed, Radiograph reviewed x-rays are negative, Notes from prior ED visits and [place] Controlled Substance Database  ____________________________________________   FINAL CLINICAL IMPRESSION(S) / ED DIAGNOSES  Final diagnoses:  Contusion of left knee, initial encounter      NEW MEDICATIONS STARTED DURING THIS VISIT:  Discharge Medication List as of 10/15/2018  3:30 PM       Note:  This document was prepared using Dragon voice recognition software and may include unintentional dictation errors.    Versie Starks, PA-C 10/15/18 1558    Duffy Bruce, MD 10/15/18 2256

## 2018-10-17 DIAGNOSIS — R490 Dysphonia: Secondary | ICD-10-CM | POA: Diagnosis not present

## 2018-10-17 DIAGNOSIS — K1123 Chronic sialoadenitis: Secondary | ICD-10-CM | POA: Diagnosis not present

## 2018-10-17 DIAGNOSIS — H6120 Impacted cerumen, unspecified ear: Secondary | ICD-10-CM | POA: Diagnosis not present

## 2018-10-30 ENCOUNTER — Other Ambulatory Visit: Payer: Self-pay | Admitting: Internal Medicine

## 2018-10-30 NOTE — Telephone Encounter (Signed)
Last filled 09-10-18 #90 Last OV 08-22-18 Next OV 02-01-19 [facility]

## 2018-12-10 ENCOUNTER — Other Ambulatory Visit: Payer: Self-pay | Admitting: Cardiology

## 2018-12-10 DIAGNOSIS — Z01812 Encounter for preprocedural laboratory examination: Secondary | ICD-10-CM

## 2018-12-13 DIAGNOSIS — N401 Enlarged prostate with lower urinary tract symptoms: Secondary | ICD-10-CM | POA: Diagnosis not present

## 2018-12-14 ENCOUNTER — Other Ambulatory Visit: Payer: Self-pay | Admitting: Internal Medicine

## 2018-12-14 NOTE — Telephone Encounter (Signed)
Last filled 10-30-18 #90 Last OV 08-22-18 Next OV 02-01-19 [facility]

## 2018-12-20 DIAGNOSIS — N401 Enlarged prostate with lower urinary tract symptoms: Secondary | ICD-10-CM | POA: Diagnosis not present

## 2018-12-20 DIAGNOSIS — R351 Nocturia: Secondary | ICD-10-CM | POA: Diagnosis not present

## 2018-12-21 ENCOUNTER — Other Ambulatory Visit: Payer: Medicare Other | Admitting: *Deleted

## 2018-12-21 ENCOUNTER — Other Ambulatory Visit: Payer: Self-pay

## 2018-12-21 ENCOUNTER — Other Ambulatory Visit: Payer: Medicare Other

## 2018-12-21 DIAGNOSIS — Z01812 Encounter for preprocedural laboratory examination: Secondary | ICD-10-CM

## 2018-12-21 LAB — BASIC METABOLIC PANEL
BUN/Creatinine Ratio: 18 (ref 10–24)
BUN: 18 mg/dL (ref 8–27)
CO2: 24 mmol/L (ref 20–29)
Calcium: 9.1 mg/dL (ref 8.6–10.2)
Chloride: 101 mmol/L (ref 96–106)
Creatinine, Ser: 0.99 mg/dL (ref 0.76–1.27)
GFR calc Af Amer: 85 mL/min/{1.73_m2} (ref >59)
GFR calc non Af Amer: 73 mL/min/{1.73_m2} (ref >59)
Glucose: 85 mg/dL (ref 65–99)
Potassium: 4.6 mmol/L (ref 3.5–5.2)
Sodium: 139 mmol/L (ref 134–144)

## 2018-12-25 DIAGNOSIS — R229 Localized swelling, mass and lump, unspecified: Secondary | ICD-10-CM | POA: Diagnosis not present

## 2018-12-25 DIAGNOSIS — H6121 Impacted cerumen, right ear: Secondary | ICD-10-CM | POA: Diagnosis not present

## 2018-12-28 ENCOUNTER — Other Ambulatory Visit: Payer: Self-pay | Admitting: Unknown Physician Specialty

## 2018-12-28 DIAGNOSIS — M542 Cervicalgia: Secondary | ICD-10-CM

## 2018-12-28 DIAGNOSIS — H60331 Swimmer's ear, right ear: Secondary | ICD-10-CM | POA: Diagnosis not present

## 2018-12-28 DIAGNOSIS — K1123 Chronic sialoadenitis: Secondary | ICD-10-CM | POA: Diagnosis not present

## 2018-12-31 DIAGNOSIS — H60331 Swimmer's ear, right ear: Secondary | ICD-10-CM | POA: Diagnosis not present

## 2019-01-01 ENCOUNTER — Other Ambulatory Visit: Payer: Self-pay

## 2019-01-01 ENCOUNTER — Ambulatory Visit (INDEPENDENT_AMBULATORY_CARE_PROVIDER_SITE_OTHER)
Admission: RE | Admit: 2019-01-01 | Discharge: 2019-01-01 | Disposition: A | Discharge status: Home / Self Care | Payer: Medicare Other | Source: Ambulatory Visit | Attending: Cardiology | Admitting: Cardiology

## 2019-01-01 ENCOUNTER — Ambulatory Visit (HOSPITAL_COMMUNITY): Payer: Medicare Other | Attending: Cardiovascular Disease

## 2019-01-01 DIAGNOSIS — I7101 Dissection of ascending aorta: Secondary | ICD-10-CM

## 2019-01-01 DIAGNOSIS — I351 Nonrheumatic aortic (valve) insufficiency: Secondary | ICD-10-CM | POA: Diagnosis not present

## 2019-01-01 DIAGNOSIS — I712 Thoracic aortic aneurysm, without rupture: Secondary | ICD-10-CM | POA: Diagnosis not present

## 2019-01-01 MED ORDER — IOHEXOL 350 MG/ML SOLN
100.0000 mL | Freq: Once | INTRAVENOUS | Status: AC | PRN
  Administered 2019-01-01: 14:00:00 100 mL via INTRAVENOUS

## 2019-01-02 ENCOUNTER — Telehealth: Payer: Self-pay

## 2019-01-02 NOTE — Telephone Encounter (Signed)
Called the pt to follow up to see if they were interested in starting the repatha for their cholesterol and the pt stated that they are not interested in starting the repatha and woud have their pcp do lipid panel.

## 2019-01-07 ENCOUNTER — Other Ambulatory Visit: Payer: Self-pay

## 2019-01-07 ENCOUNTER — Ambulatory Visit
Admission: RE | Admit: 2019-01-07 | Discharge: 2019-01-07 | Disposition: A | Discharge status: Home / Self Care | Payer: Medicare Other | Source: Ambulatory Visit | Attending: Unknown Physician Specialty | Admitting: Unknown Physician Specialty

## 2019-01-07 DIAGNOSIS — M79675 Pain in left toe(s): Secondary | ICD-10-CM | POA: Diagnosis not present

## 2019-01-07 DIAGNOSIS — B351 Tinea unguium: Secondary | ICD-10-CM | POA: Diagnosis not present

## 2019-01-07 DIAGNOSIS — R221 Localized swelling, mass and lump, neck: Secondary | ICD-10-CM | POA: Diagnosis not present

## 2019-01-07 DIAGNOSIS — M542 Cervicalgia: Secondary | ICD-10-CM | POA: Diagnosis not present

## 2019-01-07 DIAGNOSIS — M79674 Pain in right toe(s): Secondary | ICD-10-CM | POA: Diagnosis not present

## 2019-01-07 MED ORDER — IOHEXOL 300 MG/ML  SOLN
75.0000 mL | Freq: Once | INTRAMUSCULAR | Status: AC | PRN
  Administered 2019-01-07: 75 mL via INTRAVENOUS

## 2019-01-08 DIAGNOSIS — J392 Other diseases of pharynx: Secondary | ICD-10-CM | POA: Diagnosis not present

## 2019-01-08 DIAGNOSIS — K1123 Chronic sialoadenitis: Secondary | ICD-10-CM | POA: Diagnosis not present

## 2019-01-21 ENCOUNTER — Ambulatory Visit (INDEPENDENT_AMBULATORY_CARE_PROVIDER_SITE_OTHER): Payer: Medicare Other | Admitting: Family Medicine

## 2019-01-21 ENCOUNTER — Other Ambulatory Visit: Payer: Self-pay

## 2019-01-21 ENCOUNTER — Encounter: Payer: Self-pay | Admitting: Family Medicine

## 2019-01-21 VITALS — BP 124/66 | HR 69 | Temp 98.5°F | Ht 68.0 in | Wt 211.0 lb

## 2019-01-21 DIAGNOSIS — R0781 Pleurodynia: Secondary | ICD-10-CM | POA: Diagnosis not present

## 2019-01-21 NOTE — Patient Instructions (Signed)
Ibuprofen 3 tablets over the counter.   Take Tylenol/Acetaminophen ES (500mg ) 2 tabs by mouth three times a day max as needed.   Tramadol OK, too

## 2019-01-21 NOTE — Progress Notes (Signed)
Arthur Moor T. Dirck Butch, MD Primary Care and [place] at [facility] [place], [address] Phone: [phone]  FAX: [phone]  [name] - 78 y.o. male  MRN [id number]  Date of Birth: Jun 13, 1940  Visit Date: 01/21/2019  PCP: Venia Carbon, MD  Referred by: Venia Carbon, MD  Chief Complaint  Patient presents with  . Fall    Friday Night  . Back Pain    very tender low left rib area   Subjective:   [name] is a 78 y.o. very pleasant male patient with Body mass index is 32.08 kg/m. who presents with the following:  Fell on Friday night and now with back pain and area around his ribs.  2 foot step ladder.   Hit the chair and back was flush against hit. Put some deep blue on it. Did ice and then heat.  Then Saturday morning and Sunday morning could hardly move.  Took a lot to get out of bed.   Has been taking voltaren and tramadol.   Started some ibuprofen.  Took a pain pill at 9 PM. Took 3 motrin in the AM.   Past Medical History, Surgical History, Social History, Family History, Problem List, Medications, and Allergies have been reviewed and updated if relevant.  Patient Active Problem List   Diagnosis Date Noted  . Aortic atherosclerosis ([place]) 12/25/2017  . Aortic valve stenosis 11/20/2017  . Ascending aortic aneurysm ([place]) 11/20/2017  . Fatigue 06/27/2017  . Rosacea 03/12/2015  . Advance directive discussed with patient 12/15/2014  . Arthritis of knee, degenerative 07/23/2014  . Obstructive sleep apnea   . Class 1 obesity 03/13/2014  . Benign fibroma of prostate 03/13/2014  . Lumbar radicular pain 09/12/2012  . Osteoarthritis, hand 10/20/2011  . Routine general medical examination at a health care facility 07/26/2011  . Osteoarthritis, multiple sites 07/10/2009  . Obesity 12/04/2008  . Essential hypertension 08/13/2008  . BARRETTS ESOPHAGUS 08/13/2008  . BPH with obstruction/lower  urinary tract symptoms 12/12/2007  . SPINAL STENOSIS, LUMBAR 09/21/2007  . Hyperlipemia 10/09/2006  . ALLERGIC RHINITIS 10/09/2006  . GERD 10/09/2006  . Diverticulosis 10/09/2006    Past Medical History:  Diagnosis Date  . Allergy   . Anxiety   . Aortic stenosis   . BPH (benign prostatic hypertrophy)   . Diaphragm dysfunction    right frozen diaphragm  . Diverticulitis   . GERD (gastroesophageal reflux disease)   . Hyperlipidemia   . Kidney stones   . Obstructive sleep apnea    CPAP  . Osteoarthritis   . Osteoporosis   . Sleep apnea    cpap nightly  . Spinal stenosis     Past Surgical History:  Procedure Laterality Date  . ESOPHAGOGASTRODUODENOSCOPY     colon--6/04, Barrett's--7/05, Barrett's but no cancer--4/08  . EYE MUSCLE SURGERY     L eye muscle surgery--2/01  . JOINT REPLACEMENT  1/12   left TKR  . KNEE ARTHROSCOPY     7/ 09 Arthroscopy left knee--Dr Noemi Chapel  . LIPOMA EXCISION Left 2010   compressing nerve by spine---Dr Terald Sleeper at [facility]  . ROTATOR CUFF REPAIR     R rotator cuff repair--1/00  Noemi Chapel)  . ROTATOR CUFF REPAIR Left ~2005 & 11/15   biceps repair also in 2015  . TENDON REPAIR     Left knee tendon repair--1966  . TOTAL KNEE ARTHROPLASTY Right 4/16  . VASECTOMY     Vasectomy/spermatocele/?testicular  cyst--1970    Social History   Socioeconomic History  . Marital status: Married    Spouse name: Not on file  . Number of children: 3  . Years of education: Not on file  . Highest education level: Not on file  Occupational History  . Occupation: retired    Fish farm manager: RETIRED  Social Needs  . Financial resource strain: Not on file  . Food insecurity    Worry: Not on file    Inability: Not on file  . Transportation needs    Medical: Not on file    Non-medical: Not on file  Tobacco Use  . Smoking status: Never Smoker  . Smokeless tobacco: Never Used  Substance and Sexual Activity  . Alcohol use: Yes    Alcohol/week: 0.0 standard drinks     Comment: occassional wine  . Drug use: No  . Sexual activity: Not on file  Lifestyle  . Physical activity    Days per week: Not on file    Minutes per session: Not on file  . Stress: Not on file  Relationships  . Social Herbalist on phone: Not on file    Gets together: Not on file    Attends religious service: Not on file    Active member of club or organization: Not on file    Attends meetings of clubs or organizations: Not on file    Relationship status: Not on file  . Intimate partner violence    Fear of current or ex partner: Not on file    Emotionally abused: Not on file    Physically abused: Not on file    Forced sexual activity: Not on file  Other Topics Concern  . Not on file  Social History Narrative   Lives at home with wife      Has living will   Wife is health care POA---alternate daughter [name]   Would accept resuscitation attempts   No tube feeds if cognitively unaware          Family History  Problem Relation Age of Onset  . Hypertension Mother   . Breast cancer Mother   . Colon polyps Mother   . Coronary artery disease Mother   . Diabetes Neg Hx   . Colon cancer Neg Hx   . Esophageal cancer Neg Hx   . Gallbladder disease Neg Hx   . Kidney disease Neg Hx     Allergies  Allergen Reactions  . Anesthetics, Amide Nausea Only    Other reaction(s): Vomiting After surgery patient has problems with nausea and vomiting due to anesthetics  . Atorvastatin     REACTION: myalgias  . Oxycodone Nausea And Vomiting  . Simvastatin     REACTION: joint and stomach pain  . Latex Rash    Local area  . Statins Other (See Comments)    Joint pain    Medication list reviewed and updated in full in [facility].  GEN: No fevers, chills. Nontoxic. Primarily MSK c/o today. MSK: Detailed in the HPI GI: tolerating PO intake without difficulty Neuro: No numbness, parasthesias, or tingling associated. Otherwise the pertinent positives of the ROS are  noted above.   Objective:   BP 124/66   Pulse 69   Temp 98.5 F (36.9 C) (Temporal)   Ht 5\' 8"  (1.727 m)   Wt 211 lb (95.7 kg)   SpO2 96%   BMI 32.08 kg/m    GEN: WDWN, NAD, Non-toxic, Alert & Oriented  x 3 HEENT: Atraumatic, Normocephalic.  Ears and Nose: No external deformity. EXTR: No clubbing/cyanosis/edema NEURO: Normal gait.  PSYCH: Normally interactive. Conversant. Not depressed or anxious appearing.  Calm demeanor.    The patient is tender to palpation of the left lower rib as well as some of the soft adjacent tissues.  Radiology:  Assessment and Plan:     ICD-10-CM   1. Rib pain on left side  R07.81    I reassured him, basically soft tissue injury, cannot fully exclude a small rib fracture, but I think he should do well with patient conservative care.  Patient Instructions  Ibuprofen 3 tablets over the counter.   Take Tylenol/Acetaminophen ES (500mg ) 2 tabs by mouth three times a day max as needed.   Tramadol OK, too    Follow-up: No follow-ups on file.  No orders of the defined types were placed in this encounter.  No orders of the defined types were placed in this encounter.   Signed,  Maud Deed. Eugine Bubb, MD   Outpatient Encounter Medications as of 01/21/2019  Medication Sig  . acetaminophen (TYLENOL) 500 MG tablet Take 500 mg by mouth every 8 (eight) hours as needed (for pain).   Marland Kitchen alfuzosin (UROXATRAL) 10 MG 24 hr tablet Take 1 tablet by mouth daily.  Marland Kitchen aspirin 81 MG tablet Take 81 mg by mouth daily.    . diclofenac (VOLTAREN) 75 MG EC tablet Take 1 tablet (75 mg total) by mouth 2 (two) times daily.  Marland Kitchen doxazosin (CARDURA) 2 MG tablet Take 2 mg by mouth daily.  Marland Kitchen esomeprazole (NEXIUM) 40 MG capsule TAKE 1 CAPSULE DAILY BEFOREBREAKFAST  . eszopiclone (LUNESTA) 2 MG TABS tablet Take 1 tablet (2 mg total) by mouth at bedtime as needed. for sleep  . finasteride (PROSCAR) 5 MG tablet Take 1 tablet (5 mg total) by mouth daily.  . fluticasone  (FLONASE) 50 MCG/ACT nasal spray Place 1 spray into both nostrils daily.  . pravastatin (PRAVACHOL) 40 MG tablet Take 1 tablet (40 mg total) by mouth every evening.  . traMADol (ULTRAM) 50 MG tablet TAKE ONE TABLET BY MOUTH THREE TIMES A DAY AS NEEDED   No facility-administered encounter medications on file as of 01/21/2019.

## 2019-01-22 ENCOUNTER — Encounter: Payer: Self-pay | Admitting: Family Medicine

## 2019-01-28 ENCOUNTER — Other Ambulatory Visit: Payer: Self-pay

## 2019-01-28 MED ORDER — PRAVASTATIN SODIUM 40 MG PO TABS: 40.0000 mg | ORAL_TABLET | Freq: Every evening | ORAL | 0 refills | Status: DC

## 2019-02-01 ENCOUNTER — Ambulatory Visit (INDEPENDENT_AMBULATORY_CARE_PROVIDER_SITE_OTHER): Payer: Medicare Other | Admitting: Internal Medicine

## 2019-02-01 ENCOUNTER — Other Ambulatory Visit: Payer: Self-pay

## 2019-02-01 ENCOUNTER — Encounter: Payer: Self-pay | Admitting: Internal Medicine

## 2019-02-01 VITALS — BP 122/76 | HR 56 | Temp 98.5°F | Ht 66.75 in | Wt 208.0 lb

## 2019-02-01 DIAGNOSIS — I10 Essential (primary) hypertension: Secondary | ICD-10-CM

## 2019-02-01 DIAGNOSIS — Z0000 Encounter for general adult medical examination without abnormal findings: Secondary | ICD-10-CM | POA: Diagnosis not present

## 2019-02-01 DIAGNOSIS — Z7189 Other specified counseling: Secondary | ICD-10-CM | POA: Diagnosis not present

## 2019-02-01 DIAGNOSIS — M48061 Spinal stenosis, lumbar region without neurogenic claudication: Secondary | ICD-10-CM

## 2019-02-01 DIAGNOSIS — I700 Atherosclerosis of aorta: Secondary | ICD-10-CM

## 2019-02-01 DIAGNOSIS — K2270 Barrett's esophagus without dysplasia: Secondary | ICD-10-CM | POA: Diagnosis not present

## 2019-02-01 NOTE — Progress Notes (Signed)
Hearing Screening   125Hz  250Hz  500Hz  1000Hz  2000Hz  3000Hz  4000Hz  6000Hz  8000Hz   Right ear:   20 25 20  20     Left ear:   20 20 20   0    Vision Screening Comments: Appt October 2020

## 2019-02-01 NOTE — Progress Notes (Signed)
Subjective:    Patient ID: [name], male    DOB: [birth year], 78 y.o.   MRN: [id number]  HPI Here for Medicare wellness and follow up of chronic health conditions Reviewed form and advanced directives Reviewed other doctors No alcohol or tobacco Not exercising much since COVID--due to gym closing Vision is okay--needs new lenses. Early cataracts--wondering if they need Rx Hearing is okay Has fallen twice--- attacked by rooster and lost balance. Hematoma on knee. Minor fall 2 weeks ago --slipped on stepladder. Saw Dr Lorelei Pont for bruising No depression or anhedonia Independent with instrumental ADLs--wife ill so he is doing it all No sig memory issues  Ongoing back pain Uses the tramadol regularly---this allows him to do yard work, etc  Still sees Dr Percival Spanish Continues on statin for his atherosclerosis and aneurysm  Seeing Dr Tami Ribas about fluid Got drops and antibiotic and it improved  Voids well on his triple prostate Rx Flow is "great" Nocturia x 1-2  No heartburn on nexium No dysphagia  Current Outpatient Medications on File Prior to Visit  Medication Sig Dispense Refill  . acetaminophen (TYLENOL) 500 MG tablet Take 500 mg by mouth every 8 (eight) hours as needed (for pain).     Marland Kitchen alfuzosin (UROXATRAL) 10 MG 24 hr tablet Take 1 tablet by mouth daily.    Marland Kitchen aspirin 81 MG tablet Take 81 mg by mouth daily.      . diclofenac (VOLTAREN) 75 MG EC tablet Take 1 tablet (75 mg total) by mouth 2 (two) times daily. 180 tablet 3  . doxazosin (CARDURA) 2 MG tablet Take 2 mg by mouth daily.    Marland Kitchen esomeprazole (NEXIUM) 40 MG capsule TAKE 1 CAPSULE DAILY BEFOREBREAKFAST 30 capsule 11  . finasteride (PROSCAR) 5 MG tablet Take 1 tablet (5 mg total) by mouth daily. 90 tablet 3  . fluticasone (FLONASE) 50 MCG/ACT nasal spray Place 1 spray into both nostrils daily. 16 g 1  . pravastatin (PRAVACHOL) 40 MG tablet Take 1 tablet (40 mg total) by mouth every evening. 30 tablet 0  .  traMADol (ULTRAM) 50 MG tablet TAKE ONE TABLET BY MOUTH THREE TIMES A DAY AS NEEDED 90 tablet 0   No current facility-administered medications on file prior to visit.     Allergies  Allergen Reactions  . Anesthetics, Amide Nausea Only    Other reaction(s): Vomiting After surgery patient has problems with nausea and vomiting due to anesthetics  . Atorvastatin     REACTION: myalgias  . Oxycodone Nausea And Vomiting  . Simvastatin     REACTION: joint and stomach pain  . Latex Rash    Local area  . Statins Other (See Comments)    Joint pain    Past Medical History:  Diagnosis Date  . Allergy   . Anxiety   . Aortic stenosis   . BPH (benign prostatic hypertrophy)   . Diaphragm dysfunction    right frozen diaphragm  . Diverticulitis   . GERD (gastroesophageal reflux disease)   . Hyperlipidemia   . Kidney stones   . Obstructive sleep apnea    CPAP  . Osteoarthritis   . Osteoporosis   . Sleep apnea    cpap nightly  . Spinal stenosis     Past Surgical History:  Procedure Laterality Date  . ESOPHAGOGASTRODUODENOSCOPY     colon--6/04, Barrett's--7/05, Barrett's but no cancer--4/08  . EYE MUSCLE SURGERY     L eye muscle surgery--2/01  . JOINT REPLACEMENT  1/12  left TKR  . KNEE ARTHROSCOPY     7/ 09 Arthroscopy left knee--Dr Noemi Chapel  . LIPOMA EXCISION Left 2010   compressing nerve by spine---Dr Terald Sleeper at [facility]  . ROTATOR CUFF REPAIR     R rotator cuff repair--1/00  Noemi Chapel)  . ROTATOR CUFF REPAIR Left ~2005 & 11/15   biceps repair also in 2015  . TENDON REPAIR     Left knee tendon repair--1966  . TOTAL KNEE ARTHROPLASTY Right 4/16  . VASECTOMY     Vasectomy/spermatocele/?testicular cyst--1970    Family History  Problem Relation Age of Onset  . Hypertension Mother   . Breast cancer Mother   . Colon polyps Mother   . Coronary artery disease Mother   . Diabetes Neg Hx   . Colon cancer Neg Hx   . Esophageal cancer Neg Hx   . Gallbladder disease Neg Hx   .  Kidney disease Neg Hx     Social History   Socioeconomic History  . Marital status: Married    Spouse name: Not on file  . Number of children: 3  . Years of education: Not on file  . Highest education level: Not on file  Occupational History  . Occupation: retired    Fish farm manager: RETIRED  Social Needs  . Financial resource strain: Not on file  . Food insecurity    Worry: Not on file    Inability: Not on file  . Transportation needs    Medical: Not on file    Non-medical: Not on file  Tobacco Use  . Smoking status: Never Smoker  . Smokeless tobacco: Never Used  Substance and Sexual Activity  . Alcohol use: Yes    Alcohol/week: 0.0 standard drinks    Comment: occassional wine  . Drug use: No  . Sexual activity: Not on file  Lifestyle  . Physical activity    Days per week: Not on file    Minutes per session: Not on file  . Stress: Not on file  Relationships  . Social Herbalist on phone: Not on file    Gets together: Not on file    Attends religious service: Not on file    Active member of club or organization: Not on file    Attends meetings of clubs or organizations: Not on file    Relationship status: Not on file  . Intimate partner violence    Fear of current or ex partner: Not on file    Emotionally abused: Not on file    Physically abused: Not on file    Forced sexual activity: Not on file  Other Topics Concern  . Not on file  Social History Narrative   Lives at home with wife      Has living will   Wife is health care POA---alternate daughter [name]   Would accept resuscitation attempts   No tube feeds if cognitively unaware         Review of Systems Appetite is good Weight stable Sleeps well Teeth are okay--sees dentist Hasn't seen derm in a long time--has some spots on trunk to be checked Bowels are fine--no blood No chest pain or SOB No dizziness or syncope No edema    Objective:   Physical Exam  Constitutional: He is oriented to  person, place, and time. He appears well-developed. No distress.  HENT:  Mouth/Throat: Oropharynx is clear and moist. No oropharyngeal exudate.  Neck: No thyromegaly present.  Cardiovascular: Normal rate, regular rhythm, normal heart  sounds and intact distal pulses. Exam reveals no gallop.  No murmur heard. Respiratory: Effort normal and breath sounds normal. No respiratory distress. He has no wheezes. He has no rales.  GI: Soft. There is no abdominal tenderness.  Musculoskeletal:        General: No tenderness or edema.  Lymphadenopathy:    He has no cervical adenopathy.  Neurological: He is alert and oriented to person, place, and time.  President--- "Dwaine Deter, [place]" [phone] [id number] Recall 2/3  Skin: No rash noted. No erythema.  Psychiatric: He has a normal mood and affect. His behavior is normal.           Assessment & Plan:

## 2019-02-01 NOTE — Assessment & Plan Note (Signed)
BP Readings from Last 3 Encounters:  02/01/19 122/76  01/21/19 124/66  10/15/18 136/81   Good control

## 2019-02-01 NOTE — Assessment & Plan Note (Signed)
I have personally reviewed the Medicare Annual Wellness questionnaire and have noted 1. The patient's medical and social history 2. Their use of alcohol, tobacco or illicit drugs 3. Their current medications and supplements 4. The patient's functional ability including ADL's, fall risks, home safety risks and hearing or visual             impairment. 5. Diet and physical activities 6. Evidence for depression or mood disorders  The patients weight, height, BMI and visual acuity have been recorded in the chart I have made referrals, counseling and provided education to the patient based review of the above and I have provided the pt with a written personalized care plan for preventive services.  I have provided you with a copy of your personalized plan for preventive services. Please take the time to review along with your updated medication list.  Prefers no flu vaccine Discussed fitness Will get callback for colon next year Recommended shingrix at pharmacy No PSA due to age

## 2019-02-01 NOTE — Assessment & Plan Note (Signed)
Is on statin. 

## 2019-02-01 NOTE — Assessment & Plan Note (Signed)
No symptoms on PPI Should continue this daily

## 2019-02-01 NOTE — Assessment & Plan Note (Signed)
See social history 

## 2019-02-01 NOTE — Assessment & Plan Note (Signed)
Does okay with the tramadol 

## 2019-02-08 ENCOUNTER — Other Ambulatory Visit: Payer: Self-pay | Admitting: Internal Medicine

## 2019-02-08 NOTE — Telephone Encounter (Signed)
Last filled 12-14-18 #90 Last OV 02-01-19 Next OV 02-07-20 [facility]

## 2019-03-06 ENCOUNTER — Other Ambulatory Visit: Payer: Self-pay

## 2019-03-06 ENCOUNTER — Other Ambulatory Visit: Payer: Self-pay | Admitting: Internal Medicine

## 2019-03-06 ENCOUNTER — Encounter: Payer: Self-pay | Admitting: Internal Medicine

## 2019-03-06 ENCOUNTER — Ambulatory Visit (INDEPENDENT_AMBULATORY_CARE_PROVIDER_SITE_OTHER): Payer: Medicare Other | Admitting: Internal Medicine

## 2019-03-06 DIAGNOSIS — F39 Unspecified mood [affective] disorder: Secondary | ICD-10-CM | POA: Diagnosis not present

## 2019-03-06 MED ORDER — PRAVASTATIN SODIUM 40 MG PO TABS: 40.0000 mg | ORAL_TABLET | Freq: Every evening | ORAL | 3 refills | Status: DC

## 2019-03-06 MED ORDER — ALFUZOSIN HCL ER 10 MG PO TB24: 10.0000 mg | ORAL_TABLET | Freq: Every day | ORAL | 3 refills | Status: AC

## 2019-03-06 MED ORDER — ESOMEPRAZOLE MAGNESIUM 40 MG PO CPDR: DELAYED_RELEASE_CAPSULE | ORAL | 3 refills | Status: DC

## 2019-03-06 MED ORDER — ALPRAZOLAM 0.25 MG PO TABS: 0.2500 mg | ORAL_TABLET | Freq: Two times a day (BID) | ORAL | 0 refills | Status: DC | PRN

## 2019-03-06 NOTE — Telephone Encounter (Signed)
[facility] is not covered by his insurance. Called pt and advised him. We looked at [facility] and he can get it at [facility] cheap. Sent rx there at his request.

## 2019-03-06 NOTE — Progress Notes (Signed)
Subjective:    Patient ID: [name], male    DOB: 03/07/1941, 78 y.o.   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 acetaminophen (TYLENOL) 500 MG tablet Take 500 mg by mouth every 8 (eight) hours as needed (for pain).     Marland Kitchen alfuzosin (UROXATRAL) 10 MG 24 hr tablet Take 1 tablet by mouth daily.    Marland Kitchen aspirin 81 MG tablet Take 81 mg by mouth daily.      . diclofenac (VOLTAREN) 75 MG EC tablet Take 1 tablet (75 mg total) by mouth 2 (two) times daily. 180 tablet 3  . doxazosin (CARDURA) 2 MG tablet Take 2 mg by mouth daily.    Marland Kitchen esomeprazole (NEXIUM) 40 MG capsule TAKE 1 CAPSULE DAILY BEFOREBREAKFAST 30 capsule 11  . finasteride (PROSCAR) 5 MG tablet TAKE 1 TABLET BY MOUTH DAILY 90 tablet 3  . fluticasone (FLONASE) 50 MCG/ACT nasal spray Place 1 spray into both nostrils daily. 16 g 1  . pravastatin (PRAVACHOL) 40 MG tablet Take 1 tablet (40 mg total) by mouth every evening. 30 tablet 0  . traMADol (ULTRAM) 50 MG tablet TAKE ONE TABLET BY MOUTH THREE TIMES A DAY AS NEEDED 90 tablet 0   No current facility-administered medications on file prior to visit.     Allergies  Allergen Reactions  . Anesthetics, Amide Nausea Only    Other reaction(s): Vomiting After surgery patient has problems with nausea and vomiting due to anesthetics  . Atorvastatin     REACTION: myalgias  . Oxycodone Nausea And Vomiting  . Simvastatin     REACTION: joint and stomach pain  . Latex Rash    Local area   . Statins Other (See Comments)    Joint pain    Past Medical History:  Diagnosis Date  . Allergy   . Anxiety   . Aortic stenosis   . BPH (benign prostatic hypertrophy)   . Diaphragm dysfunction    right frozen diaphragm  . Diverticulitis   . GERD (gastroesophageal reflux disease)   . Hyperlipidemia   . Kidney stones   . Obstructive sleep apnea    CPAP  . Osteoarthritis   . Osteoporosis   . Sleep apnea    cpap nightly  . Spinal stenosis     Past Surgical History:  Procedure Laterality Date  . ESOPHAGOGASTRODUODENOSCOPY     colon--6/04, Barrett's--7/05, Barrett's but no cancer--4/08  . EYE MUSCLE SURGERY     L eye muscle surgery--2/01  . JOINT REPLACEMENT  1/12   left TKR  . KNEE ARTHROSCOPY     7/ 09 Arthroscopy left knee--Dr Noemi Chapel  . LIPOMA EXCISION Left 2010   compressing nerve by spine---[facility] Terald Sleeper at [facility]  . ROTATOR CUFF REPAIR     R rotator cuff repair--1/00  Noemi Chapel)  . ROTATOR CUFF REPAIR Left ~2005 & 11/15   biceps repair also in 2015  . TENDON REPAIR     Left knee tendon repair--1966  .  TOTAL KNEE ARTHROPLASTY Right 4/16  . VASECTOMY     Vasectomy/spermatocele/?testicular cyst--1970    Family History  Problem Relation Age of Onset  . Hypertension Mother   . Breast cancer Mother   . Colon polyps Mother   . Coronary artery disease Mother   . Diabetes Neg Hx   . Colon cancer Neg Hx   . Esophageal cancer Neg Hx   . Gallbladder disease Neg Hx   . Kidney disease Neg Hx     Social History   Socioeconomic History  . Marital status: Married    Spouse name: Not on file  . Number of children: 3  . Years of education: Not on file  . Highest education level: Not on file  Occupational History  . Occupation: retired    Fish farm manager: RETIRED  Social Needs  . Financial resource strain: Not on file  . Food insecurity    Worry: Not on file    Inability: Not on file  . Transportation needs    Medical: Not on file    Non-medical: Not on file  Tobacco  Use  . Smoking status: Never Smoker  . Smokeless tobacco: Never Used  Substance and Sexual Activity  . Alcohol use: Yes    Alcohol/week: 0.0 standard drinks    Comment: occassional wine  . Drug use: No  . Sexual activity: Not on file  Lifestyle  . Physical activity    Days per week: Not on file    Minutes per session: Not on file  . Stress: Not on file  Relationships  . Social Herbalist on phone: Not on file    Gets together: Not on file    Attends religious service: Not on file    Active member of club or organization: Not on file    Attends meetings of clubs or organizations: Not on file    Relationship status: Not on file  . Intimate partner violence    Fear of current or ex partner: Not on file    Emotionally abused: Not on file    Physically abused: Not on file    Forced sexual activity: Not on file  Other Topics Concern  . Not on file  Social History Narrative   Lives at home with wife      Has living will   Wife is health care POA---alternate daughter [name]   Would accept resuscitation attempts   No tube feeds if cognitively unaware         Review of Systems  Eating fine Not sleeping well--mostly due to shoulder pain (also gets some right neck numbness)     Objective:   Physical Exam  Constitutional: He appears well-developed. No distress.  Psychiatric:  No depression Not anxious but relates stress           Assessment & Plan:

## 2019-03-06 NOTE — Assessment & Plan Note (Addendum)
He mostly has situational stress and burn out Nothing to indicate any depression  No anxiety disorder---just having a hard time with wife's illness, church responsibilities Discussed his options--giving up church responsibilities is his only variable (but it is coming to an end in 2 months) Not interested in counseling Will Rx small quantity of xanax for prn use  Exercise can help also

## 2019-04-01 ENCOUNTER — Other Ambulatory Visit: Payer: Self-pay

## 2019-04-01 MED ORDER — TRAMADOL HCL 50 MG PO TABS: 50.0000 mg | ORAL_TABLET | Freq: Three times a day (TID) | ORAL | 0 refills | Status: DC | PRN

## 2019-04-01 NOTE — Telephone Encounter (Signed)
Name of Medication: tramadol 50 mg Name of [facility]: [facility] or Written Date and Quantity: # 58 on 02/08/19 Last Office Visit and Type: acute on 03/06/19 & 02/01/19 AWV Next Office Visit and Type: 02/07/2020 annual  [facility]ill 03/28/19 for tramadol; do not see request in pts chart. Pt has been out of tramadol since 03/29/19. Please advise.

## 2019-04-01 NOTE — Telephone Encounter (Signed)
See other refill message. Thank you

## 2019-04-19 ENCOUNTER — Other Ambulatory Visit: Payer: Self-pay

## 2019-04-19 DIAGNOSIS — Z20828 Contact with and (suspected) exposure to other viral communicable diseases: Secondary | ICD-10-CM | POA: Diagnosis not present

## 2019-04-19 DIAGNOSIS — Z20822 Contact with and (suspected) exposure to covid-19: Secondary | ICD-10-CM

## 2019-04-21 LAB — NOVEL CORONAVIRUS, NAA: SARS-CoV-2, NAA: NOT DETECTED

## 2019-05-09 ENCOUNTER — Other Ambulatory Visit: Payer: Self-pay | Admitting: Internal Medicine

## 2019-05-09 NOTE — Telephone Encounter (Signed)
Last filled 04-01-19 #90 Last OV 03-06-19 Next OV 02-07-20 [facility]  Forwarding to Dr Einar Pheasant in Dr Alla German absence

## 2019-05-29 ENCOUNTER — Ambulatory Visit: Payer: Medicare Other

## 2019-06-11 DIAGNOSIS — H6123 Impacted cerumen, bilateral: Secondary | ICD-10-CM | POA: Diagnosis not present

## 2019-06-11 DIAGNOSIS — J3489 Other specified disorders of nose and nasal sinuses: Secondary | ICD-10-CM | POA: Diagnosis not present

## 2019-06-11 DIAGNOSIS — J392 Other diseases of pharynx: Secondary | ICD-10-CM | POA: Diagnosis not present

## 2019-07-02 ENCOUNTER — Other Ambulatory Visit: Payer: Self-pay | Admitting: Family Medicine

## 2019-07-02 DIAGNOSIS — J3489 Other specified disorders of nose and nasal sinuses: Secondary | ICD-10-CM | POA: Diagnosis not present

## 2019-07-02 DIAGNOSIS — J392 Other diseases of pharynx: Secondary | ICD-10-CM | POA: Diagnosis not present

## 2019-07-02 NOTE — Telephone Encounter (Signed)
Last refilled on 04/29/2019 by Dr Cody-#90 with 0 refill. LOV 03/06/2019 for mood disorder.  Future appointment on 02/07/2020 for AWV

## 2019-07-23 ENCOUNTER — Telehealth: Payer: Self-pay

## 2019-07-23 NOTE — Telephone Encounter (Signed)
I rescheduled patient who had appointment on Thursday at 3:30.  I called patient and he rescheduled his appointment on Friday to Thursday at 3:30.

## 2019-07-23 NOTE — Telephone Encounter (Signed)
My 3:30 on Thursday was supposed to be cancelled and pushed back a month. You can use that space if he wants that (can split into 2 appts) Make sure that appt is changed for the current person in that slot

## 2019-07-23 NOTE — Telephone Encounter (Signed)
Patient scheduled next available office visit with Dr.Letvak on 07/26/19.  Patient said if there's anything sooner to let him know.  He said he only wanted to see Dr.Letvak.

## 2019-07-23 NOTE — Telephone Encounter (Signed)
[facility] Night - Client Nonclinical Telephone Record AccessNurse Client [name] - Client Client Site [place] Physician Viviana Simpler - MD Contact Type Call Who Is Calling Patient / Member / Family / Caregiver Caller Name [name] Caller Phone Number [phone] Patient Name [name] Patient DOB April 30, 1941 Call Type Message Only Information Provided Reason for Call Request to Schedule Office Appointment Initial Comment Caller requesting return call to schedule an appointment. Caller declined triage. Additional Comment Office hours were provided. Possible kidney infection Disp. Time Disposition Final User 07/23/2019 7:37:50 AM General Information Provided Yes Jaclyn Prime Call Closed By: Jaclyn Prime Transaction Date/Time: 07/23/2019 7:35:51 AM (ET)

## 2019-07-25 ENCOUNTER — Encounter: Payer: Self-pay | Admitting: Internal Medicine

## 2019-07-25 ENCOUNTER — Other Ambulatory Visit: Payer: Self-pay

## 2019-07-25 ENCOUNTER — Ambulatory Visit (INDEPENDENT_AMBULATORY_CARE_PROVIDER_SITE_OTHER): Payer: Medicare Other | Admitting: Internal Medicine

## 2019-07-25 VITALS — BP 118/76 | HR 56 | Temp 97.7°F | Ht 68.0 in | Wt 216.0 lb

## 2019-07-25 DIAGNOSIS — R350 Frequency of micturition: Secondary | ICD-10-CM | POA: Diagnosis not present

## 2019-07-25 DIAGNOSIS — M48061 Spinal stenosis, lumbar region without neurogenic claudication: Secondary | ICD-10-CM | POA: Diagnosis not present

## 2019-07-25 LAB — POC URINALSYSI DIPSTICK (AUTOMATED)
Bilirubin, UA: NEGATIVE
Blood, UA: NEGATIVE
Glucose, UA: NEGATIVE
Ketones, UA: NEGATIVE
Leukocytes, UA: NEGATIVE
Nitrite, UA: NEGATIVE
Protein, UA: NEGATIVE
Spec Grav, UA: 1.02 (ref 1.010–1.025)
Urobilinogen, UA: 0.2 E.U./dL
pH, UA: 6 (ref 5.0–8.0)

## 2019-07-25 NOTE — Assessment & Plan Note (Signed)
Having worse pain with radiculopathy on left He does not want to have surgery Would like to try PT again Has had ESI at [facility] limited response

## 2019-07-25 NOTE — Progress Notes (Signed)
Subjective:    Patient ID: [name], male    DOB: January 19, 1941, 79 y.o.   MRN: [id number]  HPI Here due to urinary frequency and worsening back pain This visit occurred during the SARS-CoV-2 public health emergency.  Safety protocols were in place, including screening questions prior to the visit, additional usage of staff PPE, and extensive cleaning of exam room while observing appropriate contact time as indicated for disinfecting solutions.   Concerned that he has a kidney infection Going to the bathroom every 2 hours--but does drink a lot No hematuria Stream is fine with his dual therapy for BPH  Having more back pain Feels he has had sciatic nerve pain on the left since TKR on that side No pain when sitting Hurts in buttock and left posterior thigh when he is standing Tries to walk---pain gets worse if he walks more than 8-9 minutes  Current Outpatient Medications on File Prior to Visit  Medication Sig Dispense Refill  . acetaminophen (TYLENOL) 500 MG tablet Take 500 mg by mouth every 8 (eight) hours as needed (for pain).     Marland Kitchen alfuzosin (UROXATRAL) 10 MG 24 hr tablet Take 1 tablet (10 mg total) by mouth daily. 90 tablet 3  . ALPRAZolam (XANAX) 0.25 MG tablet Take 1 tablet (0.25 mg total) by mouth 2 (two) times daily as needed for anxiety. 20 tablet 0  . aspirin 81 MG tablet Take 81 mg by mouth daily.      . clindamycin (CLEOCIN) 300 MG capsule Take 300 mg by mouth 3 (three) times daily.    . diclofenac (VOLTAREN) 75 MG EC tablet Take 1 tablet (75 mg total) by mouth 2 (two) times daily. 180 tablet 3  . doxazosin (CARDURA) 2 MG tablet Take 2 mg by mouth daily.    Marland Kitchen esomeprazole (NEXIUM) 40 MG capsule TAKE 1 CAPSULE DAILY BEFOREBREAKFAST 90 capsule 3  . finasteride (PROSCAR) 5 MG tablet TAKE 1 TABLET BY MOUTH DAILY 90 tablet 3  . fluticasone (FLONASE) 50 MCG/ACT nasal spray Place 1 spray into both nostrils daily. 16 g 1  . traMADol (ULTRAM) 50 MG tablet TAKE 1 TABLET (50 MG  TOTAL) BY MOUTH 3 (THREE) TIMES DAILY AS NEEDED. 90 tablet 0  . pravastatin (PRAVACHOL) 40 MG tablet Take 1 tablet (40 mg total) by mouth every evening. 90 tablet 3   No current facility-administered medications on file prior to visit.    Allergies  Allergen Reactions  . Anesthetics, Amide Nausea Only    Other reaction(s): Vomiting After surgery patient has problems with nausea and vomiting due to anesthetics  . Atorvastatin     REACTION: myalgias  . Oxycodone Nausea And Vomiting  . Simvastatin     REACTION: joint and stomach pain  . Latex Rash    Local area  . Statins Other (See Comments)    Joint pain    Past Medical History:  Diagnosis Date  . Allergy   . Anxiety   . Aortic stenosis   . BPH (benign prostatic hypertrophy)   . Diaphragm dysfunction    right frozen diaphragm  . Diverticulitis   . GERD (gastroesophageal reflux disease)   . Hyperlipidemia   . Kidney stones   . Obstructive sleep apnea    CPAP  . Osteoarthritis   . Osteoporosis   . Sleep apnea    cpap nightly  . Spinal stenosis     Past Surgical History:  Procedure Laterality Date  . ESOPHAGOGASTRODUODENOSCOPY  colon--6/04, Barrett's--7/05, Barrett's but no cancer--4/08  . EYE MUSCLE SURGERY     L eye muscle surgery--2/01  . JOINT REPLACEMENT  1/12   left TKR  . KNEE ARTHROSCOPY     7/ 09 Arthroscopy left knee--Dr Noemi Chapel  . LIPOMA EXCISION Left 2010   compressing nerve by spine---Dr Terald Sleeper at [facility]  . ROTATOR CUFF REPAIR     R rotator cuff repair--1/00  Noemi Chapel)  . ROTATOR CUFF REPAIR Left ~2005 & 11/15   biceps repair also in 2015  . TENDON REPAIR     Left knee tendon repair--1966  . TOTAL KNEE ARTHROPLASTY Right 4/16  . VASECTOMY     Vasectomy/spermatocele/?testicular cyst--1970    Family History  Problem Relation Age of Onset  . Hypertension Mother   . Breast cancer Mother   . Colon polyps Mother   . Coronary artery disease Mother   . Diabetes Neg Hx   . Colon cancer Neg Hx     . Esophageal cancer Neg Hx   . Gallbladder disease Neg Hx   . Kidney disease Neg Hx     Social History   Socioeconomic History  . Marital status: Married    Spouse name: Not on file  . Number of children: 3  . Years of education: Not on file  . Highest education level: Not on file  Occupational History  . Occupation: retired    Fish farm manager: RETIRED  Tobacco Use  . Smoking status: Never Smoker  . Smokeless tobacco: Never Used  Substance and Sexual Activity  . Alcohol use: Yes    Alcohol/week: 0.0 standard drinks    Comment: occassional wine  . Drug use: No  . Sexual activity: Not on file  Other Topics Concern  . Not on file  Social History Narrative   Lives at home with wife      Has living will   Wife is health care POA---alternate daughter [name]   Would accept resuscitation attempts   No tube feeds if cognitively unaware         Social Determinants of Health   Financial Resource Strain:   . Difficulty of Paying Living Expenses:   Food Insecurity:   . Worried About Charity fundraiser in the Last Year:   . Arboriculturist in the Last Year:   Transportation Needs:   . Film/video editor (Medical):   Marland Kitchen Lack of Transportation (Non-Medical):   Physical Activity:   . Days of Exercise per Week:   . Minutes of Exercise per Session:   Stress:   . Feeling of Stress :   Social Connections:   . Frequency of Communication with Friends and Family:   . Frequency of Social Gatherings with Friends and Family:   . Attends Religious Services:   . Active Member of Clubs or Organizations:   . Attends Archivist Meetings:   Marland Kitchen Marital Status:   Intimate Partner Violence:   . Fear of Current or Ex-Partner:   . Emotionally Abused:   Marland Kitchen Physically Abused:   . Sexually Abused:    Review of Systems No trouble controlling bowels No urine incontinence On clinda for staph infection due to cyst in sinuses Nocturia x 2-3 now---generally had been twice nightly     Objective:   Physical Exam  Constitutional: He appears well-developed. No distress.  GI: Soft. There is no abdominal tenderness.  No suprapubic tenderness           Assessment & Plan:

## 2019-07-25 NOTE — Assessment & Plan Note (Signed)
No evidence of infection on urinalysis Discussed prostatitis=--he has no symptoms suggestive of that (and preferred not doing exam) May be related to increased fluids, etc Reassured---not infection

## 2019-07-26 ENCOUNTER — Ambulatory Visit: Payer: Medicare Other | Admitting: Internal Medicine

## 2019-07-30 DIAGNOSIS — L600 Ingrowing nail: Secondary | ICD-10-CM | POA: Diagnosis not present

## 2019-07-30 DIAGNOSIS — B351 Tinea unguium: Secondary | ICD-10-CM | POA: Diagnosis not present

## 2019-07-30 DIAGNOSIS — M79674 Pain in right toe(s): Secondary | ICD-10-CM | POA: Diagnosis not present

## 2019-07-30 DIAGNOSIS — M79675 Pain in left toe(s): Secondary | ICD-10-CM | POA: Diagnosis not present

## 2019-08-07 ENCOUNTER — Ambulatory Visit (INDEPENDENT_AMBULATORY_CARE_PROVIDER_SITE_OTHER): Payer: Medicare Other | Admitting: Physician Assistant

## 2019-08-07 ENCOUNTER — Encounter: Payer: Self-pay | Admitting: Physician Assistant

## 2019-08-07 ENCOUNTER — Other Ambulatory Visit: Payer: Self-pay | Admitting: Internal Medicine

## 2019-08-07 VITALS — BP 110/60 | HR 64 | Temp 98.7°F | Ht 65.75 in | Wt 219.5 lb

## 2019-08-07 DIAGNOSIS — Z86010 Personal history of colonic polyps: Secondary | ICD-10-CM

## 2019-08-07 DIAGNOSIS — K219 Gastro-esophageal reflux disease without esophagitis: Secondary | ICD-10-CM

## 2019-08-07 DIAGNOSIS — R0989 Other specified symptoms and signs involving the circulatory and respiratory systems: Secondary | ICD-10-CM | POA: Diagnosis not present

## 2019-08-07 DIAGNOSIS — Z01818 Encounter for other preprocedural examination: Secondary | ICD-10-CM

## 2019-08-07 MED ORDER — NA SULFATE-K SULFATE-MG SULF 17.5-3.13-1.6 GM/177ML PO SOLN: 1.0000 | Freq: Once | ORAL | 0 refills | Status: AC

## 2019-08-07 MED ORDER — ESOMEPRAZOLE MAGNESIUM 40 MG PO CPDR: 40.0000 mg | DELAYED_RELEASE_CAPSULE | Freq: Two times a day (BID) | ORAL | 2 refills | Status: DC

## 2019-08-07 NOTE — Patient Instructions (Addendum)
If you are age 78 or older, your body mass index should be between 23-30. Your Body mass index is 35.7 kg/m. If this is out of the aforementioned range listed, please consider follow up with your Primary Care Provider.  If you are age 76 or younger, your body mass index should be between 19-25. Your Body mass index is 35.7 kg/m. If this is out of the aformentioned range listed, please consider follow up with your Primary Care Provider.   We have sent the following medications to your pharmacy for you to pick up at your convenience: Increase Nexium to 40 mg twice daily.   You have been scheduled for an endoscopy and colonoscopy. Please follow the written instructions given to you at your visit today. Please pick up your prep supplies at the pharmacy within the next 1-3 days. If you use inhalers (even only as needed), please bring them with you on the day of your procedure.

## 2019-08-07 NOTE — Progress Notes (Signed)
Chief Complaint: GERD, globus sensation, history of Barrett's  Review of pertinent gastrointestinal problems: 1. Adenomatous colon poylps: 12/16/2014 colonoscopy revealed a sessile polyp in the sigmoid colon, mild diverticulosis in the left colon otherwise normal exam.  Pathology showed adenomatous polyp.  It was recommended he have a repeat colonoscopy in 5 years. 2. Recurrent(?) left sided colon diverticulitis: 12/2017 CT scan locally +for left colon 'acute diverticulitis' (also low grade fever, WBC 16k, left sided abd pain, normal UA), 02/2018 CT scan (in [place] presented with abd pain, found to have UTI) "gas along the non-dependent a sending colonic wall which may represent gas surrounding stool however pneumatosis cannot be excluded.  Minimal inflammatory changes surrounding a diverticulum in the descending colon in the left lower quadrant may represent early changes of diverticulitis"   HPI:    [name] is a 79 year old male with a past medical history as listed below including Barrett's esophagus, known to Dr. Ardis Hughs, who was referred to me by Venia Carbon, MD for a complaint of GERD, globus sensation and history of Barrett's esophagus.     12/16/2014 EGD with slightly irregular Z-line and 2 cm hiatal hernia.  Biopsies positive for Barrett's esophagus.  Repeat recommended in 5 years.  Colonoscopy at the same time with sigmoid tubular adenoma.  Repeat recommended in 5 years.    04/03/2018 patient seen in clinic by Dr. Ardis Hughs at that time his diverticulitis was discussed.  It was noted that his colonoscopy would be due in 12/15/2024 for his history of adenomatous polyps.    Today, the patient presents to clinic and explains that he was doing well with his Nexium 40 mg daily until he started a very strong antibiotic for a "staph sinus infection".  Tells me that this was about 3 weeks ago and ever since taking this antibiotic he has felt like there is a lump constantly in his throat.  Also is getting  reflux symptoms which "I have not had in years".  Tells me that he tries to stay natural and is taking some food enzymes which is calming down a "growling/grinding" in his upper stomach which he has been having for a few months now.  Patient explains that a long time ago he was told that if anything ever changed with his upper symptoms then he needed repeat imaging given his history of Barrett's.    Patient also aware that he is due for colonoscopy.  Currently having normal bowel movements with the occasional use of a natural laxative.    Patient's daughter is a doctor of holistic medicine.  Due to this patient has many holistic practices.  This includes a treatment of "drops" which he has been taking since May of last year for Covid.    Denies fever, chills, weight loss or blood in his stool.      Past Medical History:  Diagnosis Date  . Allergy   . Anxiety   . Aortic stenosis   . Barrett's esophagus   . BPH (benign prostatic hypertrophy)   . Diaphragm dysfunction    right frozen diaphragm  . Diverticulitis   . GERD (gastroesophageal reflux disease)   . Hiatal hernia   . Hx of adenomatous colonic polyps   . Hyperlipidemia   . Kidney stones   . Obstructive sleep apnea    CPAP  . Osteoarthritis   . Osteoporosis   . Sleep apnea    cpap nightly  . Spinal stenosis     Past Surgical History:  Procedure Laterality Date  . ESOPHAGOGASTRODUODENOSCOPY     colon--6/04, Barrett's--7/05, Barrett's but no cancer--4/08  . EYE MUSCLE SURGERY     L eye muscle surgery--2/01  . JOINT REPLACEMENT  1/12   left TKR  . KNEE ARTHROSCOPY     7/ 09 Arthroscopy left knee--Dr Noemi Chapel  . LIPOMA EXCISION Left 2010   compressing nerve by spine---[facility] Terald Sleeper at [facility]  . ROTATOR CUFF REPAIR     R rotator cuff repair--1/00  Noemi Chapel)  . ROTATOR CUFF REPAIR Left ~2005 & 11/15   biceps repair also in 2015  . TENDON REPAIR     Left knee tendon repair--1966  . TOTAL KNEE ARTHROPLASTY Right 4/16  . VASECTOMY      Vasectomy/spermatocele/?testicular cyst--1970    Current Outpatient Medications  Medication Sig Dispense Refill  . acetaminophen (TYLENOL) 500 MG tablet Take 500 mg by mouth every 8 (eight) hours as needed (for pain).     Marland Kitchen alfuzosin (UROXATRAL) 10 MG 24 hr tablet Take 1 tablet (10 mg total) by mouth daily. 90 tablet 3  . ALPRAZolam (XANAX) 0.25 MG tablet Take 1 tablet (0.25 mg total) by mouth 2 (two) times daily as needed for anxiety. 20 tablet 0  . aspirin 81 MG tablet Take 81 mg by mouth daily.      . diclofenac (VOLTAREN) 75 MG EC tablet Take 1 tablet (75 mg total) by mouth 2 (two) times daily. 180 tablet 3  . doxazosin (CARDURA) 2 MG tablet Take 2 mg by mouth daily.    Marland Kitchen esomeprazole (NEXIUM) 40 MG capsule TAKE 1 CAPSULE DAILY BEFOREBREAKFAST 90 capsule 3  . finasteride (PROSCAR) 5 MG tablet TAKE 1 TABLET BY MOUTH DAILY 90 tablet 3  . fluticasone (FLONASE) 50 MCG/ACT nasal spray Place 1 spray into both nostrils daily. 16 g 1  . traMADol (ULTRAM) 50 MG tablet TAKE 1 TABLET (50 MG TOTAL) BY MOUTH 3 (THREE) TIMES DAILY AS NEEDED. 90 tablet 0  . pravastatin (PRAVACHOL) 40 MG tablet Take 1 tablet (40 mg total) by mouth every evening. 90 tablet 3   No current facility-administered medications for this visit.    Allergies as of 08/07/2019 - Review Complete 08/07/2019  Allergen Reaction Noted  . Anesthetics, amide Nausea Only 12/18/2013  . Atorvastatin  04/21/2006  . Oxycodone Nausea And Vomiting 10/28/2014  . Simvastatin  04/21/2006  . Latex Rash 04/07/2014  . Statins Other (See Comments) 11/17/2014    Family History  Problem Relation Age of Onset  . Hypertension Mother   . Breast cancer Mother   . Colon polyps Mother   . Coronary artery disease Mother   . Diabetes Neg Hx   . Colon cancer Neg Hx   . Esophageal cancer Neg Hx   . Gallbladder disease Neg Hx   . Kidney disease Neg Hx     Social History   Socioeconomic History  . Marital status: Married    Spouse name: Not  on file  . Number of children: 3  . Years of education: Not on file  . Highest education level: Not on file  Occupational History  . Occupation: retired    Fish farm manager: RETIRED  Tobacco Use  . Smoking status: Never Smoker  . Smokeless tobacco: Never Used  Substance and Sexual Activity  . Alcohol use: Yes    Alcohol/week: 0.0 standard drinks    Comment: occassional wine  . Drug use: No  . Sexual activity: Not on file  Other Topics Concern  . Not on  file  Social History Narrative   Lives at home with wife      Has living will   Wife is health care POA---alternate daughter [name]   Would accept resuscitation attempts   No tube feeds if cognitively unaware         Social Determinants of Health   Financial Resource Strain:   . Difficulty of Paying Living Expenses:   Food Insecurity:   . Worried About Charity fundraiser in the Last Year:   . Arboriculturist in the Last Year:   Transportation Needs:   . Film/video editor (Medical):   Marland Kitchen Lack of Transportation (Non-Medical):   Physical Activity:   . Days of Exercise per Week:   . Minutes of Exercise per Session:   Stress:   . Feeling of Stress :   Social Connections:   . Frequency of Communication with Friends and Family:   . Frequency of Social Gatherings with Friends and Family:   . Attends Religious Services:   . Active Member of Clubs or Organizations:   . Attends Archivist Meetings:   Marland Kitchen Marital Status:   Intimate Partner Violence:   . Fear of Current or Ex-Partner:   . Emotionally Abused:   Marland Kitchen Physically Abused:   . Sexually Abused:     Review of Systems:    Constitutional: No weight loss, fever or chills Cardiovascular: No chest pain  Respiratory: No SOB Gastrointestinal: See HPI and otherwise negative   Physical Exam:  Vital signs: BP 110/60 (BP Location: Left Arm, Patient Position: Sitting, Cuff Size: Normal)   Pulse 64   Temp 98.7 F (37.1 C)   Ht 5' 5.75" (1.67 m) Comment: height  measured without shoes  Wt 219 lb 8 oz (99.6 kg)   BMI 35.70 kg/m   Constitutional:   Pleasant Caucasian male appears to be in NAD, Well developed, Well nourished, alert and cooperative Respiratory: Respirations even and unlabored. Lungs clear to auscultation bilaterally.   No wheezes, crackles, or rhonchi.  Cardiovascular: Normal S1, S2. No MRG. Regular rate and rhythm. No peripheral edema, cyanosis or pallor.  Gastrointestinal:  Soft, nondistended, nontender. No rebound or guarding. Normal bowel sounds. No appreciable masses or hepatomegaly. Psychiatric:  Demonstrates good judgement and reason without abnormal affect or behaviors.  No recent labs.  Assessment: 1.  GERD: Increase in symptoms over the past 3 weeks ever since taking an antibiotic; consider most likely gastritis 2.  Globus sensation: With above 3.  History of Barrett's esophagus: Last EGD in 2016 with recommendations to repeat in 5 years 4.  History of adenomatous polyps: Last colonoscopy in 2016 with recommendations to repeat in 5 years  Plan: 1.  Scheduled patient for an EGD and colonoscopy with Dr. Ardis Hughs in [facility].  Did discuss risks, benefits, limitations and alternatives and the patient agrees to proceed.  He will be Covid tested 2 days prior to time of procedure. 2.  Increase Nexium to 40 mg twice daily, 30-60 minutes before breakfast and dinner #60 with 3 refills. 3.  Patient will follow in clinic per recommendations from Dr. Ardis Hughs after time of procedures  Ellouise Newer, PA-C [facility] 08/07/2019, 11:40 AM  Cc: Venia Carbon, MD

## 2019-08-07 NOTE — Telephone Encounter (Signed)
Last filled 07-03-19 #90 Last OV 07-25-19 Next OV 02-07-20 [facility]

## 2019-08-08 NOTE — Progress Notes (Signed)
I agree with the above note, plan 

## 2019-08-13 DIAGNOSIS — J392 Other diseases of pharynx: Secondary | ICD-10-CM | POA: Diagnosis not present

## 2019-08-13 DIAGNOSIS — J3489 Other specified disorders of nose and nasal sinuses: Secondary | ICD-10-CM | POA: Diagnosis not present

## 2019-08-17 DIAGNOSIS — M5441 Lumbago with sciatica, right side: Secondary | ICD-10-CM | POA: Diagnosis not present

## 2019-08-17 DIAGNOSIS — G8929 Other chronic pain: Secondary | ICD-10-CM | POA: Diagnosis not present

## 2019-08-17 IMAGING — CT CT ABD-PELV W/ CM
2 of 8 series · 13 of 46 positions shown, 18 images · IV contrast (ISOVUE)
Comparison: None available.

CLINICAL DATA: Initial evaluation for acute left flank pain.

EXAM:
CT ABDOMEN AND PELVIS WITH CONTRAST
TECHNIQUE: Multidetector CT imaging of the abdomen and pelvis was performed
using the standard protocol following bolus administration of
intravenous contrast.
CONTRAST:  100mL [id number] IOPAMIDOL (VVI9NO-6UU) INJECTION 61%

[Series 2: axial st · axial · 0.75mm/px · z∈[+1151,+1576]mm · 10 of 99 slices shown, 15 images]
[im 7/99  soft-tissue]
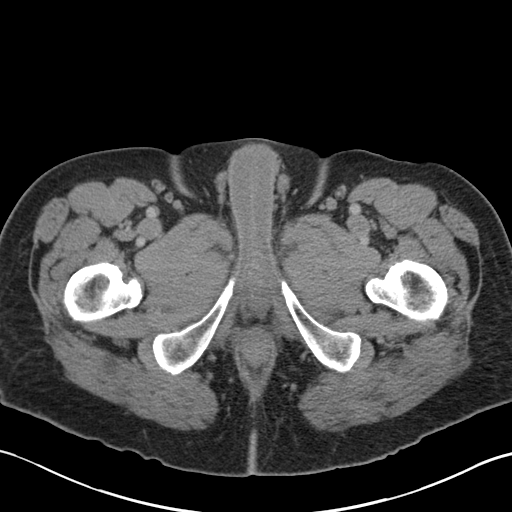
[im 7/99  bone]
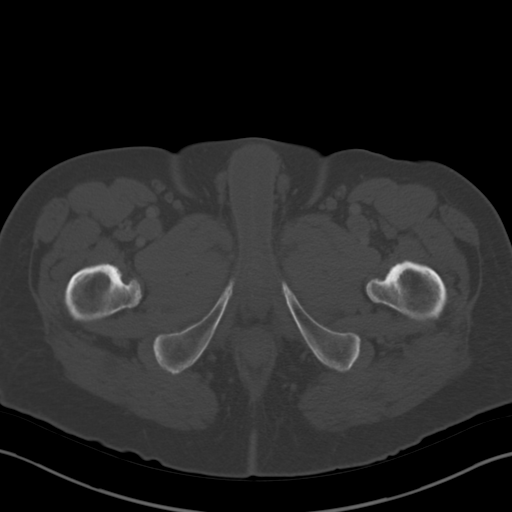
[im 20/99  soft-tissue]
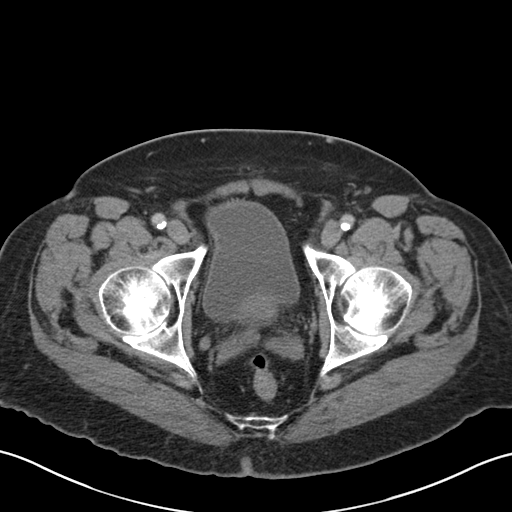
[im 27/99  soft-tissue]
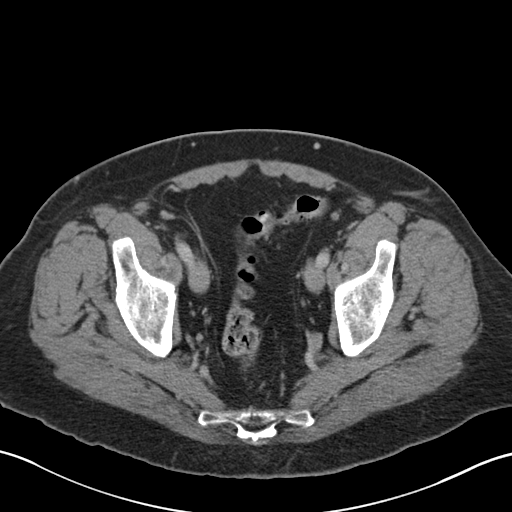
[im 40/99  soft-tissue]
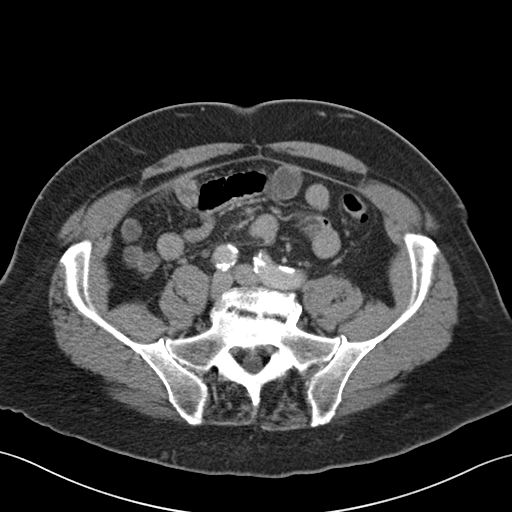
[im 53/99  soft-tissue]
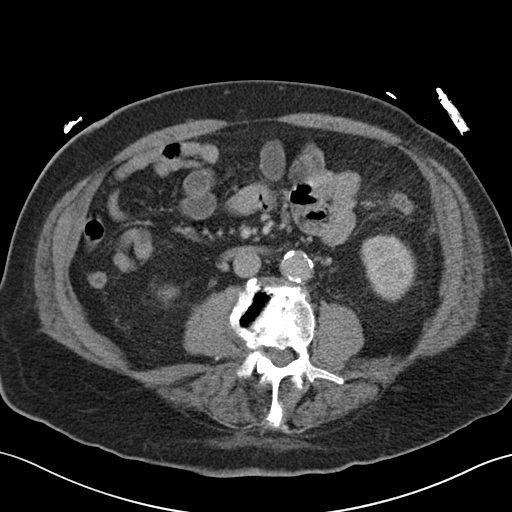
[im 59/99  soft-tissue]
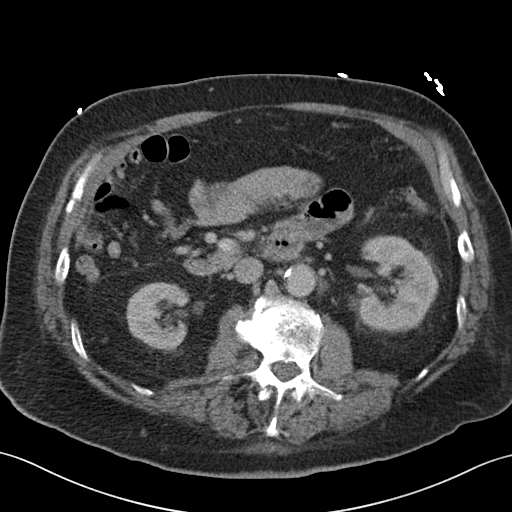
[im 72/99  soft-tissue]
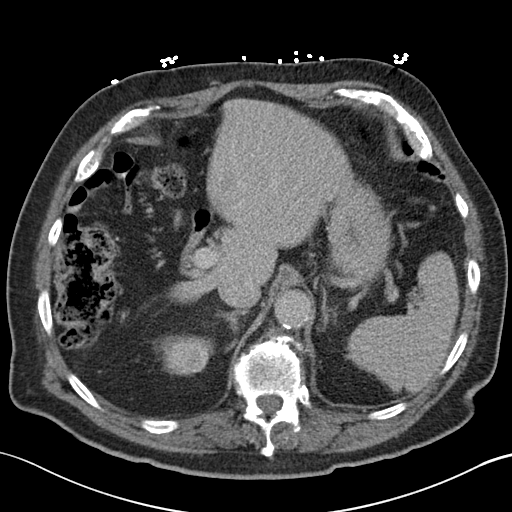
[im 72/99  lung]
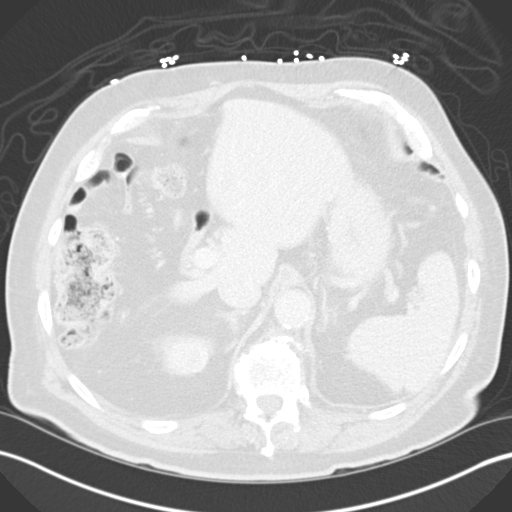
[im 79/99  soft-tissue]
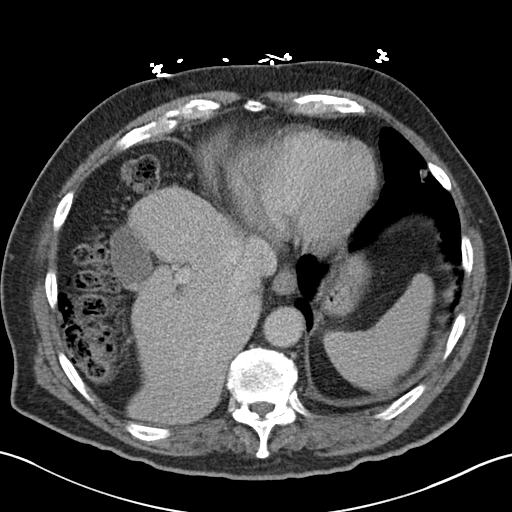
[im 79/99  lung]
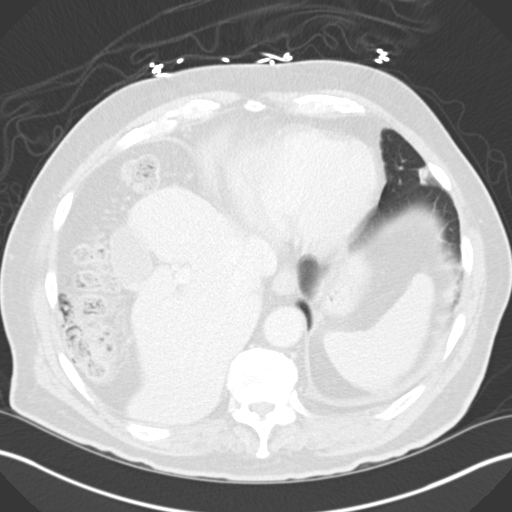
[im 85/99  lung]
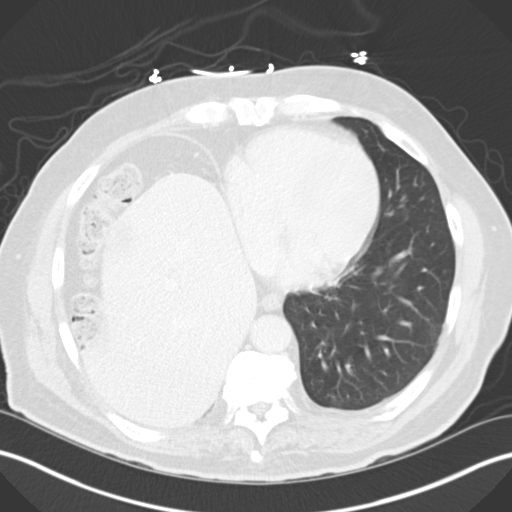
[im 92/99  soft-tissue]
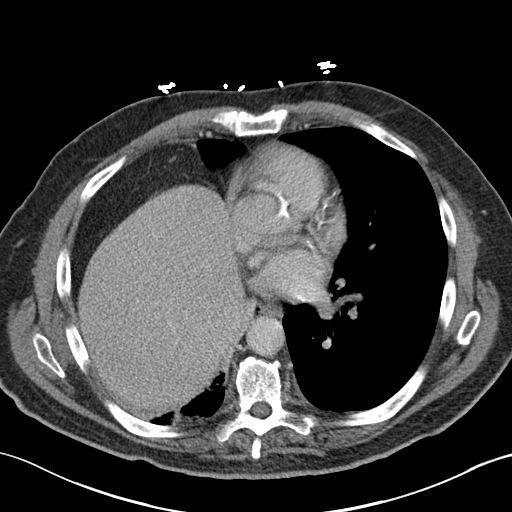
[im 92/99  lung]
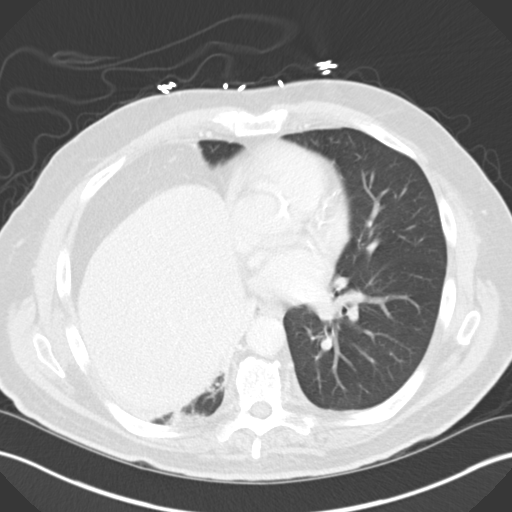
[im 92/99  bone]
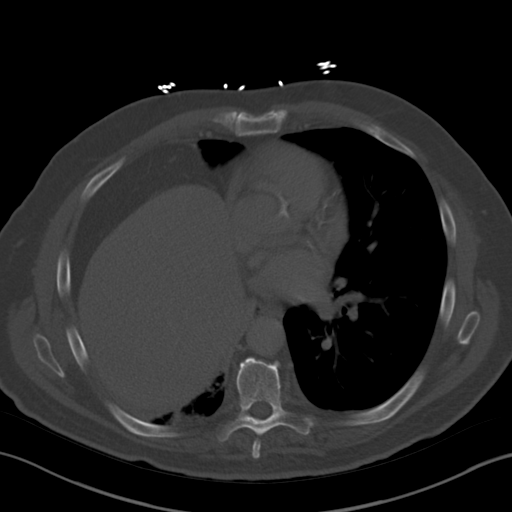

[Series 8: coronal st · coronal · 0.73mm/px · 3 of 96 slices shown]
[im 24/96  soft-tissue]
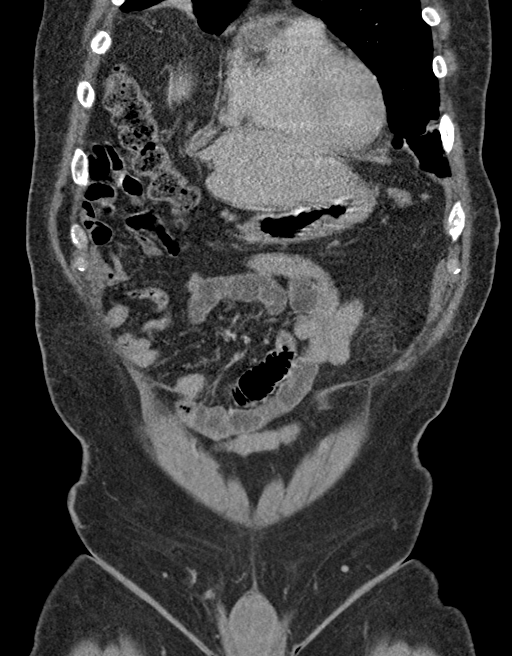
[im 48/96  soft-tissue]
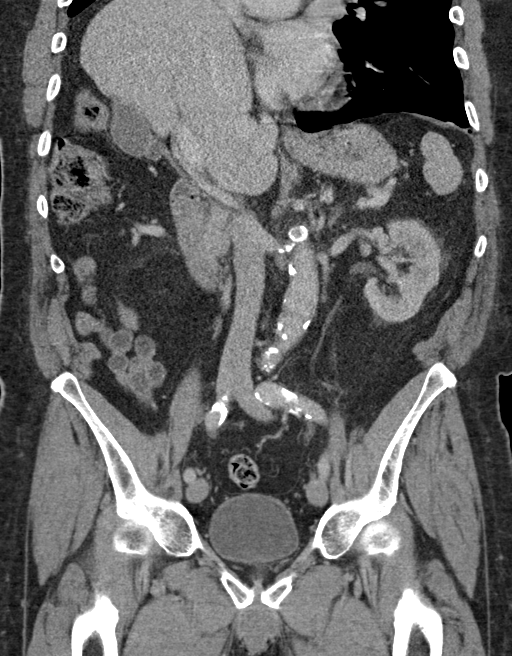
[im 72/96  soft-tissue]
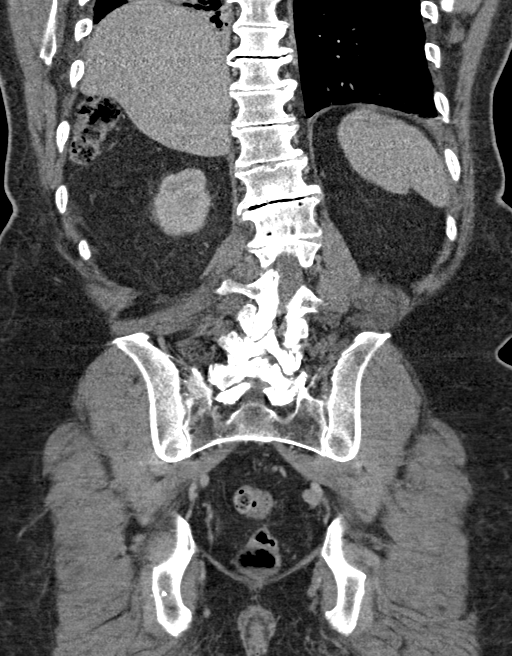

[13 of 46 positions shown; findings below may reference images not displayed]

FINDINGS: Lower chest: Elevation of the right hemidiaphragm with associated
right basilar atelectasis. Mild scattered atelectatic changes within
the left lung base as well. Visualized lungs are otherwise clear.

Hepatobiliary: Liver within normal limits. Layering hyperdensity
within the gallbladder suspicious for stones. No evidence for acute
cholecystitis. No biliary dilatation.

Pancreas: Pancreas within normal limits.

Spleen: Spleen within normal limits.

Adrenals/Urinary Tract: Adrenal glands are normal. Kidneys
demonstrate symmetric enhancement. Right kidney atrophic. No
nephrolithiasis, hydronephrosis, or focal enhancing renal mass. No
hydroureter. Bladder within normal limits.

Stomach/Bowel: Stomach within normal limits. No evidence for bowel
obstruction hazy inflammatory stranding involving several colonic
diverticula within the left lower quadrant, consistent with acute
diverticulitis. No evidence for perforation or other complication.
No other acute inflammatory changes seen about the bowels.

Vascular/Lymphatic: Moderate to advanced aorto bi-iliac
atherosclerotic disease. No aneurysm. Mesenteric vessels patent
proximally. No adenopathy.

Reproductive: Enlarged prostate with median lobe hypertrophy.

Other: No free air or fluid.

Musculoskeletal: No acute osseus abnormality. No worrisome lytic or
blastic osseous lesions. Left convex lumbar scoliosis with advanced
multilevel degenerative spondylolysis.
IMPRESSION: 1. Findings consistent with acute colonic diverticulitis. No
evidence for perforation or other complication.
2. Cholelithiasis.
3. Moderate to advanced aorto bi-iliac atherosclerotic disease.

## 2019-08-27 ENCOUNTER — Other Ambulatory Visit: Payer: Self-pay

## 2019-08-27 ENCOUNTER — Encounter: Payer: Self-pay | Admitting: Internal Medicine

## 2019-08-27 ENCOUNTER — Ambulatory Visit (INDEPENDENT_AMBULATORY_CARE_PROVIDER_SITE_OTHER): Payer: Medicare Other | Admitting: Internal Medicine

## 2019-08-27 VITALS — BP 116/78 | HR 81 | Temp 97.5°F | Ht 65.75 in | Wt 213.0 lb

## 2019-08-27 DIAGNOSIS — R5383 Other fatigue: Secondary | ICD-10-CM | POA: Diagnosis not present

## 2019-08-27 DIAGNOSIS — R1031 Right lower quadrant pain: Secondary | ICD-10-CM

## 2019-08-27 LAB — CBC
HCT: 38.7 % — ABNORMAL LOW (ref 39.0–52.0)
Hemoglobin: 13.2 g/dL (ref 13.0–17.0)
MCHC: 34.2 g/dL (ref 30.0–36.0)
MCV: 87.2 fl (ref 78.0–100.0)
Platelets: 207 10*3/uL (ref 150.0–400.0)
RBC: 4.45 Mil/uL (ref 4.22–5.81)
RDW: 13.7 % (ref 11.5–15.5)
WBC: 6.8 10*3/uL (ref 4.0–10.5)

## 2019-08-27 LAB — T4, FREE: Free T4: 1.13 ng/dL (ref 0.60–1.60)

## 2019-08-27 LAB — COMPREHENSIVE METABOLIC PANEL
ALT: 14 U/L (ref 0–53)
AST: 20 U/L (ref 0–37)
Albumin: 4.3 g/dL (ref 3.5–5.2)
Alkaline Phosphatase: 47 U/L (ref 39–117)
BUN: 19 mg/dL (ref 6–23)
CO2: 29 mEq/L (ref 19–32)
Calcium: 8.9 mg/dL (ref 8.4–10.5)
Chloride: 102 mEq/L (ref 96–112)
Creatinine, Ser: 1.04 mg/dL (ref 0.40–1.50)
GFR: 69.04 mL/min (ref >60.00)
Glucose, Bld: 98 mg/dL (ref 70–99)
Potassium: 3.8 mEq/L (ref 3.5–5.1)
Sodium: 137 mEq/L (ref 135–145)
Total Bilirubin: 0.8 mg/dL (ref 0.2–1.2)
Total Protein: 6.5 g/dL (ref 6.0–8.3)

## 2019-08-27 LAB — SEDIMENTATION RATE: Sed Rate: 7 mm/hr (ref 0–20)

## 2019-08-27 MED ORDER — PRAVASTATIN SODIUM 40 MG PO TABS: 40.0000 mg | ORAL_TABLET | Freq: Every evening | ORAL | 3 refills | Status: DC

## 2019-08-27 NOTE — Progress Notes (Signed)
Subjective:    Patient ID: [name], male    DOB: 06/08/1940, 79 y.o.   MRN: [id number]  HPI Here due to fatigue This visit occurred during the SARS-CoV-2 public health emergency.  Safety protocols were in place, including screening questions prior to the visit, additional usage of staff PPE, and extensive cleaning of exam room while observing appropriate contact time as indicated for disinfecting solutions.   "I don't have any energy, I'm tired" Does go to gym to work out after breakfast---came home and slept for 2 hours Feels tired physically--not as much sedation  Having some "grinding" in right lower quadrant Slight for the past few weeks but worsened yesterday Some gas also Eating his usual Pain got severe last night in bed---tried some topical Rx with coconut oil and it seemed to help "grinding" started again this morning. Took probiotic and it seemed to go away Not related to eating No N/V  Uses tramadol regularly for the back pain--mostly just once a day Better since the PT Awakens with pain--just uses the AM tramadol Not taking the diclofenac lately  Current Outpatient Medications on File Prior to Visit  Medication Sig Dispense Refill  . acetaminophen (TYLENOL) 500 MG tablet Take 500 mg by mouth every 8 (eight) hours as needed (for pain).     Marland Kitchen alfuzosin (UROXATRAL) 10 MG 24 hr tablet Take 1 tablet (10 mg total) by mouth daily. 90 tablet 3  . ALPRAZolam (XANAX) 0.25 MG tablet Take 1 tablet (0.25 mg total) by mouth 2 (two) times daily as needed for anxiety. 20 tablet 0  . aspirin 81 MG tablet Take 81 mg by mouth daily.      . diclofenac (VOLTAREN) 75 MG EC tablet Take 1 tablet (75 mg total) by mouth 2 (two) times daily. 180 tablet 3  . doxazosin (CARDURA) 2 MG tablet Take 2 mg by mouth daily.    Marland Kitchen esomeprazole (NEXIUM) 40 MG capsule Take 1 capsule (40 mg total) by mouth 2 (two) times daily before a meal. 60 capsule 2  . finasteride (PROSCAR) 5 MG tablet TAKE 1  TABLET BY MOUTH DAILY 90 tablet 3  . traMADol (ULTRAM) 50 MG tablet TAKE 1 TABLET (50 MG TOTAL) BY MOUTH 3 (THREE) TIMES DAILY AS NEEDED. 90 tablet 0  . fluticasone (FLONASE) 50 MCG/ACT nasal spray Place 1 spray into both nostrils daily. 16 g 1  . pravastatin (PRAVACHOL) 40 MG tablet Take 1 tablet (40 mg total) by mouth every evening. 90 tablet 3   No current facility-administered medications on file prior to visit.    Allergies  Allergen Reactions  . Anesthetics, Amide Nausea Only    Other reaction(s): Vomiting After surgery patient has problems with nausea and vomiting due to anesthetics  . Atorvastatin     REACTION: myalgias  . Oxycodone Nausea And Vomiting  . Simvastatin     REACTION: joint and stomach pain  . Latex Rash    Local area  . Statins Other (See Comments)    Joint pain    Past Medical History:  Diagnosis Date  . Allergy   . Anxiety   . Aortic stenosis   . Barrett's esophagus   . BPH (benign prostatic hypertrophy)   . Diaphragm dysfunction    right frozen diaphragm  . Diverticulitis   . GERD (gastroesophageal reflux disease)   . Hiatal hernia   . Hx of adenomatous colonic polyps   . Hyperlipidemia   . Kidney stones   . Obstructive  sleep apnea    CPAP  . Osteoarthritis   . Osteoporosis   . Sleep apnea    cpap nightly  . Spinal stenosis     Past Surgical History:  Procedure Laterality Date  . ESOPHAGOGASTRODUODENOSCOPY     colon--6/04, Barrett's--7/05, Barrett's but no cancer--4/08  . EYE MUSCLE SURGERY     L eye muscle surgery--2/01  . JOINT REPLACEMENT  1/12   left TKR  . KNEE ARTHROSCOPY     7/ 09 Arthroscopy left knee--Dr Noemi Chapel  . LIPOMA EXCISION Left 2010   compressing nerve by spine---[facility] Terald Sleeper at [facility]  . ROTATOR CUFF REPAIR     R rotator cuff repair--1/00  Noemi Chapel)  . ROTATOR CUFF REPAIR Left ~2005 & 11/15   biceps repair also in 2015  . TENDON REPAIR     Left knee tendon repair--1966  . TOTAL KNEE ARTHROPLASTY Right 4/16  .  VASECTOMY     Vasectomy/spermatocele/?testicular cyst--1970    Family History  Problem Relation Age of Onset  . Hypertension Mother   . Breast cancer Mother   . Colon polyps Mother   . Coronary artery disease Mother   . Diabetes Neg Hx   . Colon cancer Neg Hx   . Esophageal cancer Neg Hx   . Gallbladder disease Neg Hx   . Kidney disease Neg Hx     Social History   Socioeconomic History  . Marital status: Married    Spouse name: Not on file  . Number of children: 3  . Years of education: Not on file  . Highest education level: Not on file  Occupational History  . Occupation: retired    Fish farm manager: RETIRED  Tobacco Use  . Smoking status: Never Smoker  . Smokeless tobacco: Never Used  Substance and Sexual Activity  . Alcohol use: Yes    Alcohol/week: 0.0 standard drinks    Comment: occassional wine  . Drug use: No  . Sexual activity: Not on file  Other Topics Concern  . Not on file  Social History Narrative   Lives at home with wife      Has living will   Wife is health care POA---alternate daughter [name]   Would accept resuscitation attempts   No tube feeds if cognitively unaware         Social Determinants of Health   Financial Resource Strain:   . Difficulty of Paying Living Expenses:   Food Insecurity:   . Worried About Charity fundraiser in the Last Year:   . Arboriculturist in the Last Year:   Transportation Needs:   . Film/video editor (Medical):   Marland Kitchen Lack of Transportation (Non-Medical):   Physical Activity:   . Days of Exercise per Week:   . Minutes of Exercise per Session:   Stress:   . Feeling of Stress :   Social Connections:   . Frequency of Communication with Friends and Family:   . Frequency of Social Gatherings with Friends and Family:   . Attends Religious Services:   . Active Member of Clubs or Organizations:   . Attends Archivist Meetings:   Marland Kitchen Marital Status:   Intimate Partner Violence:   . Fear of Current or  Ex-Partner:   . Emotionally Abused:   Marland Kitchen Physically Abused:   . Sexually Abused:    Review of Systems Bowels are moving fine Eats sherbet ice cream--not new Has EGD and colon scheduled soon Goes to sleep 9:30-10. Up twice for  nocturia and doesn't sleep well after 3 AM. Awakens refreshed but then exhausted after gym    Objective:   Physical Exam  Constitutional: He appears well-developed. No distress.  HENT:  No oral lesions  Neck: No thyromegaly present.  Cardiovascular: Normal rate and regular rhythm. Exam reveals no gallop.  Very faint aortic systolic murmur  Respiratory: Effort normal and breath sounds normal. No respiratory distress. He has no wheezes. He has no rales.  GI: Soft. Bowel sounds are normal. He exhibits no distension. There is no abdominal tenderness. There is no rebound and no guarding.  No tenderness even to deep palpation  Musculoskeletal:        General: No edema.  Lymphadenopathy:    He has no cervical adenopathy.           Assessment & Plan:

## 2019-08-27 NOTE — Assessment & Plan Note (Signed)
Had grinding sensation in epigastrium--but now in RLQ No tenderness Likely gas---discussed low FODMAP (info given) Has EGD and colon next month May need to consider CT scan if ongoing pain

## 2019-08-27 NOTE — Assessment & Plan Note (Signed)
Gets tired after his AM gym work out Not clearly somnolence---discussed avoiding daytime naps Nothing worrisome on exam Will check labs

## 2019-09-03 ENCOUNTER — Telehealth: Payer: Self-pay | Admitting: Physician Assistant

## 2019-09-03 MED ORDER — NA SULFATE-K SULFATE-MG SULF 17.5-3.13-1.6 GM/177ML PO SOLN: 1.0000 | Freq: Once | ORAL | 0 refills | Status: AC

## 2019-09-03 NOTE — Telephone Encounter (Signed)
[facility]

## 2019-09-06 DIAGNOSIS — M1612 Unilateral primary osteoarthritis, left hip: Secondary | ICD-10-CM | POA: Diagnosis not present

## 2019-09-06 DIAGNOSIS — M25561 Pain in right knee: Secondary | ICD-10-CM | POA: Diagnosis not present

## 2019-09-06 DIAGNOSIS — M1711 Unilateral primary osteoarthritis, right knee: Secondary | ICD-10-CM | POA: Diagnosis not present

## 2019-09-06 DIAGNOSIS — G5702 Lesion of sciatic nerve, left lower limb: Secondary | ICD-10-CM | POA: Diagnosis not present

## 2019-09-06 DIAGNOSIS — Z96651 Presence of right artificial knee joint: Secondary | ICD-10-CM | POA: Diagnosis not present

## 2019-09-06 DIAGNOSIS — Z96652 Presence of left artificial knee joint: Secondary | ICD-10-CM | POA: Diagnosis not present

## 2019-09-11 ENCOUNTER — Other Ambulatory Visit: Payer: Self-pay | Admitting: Gastroenterology

## 2019-09-11 ENCOUNTER — Ambulatory Visit (INDEPENDENT_AMBULATORY_CARE_PROVIDER_SITE_OTHER): Payer: Medicare Other

## 2019-09-11 DIAGNOSIS — Z1159 Encounter for screening for other viral diseases: Secondary | ICD-10-CM

## 2019-09-11 LAB — SARS CORONAVIRUS 2 (TAT 6-24 HRS): SARS Coronavirus 2: NEGATIVE

## 2019-09-13 ENCOUNTER — Telehealth: Payer: Self-pay | Admitting: Gastroenterology

## 2019-09-13 ENCOUNTER — Ambulatory Visit (AMBULATORY_SURGERY_CENTER): Payer: Medicare Other | Admitting: Gastroenterology

## 2019-09-13 ENCOUNTER — Other Ambulatory Visit: Payer: Self-pay

## 2019-09-13 ENCOUNTER — Encounter: Payer: Self-pay | Admitting: Gastroenterology

## 2019-09-13 VITALS — BP 128/78 | HR 80 | Temp 97.7°F | Resp 18 | Ht 66.0 in | Wt 219.0 lb

## 2019-09-13 DIAGNOSIS — Z8719 Personal history of other diseases of the digestive system: Secondary | ICD-10-CM

## 2019-09-13 DIAGNOSIS — K449 Diaphragmatic hernia without obstruction or gangrene: Secondary | ICD-10-CM

## 2019-09-13 DIAGNOSIS — R0989 Other specified symptoms and signs involving the circulatory and respiratory systems: Secondary | ICD-10-CM

## 2019-09-13 DIAGNOSIS — K2270 Barrett's esophagus without dysplasia: Secondary | ICD-10-CM | POA: Diagnosis not present

## 2019-09-13 DIAGNOSIS — K219 Gastro-esophageal reflux disease without esophagitis: Secondary | ICD-10-CM

## 2019-09-13 DIAGNOSIS — Z1211 Encounter for screening for malignant neoplasm of colon: Secondary | ICD-10-CM | POA: Diagnosis not present

## 2019-09-13 DIAGNOSIS — Z86010 Personal history of colonic polyps: Secondary | ICD-10-CM

## 2019-09-13 MED ORDER — SODIUM CHLORIDE 0.9 % IV SOLN: 500.0000 mL | Freq: Once | INTRAVENOUS | Status: DC

## 2019-09-13 NOTE — Op Note (Signed)
[facility] Patient Name: [name] Procedure Date: 09/13/2019 10:54 AM MRN: [id number] Endoscopist: Milus Banister , MD Age: 79 Referring MD:  Date of Birth: Nov 22, 1940 Gender: Male Account #: [id number] Procedure:                Upper GI endoscopy Indications:              Globus, right side of neck. No dysphagia. H/o                            Barrett's without dysplasia at irregular z line 2016 Medicines:                Monitored Anesthesia Care Procedure:                Pre-Anesthesia Assessment:                           - Prior to the procedure, a History and Physical                            was performed, and patient medications and                            allergies were reviewed. The patient's tolerance of                            previous anesthesia was also reviewed. The risks                            and benefits of the procedure and the sedation                            options and risks were discussed with the patient.                            All questions were answered, and informed consent                            was obtained. Prior Anticoagulants: The patient has                            taken no previous anticoagulant or antiplatelet                            agents. ASA Grade Assessment: II - A patient with                            mild systemic disease. After reviewing the risks                            and benefits, the patient was deemed in                            satisfactory condition to undergo the procedure.  After obtaining informed consent, the endoscope was                            passed under direct vision. Throughout the                            procedure, the patient's blood pressure, pulse, and                            oxygen saturations were monitored continuously. The                            Endoscope was introduced through the mouth, and                            advanced  to the second part of duodenum. The upper                            GI endoscopy was accomplished without difficulty.                            The patient tolerated the procedure well. Scope In: Scope Out: Findings:                 Slightly irregular Z line without nodularity. No                            obvious Barrett's appearing mucosa.                           A small hiatal hernia was present.                           The exam was otherwise without abnormality. Complications:            No immediate complications. Estimated blood loss:                            None. Estimated Blood Loss:     Estimated blood loss: none. Impression:               - Small hiatal hernia.                           - Slightly irregular Z line without nodularity.                           - No specimens collected. Recommendation:           - Patient has a contact number available for                            emergencies. The signs and symptoms of potential                            delayed complications were discussed with the  patient. Return to normal activities tomorrow.                            Written discharge instructions were provided to the                            patient.                           - Resume previous diet.                           - Continue present medications. Milus Banister, MD 09/13/2019 11:23:32 AM This report has been signed electronically.

## 2019-09-13 NOTE — Progress Notes (Signed)
[facility] notified of information concerning frozen diaphragm.

## 2019-09-13 NOTE — Progress Notes (Signed)
Patient states has a frozen diaphram.

## 2019-09-13 NOTE — Patient Instructions (Signed)
HANDOUT ON DIVERTICULOSIS GIVEN TO YOU TODAY   YOU HAD AN ENDOSCOPIC PROCEDURE TODAY AT [facility]:   Refer to the procedure report that was given to you for any specific questions about what was found during the examination.  If the procedure report does not answer your questions, please call your gastroenterologist to clarify.  If you requested that your care partner not be given the details of your procedure findings, then the procedure report has been included in a sealed envelope for you to review at your convenience later.  YOU SHOULD EXPECT: Some feelings of bloating in the abdomen. Passage of more gas than usual.  Walking can help get rid of the air that was put into your GI tract during the procedure and reduce the bloating. If you had a lower endoscopy (such as a colonoscopy or flexible sigmoidoscopy) you may notice spotting of blood in your stool or on the toilet paper. If you underwent a bowel prep for your procedure, you may not have a normal bowel movement for a few days.  Please Note:  You might notice some irritation and congestion in your nose or some drainage.  This is from the oxygen used during your procedure.  There is no need for concern and it should clear up in a day or so.  SYMPTOMS TO REPORT IMMEDIATELY:   Following lower endoscopy (colonoscopy or flexible sigmoidoscopy):  Excessive amounts of blood in the stool  Significant tenderness or worsening of abdominal pains  Swelling of the abdomen that is new, acute  Fever of 100F or higher   Following upper endoscopy (EGD)  Vomiting of blood or coffee ground material  New chest pain or pain under the shoulder blades  Painful or persistently difficult swallowing  New shortness of breath  Fever of 100F or higher  Black, tarry-looking stools  For urgent or emergent issues, a gastroenterologist can be reached at any hour by calling [phone]. Do not use MyChart messaging for urgent concerns.     DIET:  We do recommend a small meal at first, but then you may proceed to your regular diet.  Drink plenty of fluids but you should avoid alcoholic beverages for 24 hours.  ACTIVITY:  You should plan to take it easy for the rest of today and you should NOT DRIVE or use heavy machinery until tomorrow (because of the sedation medicines used during the test).    FOLLOW UP: Our staff will call the number listed on your records 48-72 hours following your procedure to check on you and address any questions or concerns that you may have regarding the information given to you following your procedure. If we do not reach you, we will leave a message.  We will attempt to reach you two times.  During this call, we will ask if you have developed any symptoms of COVID 19. If you develop any symptoms (ie: fever, flu-like symptoms, shortness of breath, cough etc.) before then, please call [phone].  If you test positive for Covid 19 in the 2 weeks post procedure, please call and report this information to [place].    If any biopsies were taken you will be contacted by phone or by letter within the next 1-3 weeks.  Please call us at [phone] if you have not heard about the biopsies in 3 weeks.    SIGNATURES/CONFIDENTIALITY: You and/or your care partner have signed paperwork which will be entered into your electronic medical record.  These signatures  attest to the fact that that the information above on your After Visit Summary has been reviewed and is understood.  Full responsibility of the confidentiality of this discharge information lies with you and/or your care-partner.

## 2019-09-13 NOTE — Progress Notes (Signed)
Pt's states no medical or surgical changes since previsit or office visit. 

## 2019-09-13 NOTE — Op Note (Addendum)
[name] Patient Name: [name] Procedure Date: 09/13/2019 10:54 AM MRN: [id number] Endoscopist: Milus Banister , MD Age: 79 Referring MD:  Date of Birth: Mar 20, 1941 Gender: Male Account #: [id number] Procedure:                Colonoscopy Indications:              High risk colon cancer surveillance: Personal                            history of colonic polyps. Colonsocoyp 2016 single                            subCM adenoma Medicines:                Monitored Anesthesia Care Procedure:                Pre-Anesthesia Assessment:                           - Prior to the procedure, a History and Physical                            was performed, and patient medications and                            allergies were reviewed. The patient's tolerance of                            previous anesthesia was also reviewed. The risks                            and benefits of the procedure and the sedation                            options and risks were discussed with the patient.                            All questions were answered, and informed consent                            was obtained. Prior Anticoagulants: The patient has                            taken no previous anticoagulant or antiplatelet                            agents. ASA Grade Assessment: II - A patient with                            mild systemic disease. After reviewing the risks                            and benefits, the patient was deemed in  satisfactory condition to undergo the procedure.                           After obtaining informed consent, the colonoscope                            was passed under direct vision. Throughout the                            procedure, the patient's blood pressure, pulse, and                            oxygen saturations were monitored continuously. The                            Colonoscope was introduced through the anus and                            advanced to the the cecum, identified by                            appendiceal orifice and ileocecal valve. The                            colonoscopy was performed without difficulty. The                            patient tolerated the procedure well. The quality                            of the bowel preparation was good. The ileocecal                            valve, appendiceal orifice, and rectum were                            photographed. Scope In: 11:03:18 AM Scope Out: 11:14:45 AM Scope Withdrawal Time: 0 hours 10 minutes 2 seconds  Total Procedure Duration: 0 hours 11 minutes 27 seconds  Findings:                 Multiple small and large-mouthed diverticula were                            found in the left colon.                           The exam was otherwise without abnormality on                            direct and retroflexion views. Complications:            No immediate complications. Estimated blood loss:                            None. Estimated Blood Loss:  Estimated blood loss: none. Impression:               - Diverticulosis in the left colon.                           - The examination was otherwise normal on direct                            and retroflexion views.                           - No polyps or cancers. Recommendation:           - Patient has a contact number available for                            emergencies. The signs and symptoms of potential                            delayed complications were discussed with the                            patient. Return to normal activities tomorrow.                            Written discharge instructions were provided to the                            patient.                           - Resume previous diet.                           - Continue present medications.                           - You do not need any further colon cancer                            screening  tests (including stool testing). These                            types of tests generally stop around age 54-80. Milus Banister, MD 09/13/2019 11:16:52 AM This report has been signed electronically.

## 2019-09-13 NOTE — Progress Notes (Signed)
A/ox3, pleased with MAC, report to RN 

## 2019-09-13 NOTE — Telephone Encounter (Signed)
Patient scheduled for EGD/Colo today at 11:00. Called to state he vomited 15 mins after prep dose #2 (Suprep). Was having clear liquid stools overnight after 1st dose.   Given report of clear stools and plan for EGD as well, recommended keeping appt as scheduled. Appreciative of call back.

## 2019-09-14 DIAGNOSIS — M5441 Lumbago with sciatica, right side: Secondary | ICD-10-CM | POA: Diagnosis not present

## 2019-09-14 DIAGNOSIS — G8929 Other chronic pain: Secondary | ICD-10-CM | POA: Diagnosis not present

## 2019-09-14 DIAGNOSIS — M5440 Lumbago with sciatica, unspecified side: Secondary | ICD-10-CM | POA: Diagnosis not present

## 2019-09-15 ENCOUNTER — Other Ambulatory Visit: Payer: Self-pay | Admitting: Internal Medicine

## 2019-09-16 NOTE — Telephone Encounter (Signed)
Last filled 08-07-19 #90 Last OV 08-07-19 Next OV 02-07-20 [facility]

## 2019-09-17 ENCOUNTER — Telehealth: Payer: Self-pay

## 2019-09-17 NOTE — Telephone Encounter (Signed)
  Follow up Call-  Call back number 09/13/2019  Post procedure Call Back phone  # [phone]  Permission to leave phone message Yes  Some recent data might be hidden     Patient questions:  Do you have a fever, pain , or abdominal swelling? No. Pain Score  0 *  Have you tolerated food without any problems? Yes.    Have you been able to return to your normal activities? Yes.    Do you have any questions about your discharge instructions: Diet   No. Medications  No. Follow up visit  No.  Do you have questions or concerns about your Care? No.  Actions: * If pain score is 4 or above: No action needed, pain <4.  1. Have you developed a fever since your procedure? no  2.   Have you had an respiratory symptoms (SOB or cough) since your procedure? no  3.   Have you tested positive for COVID 19 since your procedure no  4.   Have you had any family members/close contacts diagnosed with the COVID 19 since your procedure?  no   If yes to any of these questions please route to Joylene John, RN and Erenest Rasher, RN

## 2019-10-09 DIAGNOSIS — G5702 Lesion of sciatic nerve, left lower limb: Secondary | ICD-10-CM | POA: Diagnosis not present

## 2019-10-10 ENCOUNTER — Other Ambulatory Visit: Payer: Self-pay | Admitting: Internal Medicine

## 2019-10-16 ENCOUNTER — Other Ambulatory Visit: Payer: Self-pay | Admitting: Internal Medicine

## 2019-10-16 NOTE — Telephone Encounter (Signed)
Last filled 03-06-19 #20 Last OV 08-30-19 Next OV 02-07-20 [facility]

## 2019-10-26 ENCOUNTER — Other Ambulatory Visit: Payer: Self-pay | Admitting: Internal Medicine

## 2019-10-28 NOTE — Telephone Encounter (Signed)
Last filled 09-16-19 #90 Last OV 08-27-19 Next OV 02-07-20 [facility]

## 2019-11-10 DIAGNOSIS — I351 Nonrheumatic aortic (valve) insufficiency: Secondary | ICD-10-CM | POA: Insufficient documentation

## 2019-11-10 NOTE — Progress Notes (Signed)
Cardiology Office Note   Date:  11/12/2019   ID:  Arthur EUCEDA, DOB February 07, 1941, MRN [id number]  PCP:  Venia Carbon, MD  Cardiologist:   No primary care provider on file. Referring:  Venia Carbon, MD  No chief complaint on file.     History of Present Illness: [name] is a 79 y.o. male who is referred by Venia Carbon, MD for evaluation of mild AS, mild/mod AI, and mildly enlarged aortic root.  He says he feels great.  He does yard work.  He walks for exercise.  He works with some disadvantaged young people.  The patient denies any new symptoms such as chest discomfort, neck or arm discomfort. There has been no new shortness of breath, PND or orthopnea. There have been no reported palpitations, presyncope or syncope.    Past Medical History:  Diagnosis Date  . Allergy   . Anxiety   . Aortic stenosis   . Barrett's esophagus   . BPH (benign prostatic hypertrophy)   . Diaphragm dysfunction    right frozen diaphragm  . Diverticulitis   . GERD (gastroesophageal reflux disease)   . Hiatal hernia   . Hx of adenomatous colonic polyps   . Hyperlipidemia   . Kidney stones   . Obstructive sleep apnea    BiPAP  . Osteoarthritis   . Osteoporosis   . Spinal stenosis     Past Surgical History:  Procedure Laterality Date  . ESOPHAGOGASTRODUODENOSCOPY     colon--6/04, Barrett's--7/05, Barrett's but no cancer--4/08  . EYE MUSCLE SURGERY     L eye muscle surgery--2/01  . JOINT REPLACEMENT  1/12   left TKR  . KNEE ARTHROSCOPY     7/ 09 Arthroscopy left knee--Dr Noemi Chapel  . LIPOMA EXCISION Left 2010   compressing nerve by spine---Dr Terald Sleeper at [facility]  . ROTATOR CUFF REPAIR     R rotator cuff repair--1/00  Noemi Chapel)  . ROTATOR CUFF REPAIR Left ~2005 & 11/15   biceps repair also in 2015  . TENDON REPAIR     Left knee tendon repair--1966  . TOTAL KNEE ARTHROPLASTY Right 4/16  . VASECTOMY     Vasectomy/spermatocele/?testicular cyst--1970     Current  Outpatient Medications  Medication Sig Dispense Refill  . acetaminophen (TYLENOL) 500 MG tablet Take 500 mg by mouth every 8 (eight) hours as needed (for pain).     Marland Kitchen alfuzosin (UROXATRAL) 10 MG 24 hr tablet Take 1 tablet (10 mg total) by mouth daily. 90 tablet 3  . ALPRAZolam (XANAX) 0.25 MG tablet TAKE 1 TABLET (0.25 MG TOTAL) BY MOUTH 2 (TWO) TIMES DAILY AS NEEDED FOR ANXIETY. 20 tablet 0  . aspirin 81 MG tablet Take 81 mg by mouth daily.      . diclofenac (VOLTAREN) 75 MG EC tablet TAKE 1 TABLET BY MOUTH TWICE A DAY 180 tablet 1  . doxazosin (CARDURA) 2 MG tablet Take 2 mg by mouth daily.    Marland Kitchen esomeprazole (NEXIUM) 40 MG capsule Take 1 capsule (40 mg total) by mouth 2 (two) times daily before a meal. 60 capsule 2  . finasteride (PROSCAR) 5 MG tablet TAKE 1 TABLET BY MOUTH DAILY 90 tablet 3  . fluticasone (FLONASE) 50 MCG/ACT nasal spray Place 1 spray into both nostrils daily. 16 g 1  . pravastatin (PRAVACHOL) 40 MG tablet Take 1 tablet (40 mg total) by mouth every evening. 90 tablet 3  . traMADol (ULTRAM) 50 MG tablet TAKE 1 TABLET (  50 MG TOTAL) BY MOUTH 3 (THREE) TIMES DAILY AS NEEDED. 90 tablet 0   No current facility-administered medications for this visit.    Allergies:   Anesthetics, amide; Atorvastatin; Oxycodone; Simvastatin; Latex; and Statins    ROS:  Please see the history of present illness.   Otherwise, review of systems are positive for none.   All other systems are reviewed and negative.    PHYSICAL EXAM: VS:  BP 138/80   Pulse 73   Ht 5\' 8"  (1.727 m)   Wt 210 lb (95.3 kg)   SpO2 96%   BMI 31.93 kg/m  , BMI Body mass index is 31.93 kg/m. GENERAL:  Well appearing NECK:  No jugular venous distention, waveform within normal limits, carotid upstroke brisk and symmetric, no bruits, no thyromegaly LUNGS:  Clear to auscultation bilaterally CHEST:  Unremarkable HEART:  PMI not displaced or sustained,S1 and S2 within normal limits, no S3, no S4, no clicks, no rubs, soft  apical brief systolic murmur not radiating, no significant diastolic murmurs ABD:  Flat, positive bowel sounds normal in frequency in pitch, no bruits, no rebound, no guarding, no midline pulsatile mass, no hepatomegaly, no splenomegaly EXT:  2 plus pulses throughout, no edema, no cyanosis no clubbing   EKG:  EKG is  ordered today. The ekg ordered today demonstrates sinus rhythm, rate 73, left axis deviation, left ventricular hypertrophy, nonspecific inferior T wave changes., LAFB.  No change from previous early transition lead V2 which could be lead placement.   Recent Labs: 08/27/2019: ALT 14; BUN 19; Creatinine, Ser 1.04; Hemoglobin 13.2; Platelets 207.0; Potassium 3.8; Sodium 137    Lipid Panel    Component Value Date/Time   CHOL 163 12/26/2017 0804   TRIG 45 12/26/2017 0804   HDL 45 12/26/2017 0804   CHOLHDL 3.6 12/26/2017 0804   CHOLHDL 6 12/28/2016 1051   VLDL 16.4 12/28/2016 1051   LDLCALC 109 (H) 12/26/2017 0804   LDLDIRECT 157.4 08/16/2012 1626      Wt Readings from Last 3 Encounters:  11/12/19 210 lb (95.3 kg)  09/13/19 219 lb (99.3 kg)  08/27/19 213 lb (96.6 kg)      Other studies Reviewed: Additional studies/ records that were reviewed today include: Labs . Review of the above records demonstrates: See elsewhere  ASSESSMENT AND PLAN:  AI:   This was mild to moderate on echo last year.  I will follow this up probably in a couple of years with echo and by exam next year.   HTN: Blood pressures well controlled.  No change in therapies.  He says it is much lower at home than it was today  DYSLIPIDEMIA:    He has been intolerant to statins and did not want PCSK9 inhibitor.    AORTIC ROOT ENLARGEMENT:     This was 4.1 cm on CT last year.  I will check a CT in August of next year.  ABNORMAL EKG: We discussed this briefly.  He has no syncope despite the conduction disturbance.  He would let me know if that happens.  COVID EDUCATION: He is preventing Covid through  nutraceuticals.  Current medicines are reviewed at length with the patient today.  The patient does not have concerns regarding medicines.  The following changes have been made:  None  Labs/ tests ordered today include:  None.  He was up-to-date with chemistry and CBC.  I reviewed these labs.  No orders of the defined types were placed in this encounter.  Disposition:   FU with me in 1 year.      Signed, Minus Breeding, MD  11/12/2019 1:45 PM    [facility]

## 2019-11-12 ENCOUNTER — Other Ambulatory Visit: Payer: Self-pay

## 2019-11-12 ENCOUNTER — Encounter: Payer: Self-pay | Admitting: Cardiology

## 2019-11-12 ENCOUNTER — Ambulatory Visit (INDEPENDENT_AMBULATORY_CARE_PROVIDER_SITE_OTHER): Payer: Medicare Other | Admitting: Cardiology

## 2019-11-12 VITALS — BP 138/80 | HR 73 | Ht 68.0 in | Wt 210.0 lb

## 2019-11-12 DIAGNOSIS — Z7189 Other specified counseling: Secondary | ICD-10-CM | POA: Diagnosis not present

## 2019-11-12 DIAGNOSIS — I712 Thoracic aortic aneurysm, without rupture, unspecified: Secondary | ICD-10-CM

## 2019-11-12 DIAGNOSIS — I7121 Aneurysm of the ascending aorta, without rupture: Secondary | ICD-10-CM

## 2019-11-12 DIAGNOSIS — I350 Nonrheumatic aortic (valve) stenosis: Secondary | ICD-10-CM | POA: Diagnosis not present

## 2019-11-12 DIAGNOSIS — Z01812 Encounter for preprocedural laboratory examination: Secondary | ICD-10-CM

## 2019-11-12 DIAGNOSIS — I351 Nonrheumatic aortic (valve) insufficiency: Secondary | ICD-10-CM

## 2019-11-12 NOTE — Patient Instructions (Signed)
Medication Instructions:  Your physician recommends that you continue on your current medications as directed. Please refer to the Current Medication list given to you today.  *If you need a refill on your cardiac medications before your next appointment, please call your pharmacy*   Lab Work: BMET - to be completed prior to CT test in August 2022  If you have labs (blood work) drawn today and your tests are completely normal, you will receive your results only by: Marland Kitchen MyChart Message (if you have MyChart) OR . A paper copy in the mail If you have any lab test that is abnormal or we need to change your treatment, we will call you to review the results.   Testing/Procedures: CT Angiogram of Chest/Aorta due August 2022 You will be contacted to schedule this at [place]: At [facility], you and your health needs are our priority.  As part of our continuing mission to provide you with exceptional heart care, we have created designated Provider Care Teams.  These Care Teams include your primary Cardiologist (physician) and Advanced Practice Providers (APPs -  Physician Assistants and Nurse Practitioners) who all work together to provide you with the care you need, when you need it.  We recommend signing up for the patient portal called "MyChart".  Sign up information is provided on this After Visit Summary.  MyChart is used to connect with patients for Virtual Visits (Telemedicine).  Patients are able to view lab/test results, encounter notes, upcoming appointments, etc.  Non-urgent messages can be sent to your provider as well.   To learn more about what you can do with MyChart, go to [email or web address].    Your next appointment:   August 2022  The format for your next appointment:   In Person  Provider:   You may see Dr. Percival Spanish or one of the following Advanced Practice Providers on your designated Care Team:    Rosaria Ferries, PA-C  Jory Sims,  DNP, ANP  Cadence Kathlen Mody, NP    Other Instructions

## 2019-11-14 ENCOUNTER — Telehealth: Payer: Self-pay | Admitting: Cardiology

## 2019-11-14 NOTE — Telephone Encounter (Signed)
Will route to nurse to make her aware.  Order does not need to be placed until 12/2019.

## 2019-11-14 NOTE — Telephone Encounter (Signed)
Crystal from [facility] stated that Dr. Percival Spanish put in an order this month for a CTA Angio Chest and Aorta to be done in 12/2020. Crystal stated that the order only lasts for 1 year, so the order will expire before scan needs to be done, and stated the order doesn't need to be put in til 12/2019. Please advise.

## 2019-11-16 DIAGNOSIS — Z5321 Procedure and treatment not carried out due to patient leaving prior to being seen by health care provider: Secondary | ICD-10-CM | POA: Diagnosis not present

## 2019-11-16 DIAGNOSIS — L299 Pruritus, unspecified: Secondary | ICD-10-CM | POA: Insufficient documentation

## 2019-11-17 ENCOUNTER — Other Ambulatory Visit: Payer: Self-pay

## 2019-11-17 ENCOUNTER — Encounter: Payer: Self-pay | Admitting: Emergency Medicine

## 2019-11-17 ENCOUNTER — Emergency Department
Admission: EM | Admit: 2019-11-17 | Discharge: 2019-11-17 | Disposition: A | Discharge status: Home / Self Care | Payer: Medicare Other | Attending: Emergency Medicine | Admitting: Emergency Medicine

## 2019-11-17 DIAGNOSIS — L255 Unspecified contact dermatitis due to plants, except food: Secondary | ICD-10-CM | POA: Diagnosis not present

## 2019-11-17 NOTE — ED Triage Notes (Signed)
Pt arrived via POV with reports of exposure to poison oak, 2 days ago, pt states he picked up limbs in his driveway and noticed sap running down and noticed it was poison oak leaves. Pt c/o itching to hands and arms and lower extremities.  Pt using calamine lotion but no relief.

## 2019-11-18 ENCOUNTER — Other Ambulatory Visit: Payer: Self-pay

## 2019-11-18 MED ORDER — FINASTERIDE 5 MG PO TABS: 5.0000 mg | ORAL_TABLET | Freq: Every day | ORAL | 3 refills | Status: AC

## 2019-11-18 NOTE — Telephone Encounter (Signed)
Patient contacted the office stating that she needs a refill on Finasteride  Last refilled 02/08/19 for #990 with 3 refills. Is this ok to refill?

## 2019-12-06 NOTE — Telephone Encounter (Signed)
Order has been place

## 2019-12-11 ENCOUNTER — Ambulatory Visit (INDEPENDENT_AMBULATORY_CARE_PROVIDER_SITE_OTHER): Payer: Medicare Other | Admitting: Internal Medicine

## 2019-12-11 ENCOUNTER — Encounter: Payer: Self-pay | Admitting: Internal Medicine

## 2019-12-11 ENCOUNTER — Other Ambulatory Visit: Payer: Self-pay

## 2019-12-11 DIAGNOSIS — M25471 Effusion, right ankle: Secondary | ICD-10-CM | POA: Diagnosis not present

## 2019-12-11 MED ORDER — ALPRAZOLAM 0.25 MG PO TABS: 0.2500 mg | ORAL_TABLET | Freq: Two times a day (BID) | ORAL | 0 refills | Status: DC | PRN

## 2019-12-11 NOTE — Assessment & Plan Note (Signed)
Nothing to suggest heart, lung or kidney disease Labs normal in April No findings now to suggest DVT or injury  Reassured Discussed elevation and support socks for prolonged times when feet are down Avoid salt

## 2019-12-11 NOTE — Progress Notes (Signed)
Subjective:    Patient ID: [name], male    DOB: [birth year], 79 y.o.   MRN: [id number]  HPI Here due to right ankle swelling This visit occurred during the SARS-CoV-2 public health emergency.  Safety protocols were in place, including screening questions prior to the visit, additional usage of staff PPE, and extensive cleaning of exam room while observing appropriate contact time as indicated for disinfecting solutions.   Has had sudden swelling in right ankle Mostly in the evening Started about 3 weeks ago--and not every day Stays active---exercises, yard work, etc No pain No swelling/pain in calf  Mostly came in due to wife's concern about possible kidney disease  Current Outpatient Medications on File Prior to Visit  Medication Sig Dispense Refill  . acetaminophen (TYLENOL) 500 MG tablet Take 500 mg by mouth every 8 (eight) hours as needed (for pain).     Marland Kitchen alfuzosin (UROXATRAL) 10 MG 24 hr tablet Take 1 tablet (10 mg total) by mouth daily. 90 tablet 3  . ALPRAZolam (XANAX) 0.25 MG tablet TAKE 1 TABLET (0.25 MG TOTAL) BY MOUTH 2 (TWO) TIMES DAILY AS NEEDED FOR ANXIETY. 20 tablet 0  . aspirin 81 MG tablet Take 81 mg by mouth daily.      . diclofenac (VOLTAREN) 75 MG EC tablet TAKE 1 TABLET BY MOUTH TWICE A DAY 180 tablet 1  . doxazosin (CARDURA) 2 MG tablet Take 2 mg by mouth daily.    Marland Kitchen esomeprazole (NEXIUM) 40 MG capsule Take 1 capsule (40 mg total) by mouth 2 (two) times daily before a meal. 60 capsule 2  . finasteride (PROSCAR) 5 MG tablet Take 1 tablet (5 mg total) by mouth daily. 90 tablet 3  . traMADol (ULTRAM) 50 MG tablet TAKE 1 TABLET (50 MG TOTAL) BY MOUTH 3 (THREE) TIMES DAILY AS NEEDED. 90 tablet 0  . fluticasone (FLONASE) 50 MCG/ACT nasal spray Place 1 spray into both nostrils daily. 16 g 1  . pravastatin (PRAVACHOL) 40 MG tablet Take 1 tablet (40 mg total) by mouth every evening. 90 tablet 3   No current facility-administered medications on file prior to  visit.    Allergies  Allergen Reactions  . Anesthetics, Amide Nausea Only    Other reaction(s): Vomiting After surgery patient has problems with nausea and vomiting due to anesthetics  . Other Nausea Only and Nausea And Vomiting    After surgery patient has problems with nausea and vomiting due to anesthetics  . Atorvastatin     REACTION: myalgias  . Oxycodone Nausea And Vomiting  . Simvastatin     REACTION: joint and stomach pain  . Latex Rash    Local area  . Statins Other (See Comments)    Joint pain    Past Medical History:  Diagnosis Date  . Allergy   . Anxiety   . Aortic stenosis   . Barrett's esophagus   . BPH (benign prostatic hypertrophy)   . Diaphragm dysfunction    right frozen diaphragm  . Diverticulitis   . GERD (gastroesophageal reflux disease)   . Hiatal hernia   . Hx of adenomatous colonic polyps   . Hyperlipidemia   . Kidney stones   . Obstructive sleep apnea    BiPAP  . Osteoarthritis   . Osteoporosis   . Spinal stenosis     Past Surgical History:  Procedure Laterality Date  . ESOPHAGOGASTRODUODENOSCOPY     colon--6/04, Barrett's--7/05, Barrett's but no cancer--4/08  . EYE MUSCLE SURGERY  L eye muscle surgery--2/01  . JOINT REPLACEMENT  1/12   left TKR  . KNEE ARTHROSCOPY     7/ 09 Arthroscopy left knee--Dr Noemi Chapel  . LIPOMA EXCISION Left 2010   compressing nerve by spine---[facility] at [facility]  . ROTATOR CUFF REPAIR     R rotator cuff repair--1/00  Noemi Chapel)  . ROTATOR CUFF REPAIR Left ~2005 & 11/15   biceps repair also in 2015  . TENDON REPAIR     Left knee tendon repair--1966  . TOTAL KNEE ARTHROPLASTY Right 4/16  . VASECTOMY     Vasectomy/spermatocele/?testicular cyst--1970    Family History  Problem Relation Age of Onset  . Hypertension Mother   . Breast cancer Mother   . Colon polyps Mother   . Coronary artery disease Mother   . Diabetes Neg Hx   . Colon cancer Neg Hx   . Esophageal cancer Neg Hx   . Gallbladder disease  Neg Hx   . Kidney disease Neg Hx     Social History   Socioeconomic History  . Marital status: Married    Spouse name: Not on file  . Number of children: 3  . Years of education: Not on file  . Highest education level: Not on file  Occupational History  . Occupation: retired    Fish farm manager: RETIRED  Tobacco Use  . Smoking status: Never Smoker  . Smokeless tobacco: Never Used  Substance and Sexual Activity  . Alcohol use: Yes    Alcohol/week: 0.0 standard drinks    Comment: occassional wine  . Drug use: No  . Sexual activity: Not on file  Other Topics Concern  . Not on file  Social History Narrative   Lives at home with wife      Has living will   Wife is health care POA---alternate daughter [name]   Would accept resuscitation attempts   No tube feeds if cognitively unaware         Social Determinants of Health   Financial Resource Strain:   . Difficulty of Paying Living Expenses:   Food Insecurity:   . Worried About Charity fundraiser in the Last Year:   . Arboriculturist in the Last Year:   Transportation Needs:   . Film/video editor (Medical):   Marland Kitchen Lack of Transportation (Non-Medical):   Physical Activity:   . Days of Exercise per Week:   . Minutes of Exercise per Session:   Stress:   . Feeling of Stress :   Social Connections:   . Frequency of Communication with Friends and Family:   . Frequency of Social Gatherings with Friends and Family:   . Attends Religious Services:   . Active Member of Clubs or Organizations:   . Attends Archivist Meetings:   Marland Kitchen Marital Status:   Intimate Partner Violence:   . Fear of Current or Ex-Partner:   . Emotionally Abused:   Marland Kitchen Physically Abused:   . Sexually Abused:    Review of Systems No illness or injury Tends to keep feet down when sitting---crosses legs frequently Does get tired--- naps for an hour (after 3PM dinner). Feels refreshed Sleeps 7 hours at night No change in chronic DOE---or ?slightly  worse (especially with bending over) Rarely uses salt    Objective:   Physical Exam Constitutional:      Appearance: Normal appearance.  Cardiovascular:     Rate and Rhythm: Normal rate and regular rhythm.     Pulses: Normal pulses.  Heart sounds: No murmur heard.  No gallop.   Pulmonary:     Effort: Pulmonary effort is normal.     Breath sounds: Normal breath sounds. No wheezing or rales.  Musculoskeletal:     Cervical back: Neck supple.     Right lower leg: No edema.     Left lower leg: No edema.     Comments: No calf swelling or tenderness  Lymphadenopathy:     Cervical: No cervical adenopathy.  Skin:    Comments: No leg rash  Neurological:     Mental Status: He is alert.            Assessment & Plan:

## 2019-12-22 ENCOUNTER — Other Ambulatory Visit: Payer: Self-pay | Admitting: Internal Medicine

## 2019-12-23 NOTE — Telephone Encounter (Signed)
Last written 10-28-19 #90 Last OV 12-11-19 Next OV 02-07-20 [facility]

## 2019-12-30 DIAGNOSIS — H903 Sensorineural hearing loss, bilateral: Secondary | ICD-10-CM | POA: Diagnosis not present

## 2019-12-30 DIAGNOSIS — J392 Other diseases of pharynx: Secondary | ICD-10-CM | POA: Diagnosis not present

## 2019-12-30 DIAGNOSIS — H6123 Impacted cerumen, bilateral: Secondary | ICD-10-CM | POA: Diagnosis not present

## 2020-01-15 ENCOUNTER — Other Ambulatory Visit: Payer: Medicare Other

## 2020-01-15 ENCOUNTER — Other Ambulatory Visit: Payer: Self-pay

## 2020-01-15 DIAGNOSIS — Z20822 Contact with and (suspected) exposure to covid-19: Secondary | ICD-10-CM

## 2020-01-17 ENCOUNTER — Telehealth: Payer: Self-pay | Admitting: Internal Medicine

## 2020-01-17 ENCOUNTER — Encounter: Payer: Self-pay | Admitting: Internal Medicine

## 2020-01-17 ENCOUNTER — Other Ambulatory Visit: Payer: Self-pay | Admitting: Physician Assistant

## 2020-01-17 ENCOUNTER — Telehealth (INDEPENDENT_AMBULATORY_CARE_PROVIDER_SITE_OTHER): Payer: Medicare Other | Admitting: Internal Medicine

## 2020-01-17 ENCOUNTER — Other Ambulatory Visit: Payer: Self-pay

## 2020-01-17 ENCOUNTER — Telehealth: Payer: Self-pay | Admitting: Physician Assistant

## 2020-01-17 DIAGNOSIS — I350 Nonrheumatic aortic (valve) stenosis: Secondary | ICD-10-CM

## 2020-01-17 DIAGNOSIS — E669 Obesity, unspecified: Secondary | ICD-10-CM

## 2020-01-17 DIAGNOSIS — U071 COVID-19: Secondary | ICD-10-CM

## 2020-01-17 DIAGNOSIS — B9789 Other viral agents as the cause of diseases classified elsewhere: Secondary | ICD-10-CM

## 2020-01-17 DIAGNOSIS — J988 Other specified respiratory disorders: Secondary | ICD-10-CM

## 2020-01-17 DIAGNOSIS — I10 Essential (primary) hypertension: Secondary | ICD-10-CM

## 2020-01-17 DIAGNOSIS — I700 Atherosclerosis of aorta: Secondary | ICD-10-CM

## 2020-01-17 LAB — SARS-COV-2, NAA 2 DAY TAT

## 2020-01-17 LAB — NOVEL CORONAVIRUS, NAA: SARS-CoV-2, NAA: DETECTED — AB

## 2020-01-17 NOTE — Telephone Encounter (Signed)
Pt called back stated he received his covid results  And it was positive.    He stated dr Silvio Pate mention him getting the infusion.  He would like to go today anywhere he can get in.  Please advise  He stated that his spouse Eustaquio Maize Mcnair 05/18/43  His caregiver has symptoms also  They are going to try to find a place to get her tested today.  And he wanted to get her in also for the infusion.

## 2020-01-17 NOTE — Telephone Encounter (Signed)
LVM on Monoclonal Antibody Infusion line that PCP is referring pt for infusion. Pt has been advised by PCP during VV this morning that he would be referred.

## 2020-01-17 NOTE — Telephone Encounter (Signed)
Called to discuss with patient about Covid symptoms and the use of casirivimab/imdevimab, a monoclonal antibody infusion for those with mild to moderate Covid symptoms and at a high risk of hospitalization.  Pt is qualified for this infusion at the [facility] due to; Specific high risk criteria : Older age (>/= 78 yo), BMI>25, HTN, CVD   Message left to call back our hotline [phone]. Mychart message also sent.   [name] Form PA-C  [facility]

## 2020-01-17 NOTE — Progress Notes (Signed)
Subjective:    Patient ID: [name], male    DOB: 03/28/1941, 79 y.o.   MRN: [id number]  HPI Video virtual visit due to respiratory illness Identification done Reviewed limitations and billing and he gave consent Participants---patient in his home and I am in my office  Got sick 4 days ago--fever to 101.2, coughing, sinus congestion  Noted it after mowing lawn--so thought it could be allergies Then more clearly sick Some earache, trouble even lying down due to SOB (got nervous and needed anxiety pill)  Not quite as sick feeling---but is coughing some stuff up (with drainage) Recurrent sinus infections No SOB now---just feels weak  Did have COVID testing via [facility]--- 2 days ago  He got ivermectin and took it---told him that has not been proven to help  Current Outpatient Medications on File Prior to Visit  Medication Sig Dispense Refill   acetaminophen (TYLENOL) 500 MG tablet Take 500 mg by mouth every 8 (eight) hours as needed (for pain).      alfuzosin (UROXATRAL) 10 MG 24 hr tablet Take 1 tablet (10 mg total) by mouth daily. 90 tablet 3   ALPRAZolam (XANAX) 0.25 MG tablet Take 1 tablet (0.25 mg total) by mouth 2 (two) times daily as needed for anxiety. 20 tablet 0   aspirin 81 MG tablet Take 81 mg by mouth daily.       diclofenac (VOLTAREN) 75 MG EC tablet TAKE 1 TABLET BY MOUTH TWICE A DAY 180 tablet 1   doxazosin (CARDURA) 2 MG tablet Take 2 mg by mouth daily.     esomeprazole (NEXIUM) 40 MG capsule Take 1 capsule (40 mg total) by mouth 2 (two) times daily before a meal. 60 capsule 2   finasteride (PROSCAR) 5 MG tablet Take 1 tablet (5 mg total) by mouth daily. 90 tablet 3   traMADol (ULTRAM) 50 MG tablet TAKE 1 TABLET (50 MG TOTAL) BY MOUTH 3 (THREE) TIMES DAILY AS NEEDED. 90 tablet 0   fluticasone (FLONASE) 50 MCG/ACT nasal spray Place 1 spray into both nostrils daily. 16 g 1   pravastatin (PRAVACHOL) 40 MG tablet Take 1 tablet (40 mg total) by mouth every  evening. 90 tablet 3   No current facility-administered medications on file prior to visit.    Allergies  Allergen Reactions   Anesthetics, Amide Nausea Only    Other reaction(s): Vomiting After surgery patient has problems with nausea and vomiting due to anesthetics   Other Nausea Only and Nausea And Vomiting    After surgery patient has problems with nausea and vomiting due to anesthetics   Atorvastatin     REACTION: myalgias   Oxycodone Nausea And Vomiting   Simvastatin     REACTION: joint and stomach pain   Latex Rash    Local area   Statins Other (See Comments)    Joint pain    Past Medical History:  Diagnosis Date   Allergy    Anxiety    Aortic stenosis    Barrett's esophagus    BPH (benign prostatic hypertrophy)    Diaphragm dysfunction    right frozen diaphragm   Diverticulitis    GERD (gastroesophageal reflux disease)    Hiatal hernia    Hx of adenomatous colonic polyps    Hyperlipidemia    Kidney stones    Obstructive sleep apnea    BiPAP   Osteoarthritis    Osteoporosis    Spinal stenosis     Past Surgical History:  Procedure Laterality  Date   ESOPHAGOGASTRODUODENOSCOPY     colon--6/04, Barrett's--7/05, Barrett's but no cancer--4/08   EYE MUSCLE SURGERY     L eye muscle surgery--2/01   JOINT REPLACEMENT  1/12   left TKR   KNEE ARTHROSCOPY     7/ 09 Arthroscopy left knee--Dr Noemi Chapel   LIPOMA EXCISION Left 2010   compressing nerve by spine---Dr Terald Sleeper at [facility]     R rotator cuff repair--1/00  Noemi Chapel)   ROTATOR CUFF REPAIR Left ~2005 & 11/15   biceps repair also in 2015   TENDON REPAIR     Left knee tendon repair--1966   TOTAL KNEE ARTHROPLASTY Right 4/16   VASECTOMY     Vasectomy/spermatocele/?testicular cyst--1970    Family History  Problem Relation Age of Onset   Hypertension Mother    Breast cancer Mother    Colon polyps Mother    Coronary artery disease Mother    Diabetes  Neg Hx    Colon cancer Neg Hx    Esophageal cancer Neg Hx    Gallbladder disease Neg Hx    Kidney disease Neg Hx     Social History   Socioeconomic History   Marital status: Married    Spouse name: Not on file   Number of children: 3   Years of education: Not on file   Highest education level: Not on file  Occupational History   Occupation: retired    Fish farm manager: RETIRED  Tobacco Use   Smoking status: Never Smoker   Smokeless tobacco: Never Used  Substance and Sexual Activity   Alcohol use: Yes    Alcohol/week: 0.0 standard drinks    Comment: occassional wine   Drug use: No   Sexual activity: Not on file  Other Topics Concern   Not on file  Social History Narrative   Lives at home with wife      Has living will   Wife is health care POA---alternate daughter [name]   Would accept resuscitation attempts   No tube feeds if cognitively unaware         Social Determinants of Health   Financial Resource Strain:    Difficulty of Paying Living Expenses: Not on file  Food Insecurity:    Worried About Charity fundraiser in the Last Year: Not on file   [employer] of Food in the Last Year: Not on file  Transportation Needs:    Lack of Transportation (Medical): Not on file   Lack of Transportation (Non-Medical): Not on file  Physical Activity:    Days of Exercise per Week: Not on file   Minutes of Exercise per Session: Not on file  Stress:    Feeling of Stress : Not on file  Social Connections:    Frequency of Communication with Friends and Family: Not on file   Frequency of Social Gatherings with Friends and Family: Not on file   Attends Religious Services: Not on file   Active Member of Clubs or Organizations: Not on file   Attends Archivist Meetings: Not on file   Marital Status: Not on file  Intimate Partner Violence:    Fear of Current or Ex-Partner: Not on file   Emotionally Abused: Not on file   Physically Abused: Not on  file   Sexually Abused: Not on file   Review of Systems Slept better in last 2 nights with BiPap Didn't take COVID vaccine No loss of taste or smell No N/V Eating okay Wife  also ill No diarrhea    Objective:   Physical Exam Constitutional:      Appearance: Normal appearance.  Pulmonary:     Effort: Pulmonary effort is normal. No respiratory distress.  Neurological:     Mental Status: He is alert.            Assessment & Plan:

## 2020-01-17 NOTE — Progress Notes (Signed)
  LVM on Monoclonal Antibody Infusion line that PCP is referring pt for infusion. Pt has been advised by PCP during VV this morning that he would be referred.

## 2020-01-17 NOTE — Progress Notes (Signed)
I connected by phone with [name] on 01/17/2020 at 8:01 PM to discuss the potential use of a new treatment for mild to moderate COVID-19 viral infection in non-hospitalized patients.  This patient is a 79 y.o. male that meets the FDA criteria for Emergency Use Authorization of COVID monoclonal antibody casirivimab/imdevimab.  Has a (+) direct SARS-CoV-2 viral test result  Has mild or moderate COVID-19   Is NOT hospitalized due to COVID-19  Is within 10 days of symptom onset  Has at least one of the high risk factor(s) for progression to severe COVID-19 and/or hospitalization as defined in EUA.  Specific high risk criteria : Older age (>/= 78 yo), BMI > 25, Cardiovascular disease or hypertension and Chronic Lung Disease   I have spoken and communicated the following to the patient or parent/caregiver regarding COVID monoclonal antibody treatment:  1. FDA has authorized the emergency use for the treatment of mild to moderate COVID-19 in adults and pediatric patients with positive results of direct SARS-CoV-2 viral testing who are 80 years of age and older weighing at least 40 kg, and who are at high risk for progressing to severe COVID-19 and/or hospitalization.  2. The significant known and potential risks and benefits of COVID monoclonal antibody, and the extent to which such potential risks and benefits are unknown.  3. Information on available alternative treatments and the risks and benefits of those alternatives, including clinical trials.  4. Patients treated with COVID monoclonal antibody should continue to self-isolate and use infection control measures (e.g., wear mask, isolate, social distance, avoid sharing personal items, clean and disinfect "high touch" surfaces, and frequent handwashing) according to CDC guidelines.   5. The patient or parent/caregiver has the option to accept or refuse COVID monoclonal antibody treatment.  After reviewing this information with the  patient, The patient agreed to proceed with receiving casirivimab\imdevimab infusion and will be provided a copy of the Fact sheet prior to receiving the infusion.  Sx onset 9/6. Set up for infusion on 9/11 @ 4pm. Directions given to [facility]. Pt is aware that insurance will be charged an infusion fee. Pt is unvaccinated.   [name] Form 01/17/2020 8:01 PM

## 2020-01-17 NOTE — Telephone Encounter (Signed)
Can you please alert the infusion clinic?

## 2020-01-17 NOTE — Assessment & Plan Note (Signed)
Almost certainly COVID at this point Awaiting the test result--should get the monoclonal antibody Discussed that this is very unlikely to be bacterial at this point---would hold off on antibiotics for now but would consider next week if not improving (augmentin) Discussed supportive care---tylenol, rest, fluids----he is eating well 911 if sig SOB,etc

## 2020-01-18 ENCOUNTER — Ambulatory Visit (HOSPITAL_COMMUNITY)
Admission: RE | Admit: 2020-01-18 | Discharge: 2020-01-18 | Disposition: A | Discharge status: Home / Self Care | Payer: Medicare Other | Source: Ambulatory Visit | Attending: Pulmonary Disease | Admitting: Pulmonary Disease

## 2020-01-18 DIAGNOSIS — I350 Nonrheumatic aortic (valve) stenosis: Secondary | ICD-10-CM

## 2020-01-18 DIAGNOSIS — Z23 Encounter for immunization: Secondary | ICD-10-CM | POA: Diagnosis not present

## 2020-01-18 DIAGNOSIS — U071 COVID-19: Secondary | ICD-10-CM | POA: Insufficient documentation

## 2020-01-18 DIAGNOSIS — I10 Essential (primary) hypertension: Secondary | ICD-10-CM

## 2020-01-18 DIAGNOSIS — I700 Atherosclerosis of aorta: Secondary | ICD-10-CM

## 2020-01-18 DIAGNOSIS — E669 Obesity, unspecified: Secondary | ICD-10-CM

## 2020-01-18 MED ORDER — DIPHENHYDRAMINE HCL 50 MG/ML IJ SOLN: 50.0000 mg | Freq: Once | INTRAMUSCULAR | Status: DC | PRN

## 2020-01-18 MED ORDER — ALBUTEROL SULFATE HFA 108 (90 BASE) MCG/ACT IN AERS: 2.0000 | INHALATION_SPRAY | Freq: Once | RESPIRATORY_TRACT | Status: DC | PRN

## 2020-01-18 MED ORDER — SODIUM CHLORIDE 0.9 % IV SOLN
1200.0000 mg | Freq: Once | INTRAVENOUS | Status: AC
  Administered 2020-01-18: 1200 mg via INTRAVENOUS
  Filled 2020-01-18: qty 10

## 2020-01-18 MED ORDER — SODIUM CHLORIDE 0.9 % IV SOLN: INTRAVENOUS | Status: DC | PRN

## 2020-01-18 MED ORDER — FAMOTIDINE IN NACL 20-0.9 MG/50ML-% IV SOLN: 20.0000 mg | Freq: Once | INTRAVENOUS | Status: DC | PRN

## 2020-01-18 MED ORDER — METHYLPREDNISOLONE SODIUM SUCC 125 MG IJ SOLR: 125.0000 mg | Freq: Once | INTRAMUSCULAR | Status: DC | PRN

## 2020-01-18 MED ORDER — EPINEPHRINE 0.3 MG/0.3ML IJ SOAJ: 0.3000 mg | Freq: Once | INTRAMUSCULAR | Status: DC | PRN

## 2020-01-18 NOTE — Progress Notes (Signed)
  Diagnosis: COVID-19  Physician: Dr. Joya Gaskins  Procedure: Covid Infusion Clinic Med: casirivimab\imdevimab infusion - Provided patient with casirivimab\imdevimab fact sheet for patients, parents and caregivers prior to infusion.  Complications: No immediate complications noted.  Discharge: Discharged home   [place] 01/18/2020

## 2020-01-18 NOTE — Discharge Instructions (Signed)

## 2020-01-18 NOTE — Telephone Encounter (Signed)
Not sure what happened He was urgently waiting to get this scheduled

## 2020-02-03 DIAGNOSIS — N401 Enlarged prostate with lower urinary tract symptoms: Secondary | ICD-10-CM | POA: Diagnosis not present

## 2020-02-03 DIAGNOSIS — R351 Nocturia: Secondary | ICD-10-CM | POA: Diagnosis not present

## 2020-02-07 ENCOUNTER — Ambulatory Visit (INDEPENDENT_AMBULATORY_CARE_PROVIDER_SITE_OTHER): Payer: Medicare Other | Admitting: Internal Medicine

## 2020-02-07 ENCOUNTER — Encounter: Payer: Self-pay | Admitting: Internal Medicine

## 2020-02-07 ENCOUNTER — Other Ambulatory Visit: Payer: Self-pay

## 2020-02-07 VITALS — BP 102/68 | HR 60 | Temp 97.4°F | Ht 66.0 in | Wt 204.0 lb

## 2020-02-07 DIAGNOSIS — M5416 Radiculopathy, lumbar region: Secondary | ICD-10-CM

## 2020-02-07 DIAGNOSIS — G4733 Obstructive sleep apnea (adult) (pediatric): Secondary | ICD-10-CM

## 2020-02-07 DIAGNOSIS — Z7189 Other specified counseling: Secondary | ICD-10-CM

## 2020-02-07 DIAGNOSIS — I700 Atherosclerosis of aorta: Secondary | ICD-10-CM | POA: Diagnosis not present

## 2020-02-07 DIAGNOSIS — I10 Essential (primary) hypertension: Secondary | ICD-10-CM | POA: Diagnosis not present

## 2020-02-07 DIAGNOSIS — Z0000 Encounter for general adult medical examination without abnormal findings: Secondary | ICD-10-CM | POA: Diagnosis not present

## 2020-02-07 DIAGNOSIS — I712 Thoracic aortic aneurysm, without rupture: Secondary | ICD-10-CM | POA: Diagnosis not present

## 2020-02-07 DIAGNOSIS — I7121 Aneurysm of the ascending aorta, without rupture: Secondary | ICD-10-CM

## 2020-02-07 DIAGNOSIS — F39 Unspecified mood [affective] disorder: Secondary | ICD-10-CM

## 2020-02-07 DIAGNOSIS — F1120 Opioid dependence, uncomplicated: Secondary | ICD-10-CM | POA: Insufficient documentation

## 2020-02-07 LAB — HEPATIC FUNCTION PANEL
ALT: 12 U/L (ref 0–53)
AST: 13 U/L (ref 0–37)
Albumin: 4 g/dL (ref 3.5–5.2)
Alkaline Phosphatase: 38 U/L — ABNORMAL LOW (ref 39–117)
Bilirubin, Direct: 0.3 mg/dL (ref 0.0–0.3)
Total Bilirubin: 1.3 mg/dL — ABNORMAL HIGH (ref 0.2–1.2)
Total Protein: 6.1 g/dL (ref 6.0–8.3)

## 2020-02-07 LAB — CBC
HCT: 35.9 % — ABNORMAL LOW (ref 39.0–52.0)
Hemoglobin: 12 g/dL — ABNORMAL LOW (ref 13.0–17.0)
MCHC: 33.6 g/dL (ref 30.0–36.0)
MCV: 87.8 fl (ref 78.0–100.0)
Platelets: 173 10*3/uL (ref 150.0–400.0)
RBC: 4.09 Mil/uL — ABNORMAL LOW (ref 4.22–5.81)
RDW: 13.6 % (ref 11.5–15.5)
WBC: 4.5 10*3/uL (ref 4.0–10.5)

## 2020-02-07 LAB — RENAL FUNCTION PANEL
Albumin: 4 g/dL (ref 3.5–5.2)
BUN: 18 mg/dL (ref 6–23)
CO2: 28 mEq/L (ref 19–32)
Calcium: 8.9 mg/dL (ref 8.4–10.5)
Chloride: 105 mEq/L (ref 96–112)
Creatinine, Ser: 0.98 mg/dL (ref 0.40–1.50)
GFR: 73.85 mL/min (ref >60.00)
Glucose, Bld: 83 mg/dL (ref 70–99)
Phosphorus: 3.7 mg/dL (ref 2.3–4.6)
Potassium: 3.8 mEq/L (ref 3.5–5.1)
Sodium: 140 mEq/L (ref 135–145)

## 2020-02-07 LAB — LIPID PANEL
Cholesterol: 141 mg/dL (ref 0–200)
HDL: 41.6 mg/dL (ref >39.00)
LDL Cholesterol: 87 mg/dL (ref 0–99)
NonHDL: 99.22
Total CHOL/HDL Ratio: 3
Triglycerides: 63 mg/dL (ref 0.0–149.0)
VLDL: 12.6 mg/dL (ref 0.0–40.0)

## 2020-02-07 MED ORDER — TRAMADOL HCL 50 MG PO TABS
50.0000 mg | ORAL_TABLET | Freq: Three times a day (TID) | ORAL | 0 refills | Status: DC | PRN
Start: 2020-02-07 — End: 2020-03-22

## 2020-02-07 NOTE — Assessment & Plan Note (Signed)
See social history 

## 2020-02-07 NOTE — Progress Notes (Signed)
Subjective:    Patient ID: [name], male    DOB: 1941/01/12, 79 y.o.   MRN: [id number]  HPI Here for Medicare wellness visit and follow up of chronic health conditions This visit occurred during the SARS-CoV-2 public health emergency.  Safety protocols were in place, including screening questions prior to the visit, additional usage of staff PPE, and extensive cleaning of exam room while observing appropriate contact time as indicated for disinfecting solutions.   Reviewed form and advanced directives Reviewed other doctors No alcohol or tobacco Exercising regularly---will be going to [employer] now (had been in [employer]) Vision is not great but doesn't need glasses. Ready for the cataracts to be removed Hearing is fair--recent evaluation by Dr Tami Ribas. Not ready for hearing aides Trying to exercise regularly Fell once on boat---when rocked by swell. No injury No depression or anhedonia Memory is fine Independent with instrumental ADLs  Feels better from COVID Doesn't want the vaccine---urged him to reconsider  Due for follow up CT angio of chest for known aortic aneurysm Also has aortic atherosclerosis No chest pain No SOB No palpitations No dizziness or syncope Occasional "stress attack"----uses 1/2 xanax only occasionally No edema  Voids okay Recent urology evaluation--doesn't really need to go Nocturia x 2 Flow is fine---feels he empties well  Uses the tramadol 2-3 times a day  For lumbar pain with radiculopathy Also uses diclofenac  Current Outpatient Medications on File Prior to Visit  Medication Sig Dispense Refill  . acetaminophen (TYLENOL) 500 MG tablet Take 500 mg by mouth every 8 (eight) hours as needed (for pain).     Marland Kitchen alfuzosin (UROXATRAL) 10 MG 24 hr tablet Take 1 tablet (10 mg total) by mouth daily. 90 tablet 3  . ALPRAZolam (XANAX) 0.25 MG tablet Take 1 tablet (0.25 mg total) by mouth 2 (two) times daily as needed for anxiety. 20 tablet 0    . aspirin 81 MG tablet Take 81 mg by mouth every other day.     . diclofenac (VOLTAREN) 75 MG EC tablet TAKE 1 TABLET BY MOUTH TWICE A DAY 180 tablet 1  . doxazosin (CARDURA) 2 MG tablet Take 2 mg by mouth daily.    Marland Kitchen esomeprazole (NEXIUM) 40 MG capsule Take 1 capsule (40 mg total) by mouth 2 (two) times daily before a meal. 60 capsule 2  . finasteride (PROSCAR) 5 MG tablet Take 1 tablet (5 mg total) by mouth daily. 90 tablet 3  . traMADol (ULTRAM) 50 MG tablet TAKE 1 TABLET (50 MG TOTAL) BY MOUTH 3 (THREE) TIMES DAILY AS NEEDED. 90 tablet 0  . fluticasone (FLONASE) 50 MCG/ACT nasal spray Place 1 spray into both nostrils daily. 16 g 1  . pravastatin (PRAVACHOL) 40 MG tablet Take 1 tablet (40 mg total) by mouth every evening. 90 tablet 3   No current facility-administered medications on file prior to visit.    Allergies  Allergen Reactions  . Anesthetics, Amide Nausea Only    Other reaction(s): Vomiting After surgery patient has problems with nausea and vomiting due to anesthetics  . Other Nausea Only and Nausea And Vomiting    After surgery patient has problems with nausea and vomiting due to anesthetics  . Atorvastatin     REACTION: myalgias  . Oxycodone Nausea And Vomiting  . Simvastatin     REACTION: joint and stomach pain  . Latex Rash    Local area  . Statins Other (See Comments)    Joint pain  Past Medical History:  Diagnosis Date  . Allergy   . Anxiety   . Aortic stenosis   . Barrett's esophagus   . BPH (benign prostatic hypertrophy)   . Diaphragm dysfunction    right frozen diaphragm  . Diverticulitis   . GERD (gastroesophageal reflux disease)   . Hiatal hernia   . Hx of adenomatous colonic polyps   . Hyperlipidemia   . Kidney stones   . Obstructive sleep apnea    BiPAP  . Osteoarthritis   . Osteoporosis   . Spinal stenosis     Past Surgical History:  Procedure Laterality Date  . ESOPHAGOGASTRODUODENOSCOPY     colon--6/04, Barrett's--7/05, Barrett's  but no cancer--4/08  . EYE MUSCLE SURGERY     L eye muscle surgery--2/01  . JOINT REPLACEMENT  1/12   left TKR  . KNEE ARTHROSCOPY     7/ 09 Arthroscopy left knee--Dr Noemi Chapel  . LIPOMA EXCISION Left 2010   compressing nerve by spine---[facility]erald [facility]  . ROTATOR CUFF REPAIR     R rotator cuff repair--1/00  Noemi Chapel)  . ROTATOR CUFF REPAIR Left ~2005 & 11/15   biceps repair also in 2015  . TENDON REPAIR     Left knee tendon repair--1966  . TOTAL KNEE ARTHROPLASTY Right 4/16  . VASECTOMY     Vasectomy/spermatocele/?testicular cyst--1970    Family History  Problem Relation Age of Onset  . Hypertension Mother   . Breast cancer Mother   . Colon polyps Mother   . Coronary artery disease Mother   . Diabetes Neg Hx   . Colon cancer Neg Hx   . Esophageal cancer Neg Hx   . Gallbladder disease Neg Hx   . Kidney disease Neg Hx     Social History   Socioeconomic History  . Marital status: Married    Spouse name: Not on file  . Number of children: 3  . Years of education: Not on file  . Highest education level: Not on file  Occupational History  . Occupation: retired    Fish farm manager: RETIRED  Tobacco Use  . Smoking status: Never Smoker  . Smokeless tobacco: Never Used  Substance and Sexual Activity  . Alcohol use: Yes    Alcohol/week: 0.0 standard drinks    Comment: occassional wine  . Drug use: No  . Sexual activity: Not on file  Other Topics Concern  . Not on file  Social History Narrative   Lives at home with wife      Has living will   Wife is health care POA---alternate daughter [name]   Would accept resuscitation attempts   No tube feeds if cognitively unaware         Social Determinants of Health   Financial Resource Strain:   . Difficulty of Paying Living Expenses: Not on file  Food Insecurity:   . Worried About Charity fundraiser in the Last Year: Not on file  . Ran Out of Food in the Last Year: Not on file  Transportation Needs:   . Lack of  Transportation (Medical): Not on file  . Lack of Transportation (Non-Medical): Not on file  Physical Activity:   . Days of Exercise per Week: Not on file  . Minutes of Exercise per Session: Not on file  Stress:   . Feeling of Stress : Not on file  Social Connections:   . Frequency of Communication with Friends and Family: Not on file  . Frequency of Social Gatherings with Friends  and Family: Not on file  . Attends Religious Services: Not on file  . Active Member of Clubs or Organizations: Not on file  . Attends Archivist Meetings: Not on file  . Marital Status: Not on file  Intimate Partner Violence:   . Fear of Current or Ex-Partner: Not on file  . Emotionally Abused: Not on file  . Physically Abused: Not on file  . Sexually Abused: Not on file   Review of Systems Appetite is coming back Has gained but now lost again--watches closely Sleeps great with the BiPap Wears seat belt Teeth okay---keeps up with dentist Keeps up with derm---has new lump on right forearm---was hair follicle No heartburn or dysphagia---on daily nexium Bowels moving fine--no blood     Objective:   Physical Exam Constitutional:      Appearance: Normal appearance.  HENT:     Mouth/Throat:     Comments: No lesions Eyes:     Conjunctiva/sclera: Conjunctivae normal.     Pupils: Pupils are equal, round, and reactive to light.  Cardiovascular:     Rate and Rhythm: Normal rate and regular rhythm.     Pulses: Normal pulses.     Heart sounds: No murmur heard.  No gallop.   Pulmonary:     Effort: Pulmonary effort is normal.     Breath sounds: Normal breath sounds. No wheezing or rales.  Abdominal:     Palpations: Abdomen is soft.     Tenderness: There is no abdominal tenderness.  Musculoskeletal:        General: No swelling.     Cervical back: Neck supple.     Right lower leg: No edema.     Left lower leg: No edema.  Lymphadenopathy:     Cervical: No cervical adenopathy.  Neurological:      General: No focal deficit present.     Mental Status: He is alert and oriented to person, place, and time.     Comments: President---"Biden, Trump, OBama" [id number] [id number]-l-[id number] Recall 3/3  Psychiatric:        Mood and Affect: Mood normal.        Behavior: Behavior normal.            Assessment & Plan:

## 2020-02-07 NOTE — Assessment & Plan Note (Signed)
Uses the tramadol regularly and doing okay Also on diclofenac

## 2020-02-07 NOTE — Assessment & Plan Note (Signed)
Seen on imaging Is on pravastatin, ASA

## 2020-02-07 NOTE — Progress Notes (Signed)
Hearing Screening   125Hz  250Hz  500Hz  1000Hz  2000Hz  3000Hz  4000Hz  6000Hz  8000Hz   Right ear:           Left ear:           Comments: August 2021  Vision Screening Comments: January 2021

## 2020-02-07 NOTE — Assessment & Plan Note (Signed)
Uses the tramadol daily No concerns on PDMP

## 2020-02-07 NOTE — Assessment & Plan Note (Signed)
I have personally reviewed the Medicare Annual Wellness questionnaire and have noted 1. The patient's medical and social history 2. Their use of alcohol, tobacco or illicit drugs 3. Their current medications and supplements 4. The patient's functional ability including ADL's, fall risks, home safety risks and hearing or visual             impairment. 5. Diet and physical activities 6. Evidence for depression or mood disorders  The patients weight, height, BMI and visual acuity have been recorded in the chart I have made referrals, counseling and provided education to the patient based review of the above and I have provided the pt with a written personalized care plan for preventive services.  I have provided you with a copy of your personalized plan for preventive services. Please take the time to review along with your updated medication list.  Done with cancer screening--recent PSA and normal colon Doesn't want flu vaccine Urged him to reconsider COVID vaccine Exercises regularly shingrix at pharmacy

## 2020-02-07 NOTE — Assessment & Plan Note (Signed)
Some anxiety Uses the xanax prn

## 2020-02-07 NOTE — Assessment & Plan Note (Signed)
Sleeps well on nightly BiPap

## 2020-02-07 NOTE — Assessment & Plan Note (Signed)
Due for repeat CT angio

## 2020-02-07 NOTE — Assessment & Plan Note (Signed)
BP Readings from Last 3 Encounters:  02/07/20 102/68  01/18/20 (!) 146/83  12/11/19 118/76   Good control on doxazosin  Will recheck labs

## 2020-02-18 DIAGNOSIS — H2513 Age-related nuclear cataract, bilateral: Secondary | ICD-10-CM | POA: Diagnosis not present

## 2020-02-21 ENCOUNTER — Other Ambulatory Visit: Payer: Self-pay | Admitting: Internal Medicine

## 2020-02-22 NOTE — Telephone Encounter (Signed)
Last filled 12-11-19 #20 Last OV 02-07-20 Next OV 02-17-21 [place]

## 2020-03-21 ENCOUNTER — Other Ambulatory Visit: Payer: Self-pay | Admitting: Internal Medicine

## 2020-03-21 NOTE — Telephone Encounter (Signed)
Last written 02-07-20 #90 Last OV physical 02-07-2020 Next OV 02-2021 [facility]

## 2020-04-02 DIAGNOSIS — E7800 Pure hypercholesterolemia, unspecified: Secondary | ICD-10-CM | POA: Diagnosis not present

## 2020-04-07 DIAGNOSIS — H2511 Age-related nuclear cataract, right eye: Secondary | ICD-10-CM | POA: Diagnosis not present

## 2020-04-14 ENCOUNTER — Encounter: Payer: Self-pay | Admitting: Ophthalmology

## 2020-04-14 ENCOUNTER — Other Ambulatory Visit: Payer: Self-pay

## 2020-04-15 ENCOUNTER — Other Ambulatory Visit: Payer: Self-pay | Admitting: Physician Assistant

## 2020-04-15 ENCOUNTER — Other Ambulatory Visit: Payer: Self-pay | Admitting: Internal Medicine

## 2020-04-16 ENCOUNTER — Other Ambulatory Visit
Admission: RE | Admit: 2020-04-16 | Discharge: 2020-04-16 | Disposition: A | Discharge status: Home / Self Care | Payer: Medicare Other | Source: Ambulatory Visit | Attending: Ophthalmology | Admitting: Ophthalmology

## 2020-04-16 ENCOUNTER — Other Ambulatory Visit: Payer: Self-pay

## 2020-04-16 DIAGNOSIS — Z20822 Contact with and (suspected) exposure to covid-19: Secondary | ICD-10-CM | POA: Diagnosis not present

## 2020-04-16 DIAGNOSIS — Z01812 Encounter for preprocedural laboratory examination: Secondary | ICD-10-CM | POA: Diagnosis not present

## 2020-04-16 NOTE — Discharge Instructions (Signed)

## 2020-04-17 LAB — SARS CORONAVIRUS 2 (TAT 6-24 HRS): SARS Coronavirus 2: NEGATIVE

## 2020-04-20 ENCOUNTER — Ambulatory Visit: Payer: Medicare Other | Admitting: Anesthesiology

## 2020-04-20 ENCOUNTER — Other Ambulatory Visit: Payer: Self-pay

## 2020-04-20 ENCOUNTER — Ambulatory Visit
Admission: RE | Admit: 2020-04-20 | Discharge: 2020-04-20 | Disposition: A | Discharge status: Home / Self Care | Payer: Medicare Other | Attending: Ophthalmology | Admitting: Ophthalmology

## 2020-04-20 ENCOUNTER — Encounter
Admission: RE | Disposition: A | Discharge status: Home / Self Care | Payer: Self-pay | Source: Home / Self Care | Attending: Ophthalmology

## 2020-04-20 ENCOUNTER — Encounter: Payer: Self-pay | Admitting: Ophthalmology

## 2020-04-20 DIAGNOSIS — Z8616 Personal history of COVID-19: Secondary | ICD-10-CM | POA: Insufficient documentation

## 2020-04-20 DIAGNOSIS — Z7982 Long term (current) use of aspirin: Secondary | ICD-10-CM | POA: Insufficient documentation

## 2020-04-20 DIAGNOSIS — Z888 Allergy status to other drugs, medicaments and biological substances status: Secondary | ICD-10-CM | POA: Insufficient documentation

## 2020-04-20 DIAGNOSIS — H25811 Combined forms of age-related cataract, right eye: Secondary | ICD-10-CM | POA: Diagnosis not present

## 2020-04-20 DIAGNOSIS — Z885 Allergy status to narcotic agent status: Secondary | ICD-10-CM | POA: Diagnosis not present

## 2020-04-20 DIAGNOSIS — Z91040 Latex allergy status: Secondary | ICD-10-CM | POA: Insufficient documentation

## 2020-04-20 DIAGNOSIS — Z79899 Other long term (current) drug therapy: Secondary | ICD-10-CM | POA: Diagnosis not present

## 2020-04-20 DIAGNOSIS — H2511 Age-related nuclear cataract, right eye: Secondary | ICD-10-CM | POA: Insufficient documentation

## 2020-04-20 HISTORY — DX: Other specified postprocedural states: Z98.890

## 2020-04-20 HISTORY — PX: CATARACT EXTRACTION W/PHACO: SHX586

## 2020-04-20 HISTORY — DX: Nausea with vomiting, unspecified: R11.2

## 2020-04-20 SURGERY — PHACOEMULSIFICATION, CATARACT, WITH IOL INSERTION
Anesthesia: Monitor Anesthesia Care | Site: Eye | Laterality: Right

## 2020-04-20 MED ORDER — MIDAZOLAM HCL 2 MG/2ML IJ SOLN
INTRAMUSCULAR | Status: DC | PRN
  Administered 2020-04-20: 1 mg via INTRAVENOUS

## 2020-04-20 MED ORDER — FENTANYL CITRATE (PF) 100 MCG/2ML IJ SOLN
INTRAMUSCULAR | Status: DC | PRN
  Administered 2020-04-20: 50 ug via INTRAVENOUS

## 2020-04-20 MED ORDER — ONDANSETRON HCL 4 MG/2ML IJ SOLN
INTRAMUSCULAR | Status: DC | PRN
  Administered 2020-04-20: 4 mg via INTRAVENOUS

## 2020-04-20 MED ORDER — ACETAMINOPHEN 160 MG/5ML PO SOLN: 325.0000 mg | ORAL | Status: DC | PRN

## 2020-04-20 MED ORDER — LACTATED RINGERS IV SOLN: INTRAVENOUS | Status: DC

## 2020-04-20 MED ORDER — ACETAMINOPHEN 325 MG PO TABS: 325.0000 mg | ORAL_TABLET | ORAL | Status: DC | PRN

## 2020-04-20 MED ORDER — EPINEPHRINE PF 1 MG/ML IJ SOLN: INTRAOCULAR | Status: DC | PRN

## 2020-04-20 MED ORDER — LIDOCAINE HCL (PF) 2 % IJ SOLN
INTRAOCULAR | Status: DC | PRN
  Administered 2020-04-20: 1 mL via INTRAOCULAR

## 2020-04-20 MED ORDER — SODIUM HYALURONATE 23 MG/ML IO SOLN
INTRAOCULAR | Status: DC | PRN
  Administered 2020-04-20: 0.6 mL via INTRAOCULAR

## 2020-04-20 MED ORDER — TETRACAINE HCL 0.5 % OP SOLN
1.0000 [drp] | OPHTHALMIC | Status: DC | PRN
  Administered 2020-04-20 (×3): 1 [drp] via OPHTHALMIC

## 2020-04-20 MED ORDER — MOXIFLOXACIN HCL 0.5 % OP SOLN
OPHTHALMIC | Status: DC | PRN
  Administered 2020-04-20: 0.2 mL via OPHTHALMIC

## 2020-04-20 MED ORDER — SODIUM HYALURONATE 10 MG/ML IO SOLN
INTRAOCULAR | Status: DC | PRN
  Administered 2020-04-20: 0.55 mL via INTRAOCULAR

## 2020-04-20 MED ORDER — ARMC OPHTHALMIC DILATING DROPS
1.0000 "application " | OPHTHALMIC | Status: DC | PRN
  Administered 2020-04-20 (×3): 1 via OPHTHALMIC

## 2020-04-20 SURGICAL SUPPLY — 19 items
CANNULA ANT/CHMB 27G (MISCELLANEOUS) ×2 IMPLANT
CANNULA ANT/CHMB 27GA (MISCELLANEOUS) ×4 IMPLANT
DISSECTOR HYDRO NUCLEUS 50X22 (MISCELLANEOUS) ×2 IMPLANT
GLOVE SURG LX 7.5 STRW (GLOVE) ×2
GLOVE SURG LX STRL 7.5 STRW (GLOVE) ×1 IMPLANT
GLOVE SURG SYN 8.5  E (GLOVE) ×2
GLOVE SURG SYN 8.5 E (GLOVE) ×1 IMPLANT
GLOVE SURG SYN 8.5 PF PI (GLOVE) ×1 IMPLANT
GOWN STRL REUS W/ TWL LRG LVL3 (GOWN DISPOSABLE) ×2 IMPLANT
GOWN STRL REUS W/TWL LRG LVL3 (GOWN DISPOSABLE) ×4
LENS IOL TECNIS EYHANCE 20.0 (Intraocular Lens) ×1 IMPLANT
MARKER SKIN DUAL TIP RULER LAB (MISCELLANEOUS) ×2 IMPLANT
PACK DR. KING ARMS (PACKS) ×2 IMPLANT
PACK EYE AFTER SURG (MISCELLANEOUS) ×2 IMPLANT
PACK OPTHALMIC (MISCELLANEOUS) ×2 IMPLANT
SYR 3ML LL SCALE MARK (SYRINGE) ×2 IMPLANT
SYR TB 1ML LUER SLIP (SYRINGE) ×2 IMPLANT
WATER STERILE IRR 250ML POUR (IV SOLUTION) ×2 IMPLANT
WIPE NON LINTING 3.25X3.25 (MISCELLANEOUS) ×2 IMPLANT

## 2020-04-20 NOTE — Op Note (Signed)
OPERATIVE NOTE  [name] [id number] 04/20/2020   PREOPERATIVE DIAGNOSIS:  Nuclear sclerotic cataract right eye.  H25.11   POSTOPERATIVE DIAGNOSIS:    Nuclear sclerotic cataract right eye.     PROCEDURE:  Phacoemusification with posterior chamber intraocular lens placement of the right eye   LENS:   Implant Name Type Inv. Item Serial No. Manufacturer Lot No. LRB No. Used Action  LENS IOL TECNIS EYHANCE 20.0 - [id number] Intraocular Lens LENS IOL TECNIS EYHANCE 20.0 [name]   Right 1 Implanted       Procedure(s) with comments: CATARACT EXTRACTION PHACO AND INTRAOCULAR LENS PLACEMENT (IOC) RIGHT (Right) - 5.21 0:38.6  DIB00 +20.0   ULTRASOUND TIME: 0 minutes 38 seconds.  CDE 5.21   SURGEON:  Benay Pillow, MD, MPH  ANESTHESIOLOGIST: Anesthesiologist: Carlos American, MD CRNA: Silvana Newness, CRNA   ANESTHESIA:  Topical with tetracaine drops augmented with 1% preservative-free intracameral lidocaine.  ESTIMATED BLOOD LOSS: less than 1 mL.   COMPLICATIONS:  None.   DESCRIPTION OF PROCEDURE:  The patient was identified in the holding room and transported to the operating room and placed in the supine position under the operating microscope.  The right eye was identified as the operative eye and it was prepped and draped in the usual sterile ophthalmic fashion.   A 1.0 millimeter clear-corneal paracentesis was made at the 10:30 position. 0.5 ml of preservative-free 1% lidocaine with epinephrine was injected into the anterior chamber.  The anterior chamber was filled with Healon 5 viscoelastic.  A 2.4 millimeter keratome was used to make a near-clear corneal incision at the 8:00 position.  A curvilinear capsulorrhexis was made with a cystotome and capsulorrhexis forceps.  Balanced salt solution was used to hydrodissect and hydrodelineate the nucleus.   Phacoemulsification was then used in stop and chop fashion to remove the lens nucleus and epinucleus.  The  remaining cortex was then removed using the irrigation and aspiration handpiece. Healon was then placed into the capsular bag to distend it for lens placement.  A lens was then injected into the capsular bag.  The remaining viscoelastic was aspirated.   Wounds were hydrated with balanced salt solution.  The anterior chamber was inflated to a physiologic pressure with balanced salt solution.   Intracameral vigamox 0.1 mL undiluted was injected into the eye and a drop placed onto the ocular surface.  No wound leaks were noted.  The patient was taken to the recovery room in stable condition without complications of anesthesia or surgery  Benay Pillow 04/20/2020, 12:21 PM

## 2020-04-20 NOTE — Anesthesia Preprocedure Evaluation (Signed)
Anesthesia Evaluation    History of Anesthesia Complications (+) PONV and history of anesthetic complications  Airway Mallampati: II       Dental   Pulmonary sleep apnea ,    Pulmonary exam normal        Cardiovascular hypertension, Normal cardiovascular exam     Neuro/Psych PSYCHIATRIC DISORDERS Anxiety  Neuromuscular disease (R diaphragmatic paralysis)    GI/Hepatic hiatal hernia, GERD  ,  Endo/Other    Renal/GU      Musculoskeletal  (+) Arthritis ,   Abdominal   Peds  Hematology   Anesthesia Other Findings   Reproductive/Obstetrics                             Anesthesia Physical Anesthesia Plan  ASA: III  Anesthesia Plan: MAC   Post-op Pain Management:    Induction: Intravenous  PONV Risk Score and Plan:   Airway Management Planned: Natural Airway  Additional Equipment:   Intra-op Plan:   Post-operative Plan:   Informed Consent: I have reviewed the patients History and Physical, chart, labs and discussed the procedure including the risks, benefits and alternatives for the proposed anesthesia with the patient or authorized representative who has indicated his/her understanding and acceptance.       Plan Discussed with:   Anesthesia Plan Comments:         Anesthesia Quick Evaluation

## 2020-04-20 NOTE — Transfer of Care (Signed)
Immediate Anesthesia Transfer of Care Note  Patient: [name]  Procedure(s) Performed: CATARACT EXTRACTION PHACO AND INTRAOCULAR LENS PLACEMENT (IOC) RIGHT (Right Eye)  Patient Location: PACU  Anesthesia Type: MAC  Level of Consciousness: awake, alert  and patient cooperative  Airway and Oxygen Therapy: Patient Spontanous Breathing and Patient connected to supplemental oxygen  Post-op Assessment: Post-op Vital signs reviewed, Patient's Cardiovascular Status Stable, Respiratory Function Stable, Patent Airway and No signs of Nausea or vomiting  Post-op Vital Signs: Reviewed and stable  Complications: No complications documented.

## 2020-04-20 NOTE — Anesthesia Procedure Notes (Signed)
Procedure Name: MAC Date/Time: 04/20/2020 12:03 PM Performed by: Silvana Newness, CRNA Pre-anesthesia Checklist: Patient identified, Emergency Drugs available, Suction available, Patient being monitored and Timeout performed Patient Re-evaluated:Patient Re-evaluated prior to induction Oxygen Delivery Method: Nasal cannula Placement Confirmation: positive ETCO2

## 2020-04-20 NOTE — H&P (Signed)
[facility]   Primary Care Physician:  Venia Carbon, MD Ophthalmologist: Dr. Benay Pillow  Pre-Procedure History & Physical: HPI:  [name] is a 79 y.o. male here for cataract surgery.   Past Medical History:  Diagnosis Date  . Allergy   . Anxiety   . Aortic stenosis   . Barrett's esophagus   . BPH (benign prostatic hypertrophy)   . Diaphragm dysfunction    right frozen diaphragm  . Diverticulitis   . GERD (gastroesophageal reflux disease)   . Hiatal hernia   . Hx of adenomatous colonic polyps   . Hyperlipidemia   . Kidney stones   . Obstructive sleep apnea    BiPAP  . Osteoarthritis   . Osteoporosis   . PONV (postoperative nausea and vomiting)   . Spinal stenosis     Past Surgical History:  Procedure Laterality Date  . ESOPHAGOGASTRODUODENOSCOPY     colon--6/04, Barrett's--7/05, Barrett's but no cancer--4/08  . EYE MUSCLE SURGERY     L eye muscle surgery--2/01  . JOINT REPLACEMENT  1/12   left TKR  . KNEE ARTHROSCOPY     7/ 09 Arthroscopy left knee--Dr Noemi Chapel  . LIPOMA EXCISION Left 2010   compressing nerve by spine---Dr Terald Sleeper at [facility]  . ROTATOR CUFF REPAIR     R rotator cuff repair--1/00  Noemi Chapel)  . ROTATOR CUFF REPAIR Left ~2005 & 11/15   biceps repair also in 2015  . TENDON REPAIR     Left knee tendon repair--1966  . TOTAL KNEE ARTHROPLASTY Right 4/16  . VASECTOMY     Vasectomy/spermatocele/?testicular cyst--1970    Prior to Admission medications   Medication Sig Start Date End Date Taking? Authorizing Provider  acetaminophen (TYLENOL) 500 MG tablet Take 500 mg by mouth every 8 (eight) hours as needed (for pain).    Yes [provider]  alfuzosin (UROXATRAL) 10 MG 24 hr tablet Take 1 tablet (10 mg total) by mouth daily. 03/06/19  Yes Venia Carbon, MD  ALPRAZolam Duanne Moron) 0.25 MG tablet TAKE ONE TABLET BY MOUTH TWICE A DAY AS NEEDED FOR ANXIETY 02/23/20  Yes Venia Carbon, MD  Black Pepper-Turmeric (TURMERIC  COMPLEX/BLACK PEPPER PO) Take by mouth.   Yes [provider]  diclofenac (VOLTAREN) 75 MG EC tablet TAKE 1 TABLET BY MOUTH TWICE A DAY 10/11/19  Yes Venia Carbon, MD  Docusate Calcium (STOOL SOFTENER PO) Take by mouth.   Yes [provider]  doxazosin (CARDURA) 2 MG tablet Take 2 mg by mouth daily.   Yes [provider]  esomeprazole (NEXIUM) 40 MG capsule TAKE ONE CAPSULE BY MOUTH DAILY BEFORE BREAKFAST 04/15/20  Yes Venia Carbon, MD  finasteride (PROSCAR) 5 MG tablet Take 1 tablet (5 mg total) by mouth daily. 11/18/19  Yes Venia Carbon, MD  loratadine (CLARITIN) 10 MG tablet Take 10 mg by mouth daily.   Yes [provider]  OVER THE COUNTER MEDICATION Deep Blue Polyphenol   Yes [provider]  pravastatin (PRAVACHOL) 40 MG tablet Take 1 tablet (40 mg total) by mouth every evening. 08/27/19 04/14/20 Yes Venia Carbon, MD  traMADol (ULTRAM) 50 MG tablet TAKE 1 TABLET (50 MG TOTAL) BY MOUTH 3 (THREE) TIMES DAILY AS NEEDED. 03/22/20  Yes Venia Carbon, MD  aspirin 81 MG tablet Take 81 mg by mouth every other day.    [provider]  fluticasone (FLONASE) 50 MCG/ACT nasal spray Place 1 spray into both nostrils daily. 08/22/18  11/12/19  Lesleigh Noe, MD    Allergies as of 02/25/2020 - Review Complete 02/07/2020  Allergen Reaction Noted  . Anesthetics, amide Nausea Only 12/18/2013  . Other Nausea Only and Nausea And Vomiting 12/18/2013  . Atorvastatin  04/21/2006  . Oxycodone Nausea And Vomiting 10/28/2014  . Simvastatin  04/21/2006  . Latex Rash 04/07/2014  . Statins Other (See Comments) 11/17/2014    Family History  Problem Relation Age of Onset  . Hypertension Mother   . Breast cancer Mother   . Colon polyps Mother   . Coronary artery disease Mother   . Diabetes Neg Hx   . Colon cancer Neg Hx   . Esophageal cancer Neg Hx   . Gallbladder disease Neg Hx   . Kidney disease Neg Hx     Social History    Socioeconomic History  . Marital status: Married    Spouse name: Not on file  . Number of children: 3  . Years of education: Not on file  . Highest education level: Not on file  Occupational History  . Occupation: retired    Fish farm manager: RETIRED  Tobacco Use  . Smoking status: Never Smoker  . Smokeless tobacco: Never Used  Vaping Use  . Vaping Use: Never used  Substance and Sexual Activity  . Alcohol use: Not Currently    Alcohol/week: 0.0 standard drinks    Comment: occassional wine  . Drug use: No  . Sexual activity: Not on file  Other Topics Concern  . Not on file  Social History Narrative   Lives at home with wife      Has living will   Wife is health care POA---alternate daughter [name]   Would accept resuscitation attempts   No tube feeds if cognitively unaware         Social Determinants of Health   Financial Resource Strain: Not on file  Food Insecurity: Not on file  Transportation Needs: Not on file  Physical Activity: Not on file  Stress: Not on file  Social Connections: Not on file  Intimate Partner Violence: Not on file    Review of Systems: See HPI, otherwise negative ROS  Physical Exam: BP 135/80   Pulse 78   Temp (!) 97.1 F (36.2 C) (Temporal)   Resp 16   Ht 5\' 6"  (1.676 m)   Wt 96.6 kg   SpO2 96%   BMI 34.38 kg/m  General:   Alert,  pleasant and cooperative in NAD Head:  Normocephalic and atraumatic. Respiratory:  Normal work of breathing.  Impression/Plan: [name] is here for cataract surgery.  Risks, benefits, limitations, and alternatives regarding cataract surgery have been reviewed with the patient.  Questions have been answered.  All parties agreeable.   Benay Pillow, MD  04/20/2020, 11:44 AM

## 2020-04-20 NOTE — Anesthesia Postprocedure Evaluation (Signed)
Anesthesia Post Note  Patient: [name]  Procedure(s) Performed: CATARACT EXTRACTION PHACO AND INTRAOCULAR LENS PLACEMENT (IOC) RIGHT (Right Eye)     Patient location during evaluation: PACU Anesthesia Type: MAC Level of consciousness: awake and alert Pain management: pain level controlled Vital Signs Assessment: post-procedure vital signs reviewed and stable Respiratory status: spontaneous breathing, nonlabored ventilation, respiratory function stable and patient connected to nasal cannula oxygen Cardiovascular status: stable and blood pressure returned to baseline Postop Assessment: no apparent nausea or vomiting Anesthetic complications: no   No complications documented.  Wanda Plump Riyana Biel

## 2020-04-22 ENCOUNTER — Other Ambulatory Visit: Payer: Self-pay | Admitting: Internal Medicine

## 2020-04-23 NOTE — Telephone Encounter (Signed)
Last filled 02-23-20 #20 Last OV 02-07-20 Next OV 02-17-21 [name]

## 2020-04-29 ENCOUNTER — Encounter: Payer: Self-pay | Admitting: Ophthalmology

## 2020-04-29 ENCOUNTER — Other Ambulatory Visit: Payer: Self-pay

## 2020-04-30 DIAGNOSIS — H2512 Age-related nuclear cataract, left eye: Secondary | ICD-10-CM | POA: Diagnosis not present

## 2020-04-30 DIAGNOSIS — M1991 Primary osteoarthritis, unspecified site: Secondary | ICD-10-CM | POA: Diagnosis not present

## 2020-05-06 DIAGNOSIS — J392 Other diseases of pharynx: Secondary | ICD-10-CM | POA: Diagnosis not present

## 2020-05-06 DIAGNOSIS — H903 Sensorineural hearing loss, bilateral: Secondary | ICD-10-CM | POA: Diagnosis not present

## 2020-05-06 DIAGNOSIS — H6123 Impacted cerumen, bilateral: Secondary | ICD-10-CM | POA: Diagnosis not present

## 2020-05-06 NOTE — Discharge Instructions (Signed)

## 2020-05-07 ENCOUNTER — Other Ambulatory Visit: Payer: Self-pay

## 2020-05-07 ENCOUNTER — Other Ambulatory Visit
Admission: RE | Admit: 2020-05-07 | Discharge: 2020-05-07 | Disposition: A | Discharge status: Home / Self Care | Payer: Medicare Other | Source: Ambulatory Visit | Attending: Ophthalmology | Admitting: Ophthalmology

## 2020-05-07 DIAGNOSIS — Z01812 Encounter for preprocedural laboratory examination: Secondary | ICD-10-CM | POA: Insufficient documentation

## 2020-05-07 DIAGNOSIS — Z20822 Contact with and (suspected) exposure to covid-19: Secondary | ICD-10-CM | POA: Diagnosis not present

## 2020-05-07 LAB — SARS CORONAVIRUS 2 (TAT 6-24 HRS): SARS Coronavirus 2: NEGATIVE

## 2020-05-11 ENCOUNTER — Other Ambulatory Visit: Payer: Self-pay

## 2020-05-11 ENCOUNTER — Ambulatory Visit: Payer: Medicare Other | Admitting: Anesthesiology

## 2020-05-11 ENCOUNTER — Encounter: Payer: Self-pay | Admitting: Ophthalmology

## 2020-05-11 ENCOUNTER — Ambulatory Visit
Admission: RE | Admit: 2020-05-11 | Discharge: 2020-05-11 | Disposition: A | Discharge status: Home / Self Care | Payer: Medicare Other | Attending: Ophthalmology | Admitting: Ophthalmology

## 2020-05-11 ENCOUNTER — Encounter
Admission: RE | Disposition: A | Discharge status: Home / Self Care | Payer: Self-pay | Source: Home / Self Care | Attending: Ophthalmology

## 2020-05-11 DIAGNOSIS — Z888 Allergy status to other drugs, medicaments and biological substances status: Secondary | ICD-10-CM | POA: Insufficient documentation

## 2020-05-11 DIAGNOSIS — Z7982 Long term (current) use of aspirin: Secondary | ICD-10-CM | POA: Diagnosis not present

## 2020-05-11 DIAGNOSIS — Z79899 Other long term (current) drug therapy: Secondary | ICD-10-CM | POA: Insufficient documentation

## 2020-05-11 DIAGNOSIS — H2512 Age-related nuclear cataract, left eye: Secondary | ICD-10-CM | POA: Insufficient documentation

## 2020-05-11 DIAGNOSIS — Z8371 Family history of colonic polyps: Secondary | ICD-10-CM | POA: Insufficient documentation

## 2020-05-11 DIAGNOSIS — Z9841 Cataract extraction status, right eye: Secondary | ICD-10-CM | POA: Diagnosis not present

## 2020-05-11 DIAGNOSIS — Z8249 Family history of ischemic heart disease and other diseases of the circulatory system: Secondary | ICD-10-CM | POA: Insufficient documentation

## 2020-05-11 DIAGNOSIS — Z961 Presence of intraocular lens: Secondary | ICD-10-CM | POA: Diagnosis not present

## 2020-05-11 DIAGNOSIS — I10 Essential (primary) hypertension: Secondary | ICD-10-CM | POA: Diagnosis not present

## 2020-05-11 DIAGNOSIS — H25812 Combined forms of age-related cataract, left eye: Secondary | ICD-10-CM | POA: Diagnosis not present

## 2020-05-11 HISTORY — PX: CATARACT EXTRACTION W/PHACO: SHX586

## 2020-05-11 SURGERY — PHACOEMULSIFICATION, CATARACT, WITH IOL INSERTION
Anesthesia: Monitor Anesthesia Care | Site: Eye | Laterality: Left

## 2020-05-11 MED ORDER — MOXIFLOXACIN HCL 0.5 % OP SOLN
OPHTHALMIC | Status: DC | PRN
  Administered 2020-05-11: 0.2 mL via OPHTHALMIC

## 2020-05-11 MED ORDER — TETRACAINE HCL 0.5 % OP SOLN
1.0000 [drp] | OPHTHALMIC | Status: DC | PRN
  Administered 2020-05-11 (×3): 1 [drp] via OPHTHALMIC

## 2020-05-11 MED ORDER — LIDOCAINE HCL (PF) 2 % IJ SOLN
INTRAOCULAR | Status: DC | PRN
  Administered 2020-05-11: 1 mL via INTRAOCULAR

## 2020-05-11 MED ORDER — ONDANSETRON HCL 4 MG/2ML IJ SOLN
INTRAMUSCULAR | Status: DC | PRN
  Administered 2020-05-11: 4 mg via INTRAVENOUS

## 2020-05-11 MED ORDER — LACTATED RINGERS IV SOLN: INTRAVENOUS | Status: DC

## 2020-05-11 MED ORDER — MIDAZOLAM HCL 2 MG/2ML IJ SOLN
INTRAMUSCULAR | Status: DC | PRN
  Administered 2020-05-11: 2 mg via INTRAVENOUS

## 2020-05-11 MED ORDER — EPINEPHRINE PF 1 MG/ML IJ SOLN
INTRAOCULAR | Status: DC | PRN
  Administered 2020-05-11: 88 mL via OPHTHALMIC

## 2020-05-11 MED ORDER — SODIUM HYALURONATE 23 MG/ML IO SOLN
INTRAOCULAR | Status: DC | PRN
  Administered 2020-05-11: 0.6 mL via INTRAOCULAR

## 2020-05-11 MED ORDER — ARMC OPHTHALMIC DILATING DROPS
1.0000 "application " | OPHTHALMIC | Status: DC | PRN
  Administered 2020-05-11 (×3): 1 via OPHTHALMIC

## 2020-05-11 MED ORDER — FENTANYL CITRATE (PF) 100 MCG/2ML IJ SOLN
INTRAMUSCULAR | Status: DC | PRN
  Administered 2020-05-11: 50 ug via INTRAVENOUS

## 2020-05-11 MED ORDER — SODIUM HYALURONATE 10 MG/ML IO SOLN
INTRAOCULAR | Status: DC | PRN
  Administered 2020-05-11: 0.55 mL via INTRAOCULAR

## 2020-05-11 SURGICAL SUPPLY — 19 items
CANNULA ANT/CHMB 27G (MISCELLANEOUS) ×2 IMPLANT
CANNULA ANT/CHMB 27GA (MISCELLANEOUS) ×4 IMPLANT
DISSECTOR HYDRO NUCLEUS 50X22 (MISCELLANEOUS) ×2 IMPLANT
GLOVE SURG LX 7.5 STRW (GLOVE) ×2
GLOVE SURG LX STRL 7.5 STRW (GLOVE) ×1 IMPLANT
GLOVE SURG SYN 8.5  E (GLOVE) ×2
GLOVE SURG SYN 8.5 E (GLOVE) ×1 IMPLANT
GLOVE SURG SYN 8.5 PF PI (GLOVE) ×1 IMPLANT
GOWN STRL REUS W/ TWL LRG LVL3 (GOWN DISPOSABLE) ×2 IMPLANT
GOWN STRL REUS W/TWL LRG LVL3 (GOWN DISPOSABLE) ×4
LENS IOL TECNIS EYHANCE 21.0 (Intraocular Lens) ×1 IMPLANT
MARKER SKIN DUAL TIP RULER LAB (MISCELLANEOUS) ×2 IMPLANT
PACK DR. KING ARMS (PACKS) ×2 IMPLANT
PACK EYE AFTER SURG (MISCELLANEOUS) ×2 IMPLANT
PACK OPTHALMIC (MISCELLANEOUS) ×2 IMPLANT
SYR 3ML LL SCALE MARK (SYRINGE) ×2 IMPLANT
SYR TB 1ML LUER SLIP (SYRINGE) ×2 IMPLANT
WATER STERILE IRR 250ML POUR (IV SOLUTION) ×2 IMPLANT
WIPE NON LINTING 3.25X3.25 (MISCELLANEOUS) ×2 IMPLANT

## 2020-05-11 NOTE — Anesthesia Preprocedure Evaluation (Signed)
Anesthesia Evaluation  Patient identified by MRN, date of birth, ID band Patient awake    Reviewed: Allergy & Precautions, H&P , NPO status , Patient's Chart, lab work & pertinent test results  Airway Mallampati: II  TM Distance: >3 FB Neck ROM: full    Dental no notable dental hx.    Pulmonary sleep apnea ,    Pulmonary exam normal        Cardiovascular hypertension, On Medications Normal cardiovascular exam Rhythm:regular Rate:Normal     Neuro/Psych Anxiety    GI/Hepatic hiatal hernia, Medicated,  Endo/Other  negative endocrine ROS  Renal/GU CRF     Musculoskeletal   Abdominal   Peds  Hematology negative hematology ROS (+)   Anesthesia Other Findings   Reproductive/Obstetrics                             Anesthesia Physical Anesthesia Plan  ASA: II  Anesthesia Plan: MAC   Post-op Pain Management:    Induction:   PONV Risk Score and Plan: 1 and Treatment may vary due to age or medical condition  Airway Management Planned:   Additional Equipment:   Intra-op Plan:   Post-operative Plan:   Informed Consent: I have reviewed the patients History and Physical, chart, labs and discussed the procedure including the risks, benefits and alternatives for the proposed anesthesia with the patient or authorized representative who has indicated his/her understanding and acceptance.       Plan Discussed with:   Anesthesia Plan Comments:         Anesthesia Quick Evaluation

## 2020-05-11 NOTE — Op Note (Signed)
OPERATIVE NOTE  [name] [id number] 05/11/2020   PREOPERATIVE DIAGNOSIS:  Nuclear sclerotic cataract left eye.  H25.12   POSTOPERATIVE DIAGNOSIS:    Nuclear sclerotic cataract left eye.     PROCEDURE:  Phacoemusification with posterior chamber intraocular lens placement of the left eye   LENS:   Implant Name Type Inv. Item Serial No. Manufacturer Lot No. LRB No. Used Action  LENS IOL TECNIS EYHANCE 21.0 - [id number] Intraocular Lens LENS IOL TECNIS EYHANCE 21.0 [id number] [name]   Left 1 Implanted      Procedure(s) with comments: CATARACT EXTRACTION PHACO AND INTRAOCULAR LENS PLACEMENT (IOC) LEFT (Left) - 4.15 0:30.7  DIB00 +21.0   ULTRASOUND TIME: 0 minutes 30 seconds.  CDE 4.15   SURGEON:  Willey Blade, MD, MPH   ANESTHESIA:  Topical with tetracaine drops augmented with 1% preservative-free intracameral lidocaine.  ESTIMATED BLOOD LOSS: <1 mL   COMPLICATIONS:  None.   DESCRIPTION OF PROCEDURE:  The patient was identified in the holding room and transported to the operating room and placed in the supine position under the operating microscope.  The left eye was identified as the operative eye and it was prepped and draped in the usual sterile ophthalmic fashion.   A 1.0 millimeter clear-corneal paracentesis was made at the 5:00 position. 0.5 ml of preservative-free 1% lidocaine with epinephrine was injected into the anterior chamber.  The anterior chamber was filled with Healon 5 viscoelastic.  A 2.4 millimeter keratome was used to make a near-clear corneal incision at the 2:00 position.  A curvilinear capsulorrhexis was made with a cystotome and capsulorrhexis forceps.  Balanced salt solution was used to hydrodissect and hydrodelineate the nucleus.   Phacoemulsification was then used in stop and chop fashion to remove the lens nucleus and epinucleus.  The remaining cortex was then removed using the irrigation and aspiration handpiece. Healon was then placed into the  capsular bag to distend it for lens placement.  A lens was then injected into the capsular bag.  The remaining viscoelastic was aspirated.   Wounds were hydrated with balanced salt solution.  The anterior chamber was inflated to a physiologic pressure with balanced salt solution.  Intracameral vigamox 0.1 mL undiltued was injected into the eye and a drop placed onto the ocular surface.  No wound leaks were noted.  The patient was taken to the recovery room in stable condition without complications of anesthesia or surgery  Willey Blade 05/11/2020, 9:26 AM

## 2020-05-11 NOTE — Transfer of Care (Signed)
Immediate Anesthesia Transfer of Care Note  Patient: [name]  Procedure(s) Performed: CATARACT EXTRACTION PHACO AND INTRAOCULAR LENS PLACEMENT (IOC) LEFT (Left Eye)  Patient Location: PACU  Anesthesia Type: MAC  Level of Consciousness: awake, alert  and patient cooperative  Airway and Oxygen Therapy: Patient Spontanous Breathing and Patient connected to supplemental oxygen  Post-op Assessment: Post-op Vital signs reviewed, Patient's Cardiovascular Status Stable, Respiratory Function Stable, Patent Airway and No signs of Nausea or vomiting  Post-op Vital Signs: Reviewed and stable  Complications: No complications documented.

## 2020-05-11 NOTE — H&P (Signed)
[facility]   Primary Care Physician:  Karie SchwalbeLetvak, Richard I, MD Ophthalmologist: Dr. Willey BladeBradley Dontrail Blackwell  Pre-Procedure History & Physical: HPI:  Arthur Snipesaron L Lefeber is a 80 y.o. male here for cataract surgery.   Past Medical History:  Diagnosis Date  . Allergy   . Anxiety   . Aortic stenosis   . Barrett's esophagus   . BPH (benign prostatic hypertrophy)   . Diaphragm dysfunction    right frozen diaphragm  . Diverticulitis   . GERD (gastroesophageal reflux disease)   . Hiatal hernia   . Hx of adenomatous colonic polyps   . Hyperlipidemia   . Kidney stones   . Obstructive sleep apnea    BiPAP  . Osteoarthritis   . Osteoporosis   . PONV (postoperative nausea and vomiting)   . Spinal stenosis     Past Surgical History:  Procedure Laterality Date  . CATARACT EXTRACTION W/PHACO Right 04/20/2020   Procedure: CATARACT EXTRACTION PHACO AND INTRAOCULAR LENS PLACEMENT (IOC) RIGHT;  Surgeon: Nevada CraneKing, Manasvini Whatley Mark, MD;  [facility];  Service: Ophthalmology;  Laterality: Right;  5.21 0:38.6  . ESOPHAGOGASTRODUODENOSCOPY     colon--6/04, Barrett's--7/05, Barrett's but no cancer--4/08  . EYE MUSCLE SURGERY     L eye muscle surgery--2/01  . JOINT REPLACEMENT  1/12   left TKR  . KNEE ARTHROSCOPY     7/ 09 Arthroscopy left knee--Dr Thurston HoleWainer  . LIPOMA EXCISION Left 2010   comp[facility] nerve by spine---[facility]  . ROTATOR CUFF REPAIR     R rotator cuff repair--1/00  Thurston Hole(Wainer)  . ROTATOR CUFF REPAIR Left ~2005 & 11/15   biceps repair also in 2015  . TENDON REPAIR     Left knee tendon repair--1966  . TOTAL KNEE ARTHROPLASTY Right 4/16  . VASECTOMY     Vasectomy/spermatocele/?testicular cyst--1970    Prior to Admission medications   Medication Sig Start Date End Date Taking? Authorizing Provider  acetaminophen (TYLENOL) 500 MG tablet Take 500 mg by mouth every 8 (eight) hours as needed (for pain).    Yes [provider]  alfuzosin (UROXATRAL) 10 MG 24 hr  tablet Take 1 tablet (10 mg total) by mouth daily. 03/06/19  Yes Karie SchwalbeLetvak, Richard I, MD  ALPRAZolam Prudy Feeler(XANAX) 0.25 MG tablet TAKE ONE TABLET BY MOUTH TWICE A DAY AS NEEDED FOR ANXIETY 04/23/20  Yes Karie SchwalbeLetvak, Richard I, MD  aspirin 81 MG tablet Take 81 mg by mouth every other day.   Yes [provider]  Black Pepper-Turmeric (TURMERIC COMPLEX/BLACK PEPPER PO) Take by mouth.   Yes [provider]  diclofenac (VOLTAREN) 75 MG EC tablet TAKE 1 TABLET BY MOUTH TWICE A DAY 10/11/19  Yes Karie SchwalbeLetvak, Richard I, MD  doxazosin (CARDURA) 2 MG tablet Take 2 mg by mouth daily.   Yes [provider]  esomeprazole (NEXIUM) 40 MG capsule TAKE ONE CAPSULE BY MOUTH DAILY BEFORE BREAKFAST 04/15/20  Yes Karie SchwalbeLetvak, Richard I, MD  finasteride (PROSCAR) 5 MG tablet Take 1 tablet (5 mg total) by mouth daily. 11/18/19  Yes Karie SchwalbeLetvak, Richard I, MD  loratadine (CLARITIN) 10 MG tablet Take 10 mg by mouth daily.   Yes [provider]  OVER THE COUNTER MEDICATION Deep Blue Polyphenol   Yes [provider]  traMADol (ULTRAM) 50 MG tablet TAKE 1 TABLET (50 MG TOTAL) BY MOUTH 3 (THREE) TIMES DAILY AS NEEDED. 03/22/20  Yes Karie SchwalbeLetvak, Richard I, MD  Docusate Calcium (STOOL SOFTENER PO) Take by mouth.    [provider]  fluticasone (FLONASE) 50 MCG/ACT nasal spray Place 1 spray into both nostrils daily. 08/22/18 11/12/19  Lynnda Child, MD  pravastatin (PRAVACHOL) 40 MG tablet Take 1 tablet (40 mg total) by mouth every evening. 08/27/19 04/14/20  Karie Schwalbe, MD    Allergies as of 04/27/2020 - Review Complete 04/20/2020  Allergen Reaction Noted  . Anesthetics, amide Nausea Only 12/18/2013  . Other Nausea Only and Nausea And Vomiting 12/18/2013  . Atorvastatin  04/21/2006  . Oxycodone Nausea And Vomiting 10/28/2014  . Simvastatin  04/21/2006  . Latex Rash 04/07/2014  . Statins Other (See Comments) 11/17/2014    Family History  Problem Relation Age of Onset  . Hypertension Mother   .  Breast cancer Mother   . Colon polyps Mother   . Coronary artery disease Mother   . Diabetes Neg Hx   . Colon cancer Neg Hx   . Esophageal cancer Neg Hx   . Gallbladder disease Neg Hx   . Kidney disease Neg Hx     Social History   Socioeconomic History  . Marital status: Married    Spouse name: Not on file  . Number of children: 3  . Years of education: Not on file  . Highest education level: Not on file  Occupational History  . Occupation: retired    Associate Professor: RETIRED  Tobacco Use  . Smoking status: Never Smoker  . Smokeless tobacco: Never Used  Vaping Use  . Vaping Use: Never used  Substance and Sexual Activity  . Alcohol use: Not Currently    Alcohol/week: 0.0 standard drinks    Comment: occassional wine  . Drug use: No  . Sexual activity: Not on file  Other Topics Concern  . Not on file  Social History Narrative   Lives at home with wife      Has living will   Wife is health care POA---alternate daughter Zella Ball   Would accept resuscitation attempts   No tube feeds if cognitively unaware         Social Determinants of Health   Financial Resource Strain: Not on file  Food Insecurity: Not on file  Transportation Needs: Not on file  Physical Activity: Not on file  Stress: Not on file  Social Connections: Not on file  Intimate Partner Violence: Not on file    Review of Systems: See HPI, otherwise negative ROS  Physical Exam: BP 127/73   Pulse 74   Temp (!) 96.8 F (36 C) (Temporal)   Resp 18   Ht 5\' 6"  (1.676 m)   Wt 96.6 kg   SpO2 97%   BMI 34.38 kg/m  General:   Alert,  pleasant and cooperative in NAD Head:  Normocephalic and atraumatic. Respiratory:  Normal work of breathing.  Impression/Plan: is here for cataract surgery.  Risks, benefits, limitations, and alternatives regarding cataract surgery have been reviewed with the patient.  Questions have been answered.  All parties agreeable.   Arthur Snipes, MD  05/11/2020, 8:54  AM

## 2020-05-11 NOTE — Anesthesia Procedure Notes (Signed)
Procedure Name: MAC Date/Time: 05/11/2020 9:06 AM Performed by: Jeannene Patella, CRNA Pre-anesthesia Checklist: Patient identified, Emergency Drugs available, Suction available, Timeout performed and Patient being monitored Patient Re-evaluated:Patient Re-evaluated prior to induction Oxygen Delivery Method: Nasal cannula Placement Confirmation: positive ETCO2

## 2020-05-11 NOTE — Anesthesia Postprocedure Evaluation (Signed)
Anesthesia Post Note  Patient: [name]  Procedure(s) Performed: CATARACT EXTRACTION PHACO AND INTRAOCULAR LENS PLACEMENT (IOC) LEFT (Left Eye)     Patient location during evaluation: PACU Anesthesia Type: MAC Level of consciousness: awake and alert Pain management: pain level controlled Vital Signs Assessment: post-procedure vital signs reviewed and stable Respiratory status: spontaneous breathing Cardiovascular status: stable Anesthetic complications: no   No complications documented.  Gillian Scarce

## 2020-05-13 ENCOUNTER — Encounter: Payer: Self-pay | Admitting: Ophthalmology

## 2020-05-18 ENCOUNTER — Other Ambulatory Visit: Payer: Self-pay | Admitting: Internal Medicine

## 2020-05-18 NOTE — Telephone Encounter (Signed)
Pease advise on refill. Thank you.  

## 2020-05-24 ENCOUNTER — Other Ambulatory Visit: Payer: Self-pay | Admitting: Internal Medicine

## 2020-05-28 ENCOUNTER — Other Ambulatory Visit: Payer: Self-pay | Admitting: Internal Medicine

## 2020-05-28 NOTE — Telephone Encounter (Signed)
Last filled 04-23-20 #20 Last OV 02-17-21 Next OV 02-07-20 [name]

## 2020-07-13 ENCOUNTER — Other Ambulatory Visit: Payer: Self-pay | Admitting: Internal Medicine

## 2020-07-14 NOTE — Telephone Encounter (Signed)
Last written 05-18-20 #90 Last OV physical 02-07-2020 Next OV [id number] [facility]

## 2020-07-21 ENCOUNTER — Ambulatory Visit (INDEPENDENT_AMBULATORY_CARE_PROVIDER_SITE_OTHER): Payer: Medicare Other | Admitting: Internal Medicine

## 2020-07-21 ENCOUNTER — Encounter: Payer: Self-pay | Admitting: Internal Medicine

## 2020-07-21 ENCOUNTER — Other Ambulatory Visit: Payer: Self-pay

## 2020-07-21 VITALS — BP 110/64 | HR 62 | Temp 97.5°F | Ht 66.0 in | Wt 214.0 lb

## 2020-07-21 DIAGNOSIS — I712 Thoracic aortic aneurysm, without rupture: Secondary | ICD-10-CM

## 2020-07-21 DIAGNOSIS — R5383 Other fatigue: Secondary | ICD-10-CM | POA: Diagnosis not present

## 2020-07-21 DIAGNOSIS — F39 Unspecified mood [affective] disorder: Secondary | ICD-10-CM | POA: Diagnosis not present

## 2020-07-21 DIAGNOSIS — F1120 Opioid dependence, uncomplicated: Secondary | ICD-10-CM | POA: Diagnosis not present

## 2020-07-21 DIAGNOSIS — M48061 Spinal stenosis, lumbar region without neurogenic claudication: Secondary | ICD-10-CM | POA: Diagnosis not present

## 2020-07-21 DIAGNOSIS — R200 Anesthesia of skin: Secondary | ICD-10-CM | POA: Diagnosis not present

## 2020-07-21 DIAGNOSIS — I7121 Aneurysm of the ascending aorta, without rupture: Secondary | ICD-10-CM

## 2020-07-21 LAB — LIPID PANEL
Cholesterol: 166 mg/dL (ref 0–200)
HDL: 46.9 mg/dL (ref >39.00)
LDL Cholesterol: 105 mg/dL — ABNORMAL HIGH (ref 0–99)
NonHDL: 118.95
Total CHOL/HDL Ratio: 4
Triglycerides: 68 mg/dL (ref 0.0–149.0)
VLDL: 13.6 mg/dL (ref 0.0–40.0)

## 2020-07-21 LAB — COMPREHENSIVE METABOLIC PANEL
ALT: 14 U/L (ref 0–53)
AST: 17 U/L (ref 0–37)
Albumin: 4.1 g/dL (ref 3.5–5.2)
Alkaline Phosphatase: 44 U/L (ref 39–117)
BUN: 20 mg/dL (ref 6–23)
CO2: 29 mEq/L (ref 19–32)
Calcium: 9.1 mg/dL (ref 8.4–10.5)
Chloride: 102 mEq/L (ref 96–112)
Creatinine, Ser: 1 mg/dL (ref 0.40–1.50)
GFR: 71.8 mL/min (ref >60.00)
Glucose, Bld: 86 mg/dL (ref 70–99)
Potassium: 4.4 mEq/L (ref 3.5–5.1)
Sodium: 138 mEq/L (ref 135–145)
Total Bilirubin: 0.9 mg/dL (ref 0.2–1.2)
Total Protein: 6.1 g/dL (ref 6.0–8.3)

## 2020-07-21 LAB — CBC
HCT: 39.2 % (ref 39.0–52.0)
Hemoglobin: 13 g/dL (ref 13.0–17.0)
MCHC: 33.3 g/dL (ref 30.0–36.0)
MCV: 86.2 fl (ref 78.0–100.0)
Platelets: 207 10*3/uL (ref 150.0–400.0)
RBC: 4.54 Mil/uL (ref 4.22–5.81)
RDW: 14.2 % (ref 11.5–15.5)
WBC: 6 10*3/uL (ref 4.0–10.5)

## 2020-07-21 LAB — T4, FREE: Free T4: 1.13 ng/dL (ref 0.60–1.60)

## 2020-07-21 LAB — VITAMIN B12: Vitamin B-12: 183 pg/mL — ABNORMAL LOW (ref 211–911)

## 2020-07-21 LAB — SEDIMENTATION RATE: Sed Rate: 1 mm/hr (ref 0–20)

## 2020-07-21 MED ORDER — ALPRAZOLAM 0.25 MG PO TABS: ORAL_TABLET | ORAL | 0 refills | Status: DC

## 2020-07-21 NOTE — Assessment & Plan Note (Signed)
Non specific Doesn't seem to have sleep disorder---does well with BiPap. Did discuss sleep compression though to lessen AM awake time (3AM) Will check labs

## 2020-07-21 NOTE — Assessment & Plan Note (Addendum)
Chronic pain and can't walk far Now just started back at the gym Hopefully this will help his stamina and energy Will try lidocaine patch---will try to change AM tramadol to prn

## 2020-07-21 NOTE — Assessment & Plan Note (Signed)
Generally uses 2 tramadol a day

## 2020-07-21 NOTE — Assessment & Plan Note (Signed)
Due for CT angio in July Continues on statin

## 2020-07-21 NOTE — Progress Notes (Signed)
Subjective:    Patient ID: [name], male    DOB: 12/13/1940, 80 y.o.   MRN: [id number]  HPI Here due to fatigue This visit occurred during the SARS-CoV-2 public health emergency.  Safety protocols were in place, including screening questions prior to the visit, additional usage of staff PPE, and extensive cleaning of exam room while observing appropriate contact time as indicated for disinfecting solutions.   "I feel very tired" Takes a nap ~4 days a week--- 30-45 minutes (afternoon). 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 acetaminophen (TYLENOL) 500 MG tablet Take 500 mg by mouth every 8 (eight) hours as needed (for pain).     Marland Kitchen alfuzosin (UROXATRAL) 10 MG 24 hr tablet Take 1 tablet (10 mg total) by mouth daily. 90 tablet 3  . ALPRAZolam (XANAX) 0.25 MG tablet TAKE ONE TABLET BY MOUTH TWICE A DAY AS NEEDED FOR ANXIETY 20 tablet 0  . aspirin 81 MG tablet Take 81 mg by mouth every other day.    . Black Pepper-Turmeric (TURMERIC COMPLEX/BLACK PEPPER PO) Take by mouth.    . diclofenac (VOLTAREN) 75 MG EC tablet TAKE 1 TABLET BY MOUTH TWICE A DAY 180 tablet 1  . Docusate Calcium (STOOL SOFTENER PO) Take by mouth.    . doxazosin  (CARDURA) 2 MG tablet Take 2 mg by mouth daily.    Marland Kitchen esomeprazole (NEXIUM) 40 MG capsule TAKE ONE CAPSULE BY MOUTH DAILY BEFORE BREAKFAST 90 capsule 3  . finasteride (PROSCAR) 5 MG tablet Take 1 tablet (5 mg total) by mouth daily. 90 tablet 3  . loratadine (CLARITIN) 10 MG tablet Take 10 mg by mouth daily.    Marland Kitchen OVER THE COUNTER MEDICATION Deep Blue Polyphenol    . traMADol (ULTRAM) 50 MG tablet TAKE 1 TABLET BY MOUTH THREE TIMES A DAY AS NEEDED 90 tablet 0  . fluticasone (FLONASE) 50 MCG/ACT nasal spray Place 1 spray into both nostrils daily. 16 g 1  . pravastatin (PRAVACHOL) 40 MG tablet Take 1 tablet (40 mg total) by mouth every evening. 90 tablet 3   No current facility-administered medications on file prior to visit.    Allergies  Allergen Reactions  . Anesthetics, Amide Nausea Only    Other reaction(s): Vomiting After surgery patient has problems with nausea and vomiting due to anesthetics  . Other Nausea Only and Nausea And Vomiting    After surgery patient has problems with nausea and vomiting due to anesthetics  . Atorvastatin     REACTION: myalgias  . Oxycodone Nausea And Vomiting  . Simvastatin     REACTION: joint and stomach pain  . Latex Rash    Local area  (gloves - when worn)  . Statins Other (  See Comments)    Joint pain    Past Medical History:  Diagnosis Date  . Allergy   . Anxiety   . Aortic stenosis   . Barrett's esophagus   . BPH (benign prostatic hypertrophy)   . Diaphragm dysfunction    right frozen diaphragm  . Diverticulitis   . GERD (gastroesophageal reflux disease)   . Hiatal hernia   . Hx of adenomatous colonic polyps   . Hyperlipidemia   . Kidney stones   . Obstructive sleep apnea    BiPAP  . Osteoarthritis   . Osteoporosis   . PONV (postoperative nausea and vomiting)   . Spinal stenosis     Past Surgical History:  Procedure Laterality Date  . CATARACT EXTRACTION W/PHACO Right 04/20/2020   Procedure: CATARACT EXTRACTION PHACO AND  INTRAOCULAR LENS PLACEMENT ([place]) RIGHT;  Surgeon: Eulogio Bear, MD;  Location: [place];  Service: Ophthalmology;  Laterality: Right;  5.21 0:38.6  . CATARACT EXTRACTION W/PHACO Left 05/11/2020   Procedure: CATARACT EXTRACTION PHACO AND INTRAOCULAR LENS PLACEMENT ([place]) LEFT;  Surgeon: Eulogio Bear, MD;  Location: [place];  Service: Ophthalmology;  Laterality: Left;  4.15 0:30.7  . ESOPHAGOGASTRODUODENOSCOPY     colon--6/04, Barrett's--7/05, Barrett's but no cancer--4/08  . EYE MUSCLE SURGERY     L eye muscle surgery--2/01  . JOINT REPLACEMENT  1/12   left TKR  . KNEE ARTHROSCOPY     7/ 09 Arthroscopy left knee--Dr Noemi Chapel  . LIPOMA EXCISION Left 2010   compressing nerve by spine---[facility] Terald Sleeper [facility]  . ROTATOR CUFF REPAIR     R rotator cuff repair--1/00  Noemi Chapel)  . ROTATOR CUFF REPAIR Left ~2005 & 11/15   biceps repair also in 2015  . TENDON REPAIR     Left knee tendon repair--1966  . TOTAL KNEE ARTHROPLASTY Right 4/16  . VASECTOMY     Vasectomy/spermatocele/?testicular cyst--1970    Family History  Problem Relation Age of Onset  . Hypertension Mother   . Breast cancer Mother   . Colon polyps Mother   . Coronary artery disease Mother   . Diabetes Neg Hx   . Colon cancer Neg Hx   . Esophageal cancer Neg Hx   . Gallbladder disease Neg Hx   . Kidney disease Neg Hx     Social History   Socioeconomic History  . Marital status: Married    Spouse name: Not on file  . Number of children: 3  . Years of education: Not on file  . Highest education level: Not on file  Occupational History  . Occupation: retired    Fish farm manager: RETIRED  Tobacco Use  . Smoking status: Never Smoker  . Smokeless tobacco: Never Used  Vaping Use  . Vaping Use: Never used  Substance and Sexual Activity  . Alcohol use: Not Currently    Alcohol/week: 0.0 standard drinks    Comment: occassional wine  . Drug use: No  . Sexual activity: Not on file  Other Topics  Concern  . Not on file  Social History Narrative   Lives at home with wife      Has living will   Wife is health care POA---alternate daughter [name]   Would accept resuscitation attempts   No tube feeds if cognitively unaware         Social Determinants of Health   Financial Resource Strain: Not on file  Food Insecurity: Not on file  Transportation Needs: Not on file  Physical Activity: Not  on file  Stress: Not on file  Social Connections: Not on file  Intimate Partner Violence: Not on file   Review of Systems Appetite is okay---"but I don't eat like I used to" Just started back to gym in the past week No chest pain or SOB. Exercise tolerance is pretty good. No dizziness or syncope No edema No indigestion or stomach trouble Weight is stable     Objective:   Physical Exam Constitutional:      Appearance: Normal appearance.  Cardiovascular:     Rate and Rhythm: Normal rate and regular rhythm.     Pulses: Normal pulses.     Heart sounds: No murmur heard. No gallop.   Pulmonary:     Effort: Pulmonary effort is normal.     Breath sounds: Normal breath sounds. No wheezing or rales.  Abdominal:     Palpations: Abdomen is soft.     Tenderness: There is no abdominal tenderness.  Musculoskeletal:     Cervical back: Neck supple.     Right lower leg: No edema.     Left lower leg: No edema.  Lymphadenopathy:     Cervical: No cervical adenopathy.  Skin:    General: Skin is warm.     Findings: No rash.  Neurological:     General: No focal deficit present.     Mental Status: He is alert and oriented to person, place, and time.  Psychiatric:        Mood and Affect: Mood normal.        Behavior: Behavior normal.            Assessment & Plan:

## 2020-07-21 NOTE — Assessment & Plan Note (Signed)
Not depressed Does use xanax at bedtime

## 2020-07-22 ENCOUNTER — Other Ambulatory Visit: Payer: Self-pay | Admitting: Internal Medicine

## 2020-07-22 DIAGNOSIS — E538 Deficiency of other specified B group vitamins: Secondary | ICD-10-CM

## 2020-07-28 DIAGNOSIS — J392 Other diseases of pharynx: Secondary | ICD-10-CM | POA: Diagnosis not present

## 2020-07-28 DIAGNOSIS — G4733 Obstructive sleep apnea (adult) (pediatric): Secondary | ICD-10-CM | POA: Diagnosis not present

## 2020-07-29 ENCOUNTER — Other Ambulatory Visit: Payer: Self-pay | Admitting: Internal Medicine

## 2020-07-29 NOTE — Telephone Encounter (Signed)
Last filled 05-29-20 #20 Last OV 07-21-20 Next OV 02-24-21 [name]

## 2020-08-25 ENCOUNTER — Other Ambulatory Visit (INDEPENDENT_AMBULATORY_CARE_PROVIDER_SITE_OTHER): Payer: Medicare Other

## 2020-08-25 DIAGNOSIS — E538 Deficiency of other specified B group vitamins: Secondary | ICD-10-CM

## 2020-08-25 LAB — VITAMIN B12: Vitamin B-12: 255 pg/mL (ref 211–911)

## 2020-08-26 DIAGNOSIS — M79674 Pain in right toe(s): Secondary | ICD-10-CM | POA: Diagnosis not present

## 2020-08-26 DIAGNOSIS — B351 Tinea unguium: Secondary | ICD-10-CM | POA: Diagnosis not present

## 2020-08-26 DIAGNOSIS — M79675 Pain in left toe(s): Secondary | ICD-10-CM | POA: Diagnosis not present

## 2020-08-26 DIAGNOSIS — L600 Ingrowing nail: Secondary | ICD-10-CM | POA: Diagnosis not present

## 2020-08-27 ENCOUNTER — Other Ambulatory Visit: Payer: Self-pay

## 2020-08-27 ENCOUNTER — Encounter: Payer: Self-pay | Admitting: Internal Medicine

## 2020-08-27 ENCOUNTER — Ambulatory Visit (INDEPENDENT_AMBULATORY_CARE_PROVIDER_SITE_OTHER): Payer: Medicare Other | Admitting: Internal Medicine

## 2020-08-27 VITALS — BP 122/78 | HR 77 | Temp 97.5°F | Ht 66.0 in | Wt 217.0 lb

## 2020-08-27 DIAGNOSIS — M19012 Primary osteoarthritis, left shoulder: Secondary | ICD-10-CM | POA: Diagnosis not present

## 2020-08-27 DIAGNOSIS — G894 Chronic pain syndrome: Secondary | ICD-10-CM | POA: Diagnosis not present

## 2020-08-27 DIAGNOSIS — M19011 Primary osteoarthritis, right shoulder: Secondary | ICD-10-CM | POA: Insufficient documentation

## 2020-08-27 DIAGNOSIS — R5383 Other fatigue: Secondary | ICD-10-CM

## 2020-08-27 DIAGNOSIS — G8929 Other chronic pain: Secondary | ICD-10-CM | POA: Insufficient documentation

## 2020-08-27 MED ORDER — TRIAMCINOLONE ACETONIDE 40 MG/ML IJ SUSP
40.0000 mg | Freq: Once | INTRAMUSCULAR | Status: AC
  Administered 2020-08-27: 40 mg via INTRAMUSCULAR

## 2020-08-27 NOTE — Progress Notes (Signed)
Subjective:    Patient ID: [name], male    DOB: 1940/11/16, 80 y.o.   MRN: [id number]  HPI Here due to bad pain  This visit occurred during the SARS-CoV-2 public health emergency.  Safety protocols were in place, including screening questions prior to the visit, additional usage of staff PPE, and extensive cleaning of exam room while observing appropriate contact time as indicated for disinfecting solutions.   Bad pain over left shoulder, neck on right side and some in right shoulder Did join [employer]---trying to be careful with the exercises he is doing Gets heavy chair massage--thinks that helps  Martin Majestic to friend who is a Restaurant manager, fast food  Did try adjustment but told him he has arthritis in neck and he needs other Rx  Working on the sciatic nerve "I have made some good inroads" Tried using rollator walker in house and some outside----it takes the pressure off the sciatic nerve  Wondering about the B12 Asks for injection  Has had bad fatigue Comes home in afternoon and may sleep for 2 hours (despite sleeping well at night) Then awakens feeling well----and can go (like mowing the lawn,etc)  Current Outpatient Medications on File Prior to Visit  Medication Sig Dispense Refill  . acetaminophen (TYLENOL) 500 MG tablet Take 500 mg by mouth every 8 (eight) hours as needed (for pain).     Marland Kitchen alfuzosin (UROXATRAL) 10 MG 24 hr tablet Take 1 tablet (10 mg total) by mouth daily. 90 tablet 3  . ALPRAZolam (XANAX) 0.25 MG tablet TAKE ONE TABLET BY MOUTH TWICE A DAY AS NEEDED FOR ANXIETY 20 tablet 0  . aspirin 81 MG tablet Take 81 mg by mouth every other day.    . Black Pepper-Turmeric (TURMERIC COMPLEX/BLACK PEPPER PO) Take by mouth.    . diclofenac (VOLTAREN) 75 MG EC tablet TAKE 1 TABLET BY MOUTH TWICE A DAY 180 tablet 1  . Docusate Calcium (STOOL SOFTENER PO) Take by mouth.    . doxazosin (CARDURA) 2 MG tablet Take 2 mg by mouth daily.    Marland Kitchen esomeprazole (NEXIUM) 40 MG capsule  TAKE ONE CAPSULE BY MOUTH DAILY BEFORE BREAKFAST 90 capsule 3  . finasteride (PROSCAR) 5 MG tablet Take 1 tablet (5 mg total) by mouth daily. 90 tablet 3  . loratadine (CLARITIN) 10 MG tablet Take 10 mg by mouth daily.    Marland Kitchen OVER THE COUNTER MEDICATION Deep Blue Polyphenol    . traMADol (ULTRAM) 50 MG tablet TAKE 1 TABLET BY MOUTH THREE TIMES A DAY AS NEEDED 90 tablet 0  . pravastatin (PRAVACHOL) 40 MG tablet Take 1 tablet (40 mg total) by mouth every evening. 90 tablet 3   No current facility-administered medications on file prior to visit.    Allergies  Allergen Reactions  . Anesthetics, Amide Nausea Only    Other reaction(s): Vomiting After surgery patient has problems with nausea and vomiting due to anesthetics  . Other Nausea Only and Nausea And Vomiting    After surgery patient has problems with nausea and vomiting due to anesthetics  . Atorvastatin     REACTION: myalgias  . Oxycodone Nausea And Vomiting  . Simvastatin     REACTION: joint and stomach pain  . Latex Rash    Local area  (gloves - when worn)  . Statins Other (See Comments)    Joint pain    Past Medical History:  Diagnosis Date  . Allergy   . Anxiety   . Aortic stenosis   .  Barrett's esophagus   . BPH (benign prostatic hypertrophy)   . Diaphragm dysfunction    right frozen diaphragm  . Diverticulitis   . GERD (gastroesophageal reflux disease)   . Hiatal hernia   . Hx of adenomatous colonic polyps   . Hyperlipidemia   . Kidney stones   . Obstructive sleep apnea    BiPAP  . Osteoarthritis   . Osteoporosis   . PONV (postoperative nausea and vomiting)   . Spinal stenosis     Past Surgical History:  Procedure Laterality Date  . CATARACT EXTRACTION W/PHACO Right 04/20/2020   Procedure: CATARACT EXTRACTION PHACO AND INTRAOCULAR LENS PLACEMENT ([place]) RIGHT;  Surgeon: Eulogio Bear, MD;  Location: [facility];  Service: Ophthalmology;  Laterality: Right;  5.21 0:38.6  . CATARACT EXTRACTION  W/PHACO Left 05/11/2020   Procedure: CATARACT EXTRACTION PHACO AND INTRAOCULAR LENS PLACEMENT ([place]) LEFT;  Surgeon: Eulogio Bear, MD;  Location: [facility];  Service: Ophthalmology;  Laterality: Left;  4.15 0:30.7  . ESOPHAGOGASTRODUODENOSCOPY     colon--6/04, Barrett's--7/05, Barrett's but no cancer--4/08  . EYE MUSCLE SURGERY     L eye muscle surgery--2/01  . JOINT REPLACEMENT  1/12   left TKR  . KNEE ARTHROSCOPY     7/ 09 Arthroscopy left knee--Dr Noemi Chapel  . LIPOMA EXCISION Left 2010   compressing nerve by spine---[facility]  . ROTATOR CUFF REPAIR     R rotator cuff repair--1/00  Noemi Chapel)  . ROTATOR CUFF REPAIR Left ~2005 & 11/15   biceps repair also in 2015  . TENDON REPAIR     Left knee tendon repair--1966  . TOTAL KNEE ARTHROPLASTY Right 4/16  . VASECTOMY     Vasectomy/spermatocele/?testicular cyst--1970    Family History  Problem Relation Age of Onset  . Hypertension Mother   . Breast cancer Mother   . Colon polyps Mother   . Coronary artery disease Mother   . Diabetes Neg Hx   . Colon cancer Neg Hx   . Esophageal cancer Neg Hx   . Gallbladder disease Neg Hx   . Kidney disease Neg Hx     Social History   Socioeconomic History  . Marital status: Married    Spouse name: Not on file  . Number of children: 3  . Years of education: Not on file  . Highest education level: Not on file  Occupational History  . Occupation: retired    Fish farm manager: RETIRED  Tobacco Use  . Smoking status: Never Smoker  . Smokeless tobacco: Never Used  Vaping Use  . Vaping Use: Never used  Substance and Sexual Activity  . Alcohol use: Not Currently    Alcohol/week: 0.0 standard drinks    Comment: occassional wine  . Drug use: No  . Sexual activity: Not on file  Other Topics Concern  . Not on file  Social History Narrative   Lives at home with wife      Has living will   Wife is health care POA---alternate daughter [name]   Would accept resuscitation attempts    No tube feeds if cognitively unaware         Social Determinants of Health   Financial Resource Strain: Not on file  Food Insecurity: Not on file  Transportation Needs: Not on file  Physical Activity: Not on file  Stress: Not on file  Social Connections: Not on file  Intimate Partner Violence: Not on file   Review of Systems Tylenol does give some help Down  to 1 tramadol a day---mostly at night    Objective:   Physical Exam Constitutional:      Appearance: Normal appearance.  Neck:     Comments: Restriction in all spheres--especially extension Musculoskeletal:     Comments: Pain with movement in both shoulders  Neurological:     Mental Status: He is alert.  Psychiatric:     Comments: Very upset about his limitations            Assessment & Plan:

## 2020-08-27 NOTE — Assessment & Plan Note (Signed)
Multifaceted---OA, sciatic, spinal stenosis, etc He is very frustrated Discussed options--will refer to physiatry

## 2020-08-27 NOTE — Addendum Note (Signed)
Addended by: Pilar Grammes on: 08/27/2020 02:33 PM   Modules accepted: Orders

## 2020-08-27 NOTE — Assessment & Plan Note (Signed)
He is very upset about needing a 2 hour afternoon nap in addition to 7 hours sleep at night He awakens refreshed though! I told him he should work on a change in mind set

## 2020-08-27 NOTE — Assessment & Plan Note (Signed)
Having marked pain now Discussed alternatives He gave verbal consent  PROCEDURE Sterile prep Ethyl chloride 2cc 1% lidocaine 40mg  kenalog/6cc 1% lidocaine injected into posterior left shoulder Tolerated well with some decrease in pain Discussed home care

## 2020-08-31 DIAGNOSIS — H6123 Impacted cerumen, bilateral: Secondary | ICD-10-CM | POA: Diagnosis not present

## 2020-08-31 DIAGNOSIS — J392 Other diseases of pharynx: Secondary | ICD-10-CM | POA: Diagnosis not present

## 2020-08-31 DIAGNOSIS — H903 Sensorineural hearing loss, bilateral: Secondary | ICD-10-CM | POA: Diagnosis not present

## 2020-09-11 ENCOUNTER — Encounter: Payer: Self-pay | Admitting: Internal Medicine

## 2020-09-11 ENCOUNTER — Other Ambulatory Visit: Payer: Self-pay

## 2020-09-11 ENCOUNTER — Ambulatory Visit (INDEPENDENT_AMBULATORY_CARE_PROVIDER_SITE_OTHER): Payer: Medicare Other | Admitting: Internal Medicine

## 2020-09-11 VITALS — BP 126/68 | HR 72 | Temp 97.6°F | Ht 66.0 in | Wt 213.0 lb

## 2020-09-11 DIAGNOSIS — M542 Cervicalgia: Secondary | ICD-10-CM

## 2020-09-11 MED ORDER — ALPRAZOLAM 0.25 MG PO TABS: 0.2500 mg | ORAL_TABLET | Freq: Every evening | ORAL | 0 refills | Status: DC | PRN

## 2020-09-11 NOTE — Patient Instructions (Signed)
You can take the alprazolam nightly as needed (and then hopefully you won't need the tramadol). You should continue the oral diclofenac twice a day.

## 2020-09-11 NOTE — Assessment & Plan Note (Signed)
Seems mostly muscular---along trapezius course Has tried heat/massage Pain may be worse due to stopping diclofenac--has normal renal function and is on PPI----okay to take Not sure tramadol does any good Xanax at night seems to help him sleep more

## 2020-09-11 NOTE — Progress Notes (Signed)
Subjective:    Patient ID: [name], male    DOB: [birth year], 80 y.o.   MRN: [id number]  HPI Here due to neck pain This visit occurred during the SARS-CoV-2 public health emergency.  Safety protocols were in place, including screening questions prior to the visit, additional usage of staff PPE, and extensive cleaning of exam room while observing appropriate contact time as indicated for disinfecting solutions.   Does have appt with physiatrist at [facility] next week The shoulder is better since the injection  Now with bilateral posterior neck pain---up to base of skull Very limited in rotation to left--has to turn whole body  Hasn't gone back to the chiropractor Deep blue topical helps some---"but this is deeper" On diclofenac pills--but trying to come off it Is down to just one tramadol at bedtime--trying to cut that back also Taking melatonin at night  Current Outpatient Medications on File Prior to Visit  Medication Sig Dispense Refill  . acetaminophen (TYLENOL) 500 MG tablet Take 500 mg by mouth every 8 (eight) hours as needed (for pain).     Marland Kitchen alfuzosin (UROXATRAL) 10 MG 24 hr tablet Take 1 tablet (10 mg total) by mouth daily. 90 tablet 3  . ALPRAZolam (XANAX) 0.25 MG tablet TAKE ONE TABLET BY MOUTH TWICE A DAY AS NEEDED FOR ANXIETY 20 tablet 0  . aspirin 81 MG tablet Take 81 mg by mouth every other day.    . Black Pepper-Turmeric (TURMERIC COMPLEX/BLACK PEPPER PO) Take by mouth.    . diclofenac (VOLTAREN) 75 MG EC tablet TAKE 1 TABLET BY MOUTH TWICE A DAY 180 tablet 1  . Docusate Calcium (STOOL SOFTENER PO) Take by mouth.    . doxazosin (CARDURA) 2 MG tablet Take 2 mg by mouth daily.    Marland Kitchen esomeprazole (NEXIUM) 40 MG capsule TAKE ONE CAPSULE BY MOUTH DAILY BEFORE BREAKFAST 90 capsule 3  . finasteride (PROSCAR) 5 MG tablet Take 1 tablet (5 mg total) by mouth daily. 90 tablet 3  . loratadine (CLARITIN) 10 MG tablet Take 10 mg by mouth daily.    Marland Kitchen OVER THE COUNTER MEDICATION Deep  Blue Polyphenol    . traMADol (ULTRAM) 50 MG tablet TAKE 1 TABLET BY MOUTH THREE TIMES A DAY AS NEEDED 90 tablet 0  . pravastatin (PRAVACHOL) 40 MG tablet Take 1 tablet (40 mg total) by mouth every evening. 90 tablet 3   No current facility-administered medications on file prior to visit.    Allergies  Allergen Reactions  . Anesthetics, Amide Nausea Only    Other reaction(s): Vomiting After surgery patient has problems with nausea and vomiting due to anesthetics  . Other Nausea Only and Nausea And Vomiting    After surgery patient has problems with nausea and vomiting due to anesthetics  . Atorvastatin     REACTION: myalgias  . Oxycodone Nausea And Vomiting  . Simvastatin     REACTION: joint and stomach pain  . Latex Rash    Local area  (gloves - when worn)  . Statins Other (See Comments)    Joint pain    Past Medical History:  Diagnosis Date  . Allergy   . Anxiety   . Aortic stenosis   . Barrett's esophagus   . BPH (benign prostatic hypertrophy)   . Diaphragm dysfunction    right frozen diaphragm  . Diverticulitis   . GERD (gastroesophageal reflux disease)   . Hiatal hernia   . Hx of adenomatous colonic polyps   . Hyperlipidemia   .  Kidney stones   . Obstructive sleep apnea    BiPAP  . Osteoarthritis   . Osteoporosis   . PONV (postoperative nausea and vomiting)   . Spinal stenosis     Past Surgical History:  Procedure Laterality Date  . CATARACT EXTRACTION W/PHACO Right 04/20/2020   Procedure: CATARACT EXTRACTION PHACO AND INTRAOCULAR LENS PLACEMENT ([place]) RIGHT;  Surgeon: Eulogio Bear, MD;  Location: [facility] [place];  Service: Ophthalmology;  Laterality: Right;  5.21 0:38.6  . CATARACT EXTRACTION W/PHACO Left 05/11/2020   Procedure: CATARACT EXTRACTION PHACO AND INTRAOCULAR LENS PLACEMENT ([place]) LEFT;  Surgeon: Eulogio Bear, MD;  Location: [facility];  Service: Ophthalmology;  Laterality: Left;  4.15 0:30.7  . ESOPHAGOGASTRODUODENOSCOPY      colon--6/04, Barrett's--7/05, Barrett's but no cancer--4/08  . EYE MUSCLE SURGERY     L eye muscle surgery--2/01  . JOINT REPLACEMENT  1/12   left TKR  . KNEE ARTHROSCOPY     7/ 09 Arthroscopy left knee--Dr Noemi Chapel  . LIPOMA EXCISION Left 2010   compressing nerve by spine---Dr Terald Sleeper at [facility]  . ROTATOR CUFF REPAIR     R rotator cuff repair--1/00  Noemi Chapel)  . ROTATOR CUFF REPAIR Left ~2005 & 11/15   biceps repair also in 2015  . TENDON REPAIR     Left knee tendon repair--1966  . TOTAL KNEE ARTHROPLASTY Right 4/16  . VASECTOMY     Vasectomy/spermatocele/?testicular cyst--1970    Family History  Problem Relation Age of Onset  . Hypertension Mother   . Breast cancer Mother   . Colon polyps Mother   . Coronary artery disease Mother   . Diabetes Neg Hx   . Colon cancer Neg Hx   . Esophageal cancer Neg Hx   . Gallbladder disease Neg Hx   . Kidney disease Neg Hx     Social History   Socioeconomic History  . Marital status: Married    Spouse name: Not on file  . Number of children: 3  . Years of education: Not on file  . Highest education level: Not on file  Occupational History  . Occupation: retired    Fish farm manager: RETIRED  Tobacco Use  . Smoking status: Never Smoker  . Smokeless tobacco: Never Used  Vaping Use  . Vaping Use: Never used  Substance and Sexual Activity  . Alcohol use: Not Currently    Alcohol/week: 0.0 standard drinks    Comment: occassional wine  . Drug use: No  . Sexual activity: Not on file  Other Topics Concern  . Not on file  Social History Narrative   Lives at home with wife      Has living will   Wife is health care POA---alternate daughter [name]   Would accept resuscitation attempts   No tube feeds if cognitively unaware         Social Determinants of Health   Financial Resource Strain: Not on file  Food Insecurity: Not on file  Transportation Needs: Not on file  Physical Activity: Not on file  Stress: Not on file  Social  Connections: Not on file  Intimate Partner Violence: Not on file   Review of Systems  No arm weakness No radiation to arms     Objective:   Physical Exam Neck:     Comments: Marked restriction in neck ROM---very little rotation or tilt to the left Mildly restricted flexion/extension Neurological:     Comments: No arm weakness  Assessment & Plan:

## 2020-09-14 ENCOUNTER — Other Ambulatory Visit: Payer: Self-pay | Admitting: Internal Medicine

## 2020-09-18 ENCOUNTER — Other Ambulatory Visit: Payer: Self-pay | Admitting: Family Medicine

## 2020-09-18 DIAGNOSIS — M5416 Radiculopathy, lumbar region: Secondary | ICD-10-CM | POA: Diagnosis not present

## 2020-09-18 DIAGNOSIS — G8929 Other chronic pain: Secondary | ICD-10-CM | POA: Diagnosis not present

## 2020-09-18 DIAGNOSIS — M5442 Lumbago with sciatica, left side: Secondary | ICD-10-CM | POA: Diagnosis not present

## 2020-09-18 DIAGNOSIS — M5136 Other intervertebral disc degeneration, lumbar region: Secondary | ICD-10-CM | POA: Diagnosis not present

## 2020-09-21 DIAGNOSIS — M545 Low back pain, unspecified: Secondary | ICD-10-CM | POA: Diagnosis not present

## 2020-09-23 ENCOUNTER — Other Ambulatory Visit: Payer: Self-pay | Admitting: Internal Medicine

## 2020-09-24 NOTE — Telephone Encounter (Signed)
Last filled 07-15-20 #90 Last OV 08-27-20 Next OV 02-24-21 [facility]

## 2020-09-30 ENCOUNTER — Ambulatory Visit: Payer: Medicare Other

## 2020-10-07 ENCOUNTER — Ambulatory Visit
Admission: RE | Admit: 2020-10-07 | Discharge: 2020-10-07 | Disposition: A | Discharge status: Home / Self Care | Payer: Medicare Other | Source: Ambulatory Visit | Attending: Family Medicine | Admitting: Family Medicine

## 2020-10-07 ENCOUNTER — Other Ambulatory Visit: Payer: Self-pay

## 2020-10-07 DIAGNOSIS — M5416 Radiculopathy, lumbar region: Secondary | ICD-10-CM

## 2020-10-07 DIAGNOSIS — M4805 Spinal stenosis, thoracolumbar region: Secondary | ICD-10-CM | POA: Diagnosis not present

## 2020-10-07 DIAGNOSIS — M5116 Intervertebral disc disorders with radiculopathy, lumbar region: Secondary | ICD-10-CM | POA: Diagnosis not present

## 2020-10-07 DIAGNOSIS — M4726 Other spondylosis with radiculopathy, lumbar region: Secondary | ICD-10-CM | POA: Diagnosis not present

## 2020-10-07 DIAGNOSIS — M48061 Spinal stenosis, lumbar region without neurogenic claudication: Secondary | ICD-10-CM | POA: Diagnosis not present

## 2020-10-09 DIAGNOSIS — M48062 Spinal stenosis, lumbar region with neurogenic claudication: Secondary | ICD-10-CM | POA: Diagnosis not present

## 2020-10-09 DIAGNOSIS — M5416 Radiculopathy, lumbar region: Secondary | ICD-10-CM | POA: Diagnosis not present

## 2020-10-09 DIAGNOSIS — M5136 Other intervertebral disc degeneration, lumbar region: Secondary | ICD-10-CM | POA: Diagnosis not present

## 2020-10-15 DIAGNOSIS — K120 Recurrent oral aphthae: Secondary | ICD-10-CM | POA: Diagnosis not present

## 2020-10-15 DIAGNOSIS — H6121 Impacted cerumen, right ear: Secondary | ICD-10-CM | POA: Diagnosis not present

## 2020-10-19 DIAGNOSIS — M47816 Spondylosis without myelopathy or radiculopathy, lumbar region: Secondary | ICD-10-CM | POA: Diagnosis not present

## 2020-11-03 DIAGNOSIS — M47816 Spondylosis without myelopathy or radiculopathy, lumbar region: Secondary | ICD-10-CM | POA: Diagnosis not present

## 2020-11-11 ENCOUNTER — Other Ambulatory Visit: Payer: Self-pay | Admitting: Internal Medicine

## 2020-11-11 DIAGNOSIS — M47816 Spondylosis without myelopathy or radiculopathy, lumbar region: Secondary | ICD-10-CM | POA: Diagnosis not present

## 2020-11-11 DIAGNOSIS — M48062 Spinal stenosis, lumbar region with neurogenic claudication: Secondary | ICD-10-CM | POA: Diagnosis not present

## 2020-11-11 DIAGNOSIS — M5416 Radiculopathy, lumbar region: Secondary | ICD-10-CM | POA: Diagnosis not present

## 2020-11-11 DIAGNOSIS — M5136 Other intervertebral disc degeneration, lumbar region: Secondary | ICD-10-CM | POA: Diagnosis not present

## 2020-11-12 NOTE — Telephone Encounter (Signed)
Last filled 09-11-20 #30 Last OV 09-11-20 Next OV 02-24-21 [facility]

## 2020-12-09 DIAGNOSIS — M47816 Spondylosis without myelopathy or radiculopathy, lumbar region: Secondary | ICD-10-CM | POA: Diagnosis not present

## 2020-12-15 DIAGNOSIS — M6281 Muscle weakness (generalized): Secondary | ICD-10-CM | POA: Diagnosis not present

## 2020-12-15 DIAGNOSIS — M545 Low back pain, unspecified: Secondary | ICD-10-CM | POA: Diagnosis not present

## 2020-12-18 DIAGNOSIS — M545 Low back pain, unspecified: Secondary | ICD-10-CM | POA: Diagnosis not present

## 2020-12-18 DIAGNOSIS — M6281 Muscle weakness (generalized): Secondary | ICD-10-CM | POA: Diagnosis not present

## 2020-12-21 DIAGNOSIS — M545 Low back pain, unspecified: Secondary | ICD-10-CM | POA: Diagnosis not present

## 2020-12-21 DIAGNOSIS — M6281 Muscle weakness (generalized): Secondary | ICD-10-CM | POA: Diagnosis not present

## 2020-12-23 DIAGNOSIS — M6281 Muscle weakness (generalized): Secondary | ICD-10-CM | POA: Diagnosis not present

## 2020-12-23 DIAGNOSIS — D1801 Hemangioma of skin and subcutaneous tissue: Secondary | ICD-10-CM | POA: Diagnosis not present

## 2020-12-23 DIAGNOSIS — L720 Epidermal cyst: Secondary | ICD-10-CM | POA: Diagnosis not present

## 2020-12-23 DIAGNOSIS — M545 Low back pain, unspecified: Secondary | ICD-10-CM | POA: Diagnosis not present

## 2020-12-23 DIAGNOSIS — L821 Other seborrheic keratosis: Secondary | ICD-10-CM | POA: Diagnosis not present

## 2020-12-23 DIAGNOSIS — D225 Melanocytic nevi of trunk: Secondary | ICD-10-CM | POA: Diagnosis not present

## 2020-12-23 DIAGNOSIS — L814 Other melanin hyperpigmentation: Secondary | ICD-10-CM | POA: Diagnosis not present

## 2020-12-24 DIAGNOSIS — M25511 Pain in right shoulder: Secondary | ICD-10-CM | POA: Diagnosis not present

## 2020-12-24 DIAGNOSIS — W1840XA Slipping, tripping and stumbling without falling, unspecified, initial encounter: Secondary | ICD-10-CM | POA: Diagnosis not present

## 2020-12-24 DIAGNOSIS — S46011A Strain of muscle(s) and tendon(s) of the rotator cuff of right shoulder, initial encounter: Secondary | ICD-10-CM | POA: Diagnosis not present

## 2020-12-28 DIAGNOSIS — M545 Low back pain, unspecified: Secondary | ICD-10-CM | POA: Diagnosis not present

## 2020-12-28 DIAGNOSIS — M6281 Muscle weakness (generalized): Secondary | ICD-10-CM | POA: Diagnosis not present

## 2021-01-05 DIAGNOSIS — M25511 Pain in right shoulder: Secondary | ICD-10-CM | POA: Diagnosis not present

## 2021-01-05 DIAGNOSIS — M6281 Muscle weakness (generalized): Secondary | ICD-10-CM | POA: Diagnosis not present

## 2021-01-12 DIAGNOSIS — M25511 Pain in right shoulder: Secondary | ICD-10-CM | POA: Diagnosis not present

## 2021-01-12 DIAGNOSIS — M6281 Muscle weakness (generalized): Secondary | ICD-10-CM | POA: Diagnosis not present

## 2021-01-13 DIAGNOSIS — M48062 Spinal stenosis, lumbar region with neurogenic claudication: Secondary | ICD-10-CM | POA: Diagnosis not present

## 2021-01-13 DIAGNOSIS — M5416 Radiculopathy, lumbar region: Secondary | ICD-10-CM | POA: Diagnosis not present

## 2021-01-13 DIAGNOSIS — M5136 Other intervertebral disc degeneration, lumbar region: Secondary | ICD-10-CM | POA: Diagnosis not present

## 2021-01-13 DIAGNOSIS — M47816 Spondylosis without myelopathy or radiculopathy, lumbar region: Secondary | ICD-10-CM | POA: Diagnosis not present

## 2021-01-14 DIAGNOSIS — M25511 Pain in right shoulder: Secondary | ICD-10-CM | POA: Diagnosis not present

## 2021-01-14 DIAGNOSIS — M6281 Muscle weakness (generalized): Secondary | ICD-10-CM | POA: Diagnosis not present

## 2021-01-18 DIAGNOSIS — M25511 Pain in right shoulder: Secondary | ICD-10-CM | POA: Diagnosis not present

## 2021-01-18 DIAGNOSIS — M6281 Muscle weakness (generalized): Secondary | ICD-10-CM | POA: Diagnosis not present

## 2021-01-20 ENCOUNTER — Other Ambulatory Visit: Payer: Self-pay | Admitting: Internal Medicine

## 2021-01-20 ENCOUNTER — Telehealth: Payer: Self-pay

## 2021-01-20 DIAGNOSIS — M25511 Pain in right shoulder: Secondary | ICD-10-CM | POA: Diagnosis not present

## 2021-01-20 DIAGNOSIS — M6281 Muscle weakness (generalized): Secondary | ICD-10-CM | POA: Diagnosis not present

## 2021-01-20 NOTE — Telephone Encounter (Signed)
I spoke with pt; pt said he has been having more difficulty breathing the last 2 - 3 wks; pt does not have CP, H/A, dizziness ,cough or fever. Pt also said for about 1 wk pt has had burning and pain upon urination with frequency and urgency but pt said the urgency is not new. Pt has low back pain on lt side (pt  usually has back pain but the lt lower back pain is more painful than usual. Pt is not having abd pain but does feel bloated. Offered pt appt with a different provider and pt only wants to see Dr Silvio Pate. Pt does not want to go to an UC either. Pt said he has been seeing Dr Silvio Pate for 20 years and pt would like to see Dr Silvio Pate this week because pt is going to be out of town all next wk. I explained to pt that Dr Silvio Pate is not in office on 01/22/21 and Dr Silvio Pate is only here this week on 01/21/21 in afternoon but no available appts left for Dr Silvio Pate. Pt request note sent to Dr Silvio Pate to see if Dr Silvio Pate would see pt since pt is going to be out of town next wk. UC & ED precautions given an patient voiced understanding. Sending note to DR Silvio Pate who is out of office and Dr Glori Bickers who is in office and [facility] CMA; also sending to [place] on teams.

## 2021-01-20 NOTE — Telephone Encounter (Signed)
Last filled 11-12-20 #30 Last OV 09-11-20 Next OV 02-24-21 [facility]

## 2021-01-20 NOTE — Telephone Encounter (Signed)
[place] Day - Client TELEPHONE ADVICE RECORD [facility] Patient Name: [name] [facility] Gender: Male DOB: 1941-01-12 Age: 80 Y 21 M 30 D Return Phone Number: [phone] (Primary) Address: City/ State/ Zip: [place] [address] Client [facility] Day - Client Client Site [place] - Day Physician Viviana Simpler- MD Contact Type Call Who Is Calling Patient / Member / Family / Caregiver Call Type Triage / Clinical Relationship To Patient Self Return Phone Number [phone] (Primary) Chief Complaint BREATHING - shortness of breath or sounds breathless Reason for Call Symptomatic / Request for [place] states that she is transferring a patient over for triage due to the patient having difficulty breathing. Translation No Nurse Assessment Nurse: Hassell Done, RN, Melanie Date/Time (Eastern Time): 01/20/2021 2:13:13 PM Confirm and document reason for call. If symptomatic, describe symptoms. 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 Pt called to see Dr Silvio Pate. He is breathing heavier. Has noticed the harder breathing for 2-3 weeks. Does the patient have any new or worsening symptoms? ---Yes Will a triage be completed? ---Yes Related visit to physician within the last 2 weeks? ---No Does the PT have any chronic conditions? (i.e. diabetes, asthma, this includes High risk factors for pregnancy, etc.) ---Yes List chronic conditions. ---Caller has 1 diaphragm. States he is sorry but he has to go in to another appointment. this is after saying he is not in any emergency situation, has had increased difficulty over 2-3 weeks and that he wanted to see Dr Silvio Pate and get referral to [facility]. Is this a behavioral health or substance abuse call? ---No Disp. Time Eilene Ghazi Time) Disposition Final  User 01/20/2021 2:10:58 PM Send to Urgent Queue Wynema Birch 01/20/2021 2:19:02 PM Clinical Call Yes Hassell Done, RN, Threasa Beards

## 2021-01-20 NOTE — Telephone Encounter (Signed)
Agree with that advisement-if he will only see Dr Silvio Pate then I don't have a solution , but will watch out for updates

## 2021-01-21 ENCOUNTER — Other Ambulatory Visit: Payer: Self-pay

## 2021-01-21 ENCOUNTER — Ambulatory Visit
Admission: EM | Admit: 2021-01-21 | Discharge: 2021-01-21 | Disposition: A | Discharge status: Home / Self Care | Payer: Medicare Other | Attending: Family Medicine | Admitting: Family Medicine

## 2021-01-21 ENCOUNTER — Encounter: Payer: Self-pay | Admitting: Emergency Medicine

## 2021-01-21 DIAGNOSIS — N3001 Acute cystitis with hematuria: Secondary | ICD-10-CM | POA: Diagnosis not present

## 2021-01-21 LAB — URINALYSIS, COMPLETE (UACMP) WITH MICROSCOPIC
Bilirubin Urine: NEGATIVE
Glucose, UA: NEGATIVE mg/dL
Ketones, ur: NEGATIVE mg/dL
Nitrite: NEGATIVE
Protein, ur: 100 mg/dL — AB
Specific Gravity, Urine: 1.02 (ref 1.005–1.030)
WBC, UA: >50 WBC/hpf (ref 0–5)
pH: 5.5 (ref 5.0–8.0)

## 2021-01-21 MED ORDER — CEPHALEXIN 500 MG PO CAPS: 500.0000 mg | ORAL_CAPSULE | Freq: Two times a day (BID) | ORAL | 0 refills | Status: DC

## 2021-01-21 MED ORDER — POLYETHYLENE GLYCOL 3350 17 GM/SCOOP PO POWD: ORAL | 0 refills | Status: DC

## 2021-01-21 NOTE — Telephone Encounter (Signed)
Spoke to pt. He said he was feeling better this morning. Says he thinks he has a UTI. I asked about his breathing and he said he thinks it was all tied together. He is going to the [facility] UC at [facility] today.

## 2021-01-21 NOTE — ED Provider Notes (Signed)
[facility]    CSN: [id number] Arrival date & time: 01/21/21  1015      History   Chief Complaint Chief Complaint  Patient presents with   Dysuria   Constipation    HPI 80 year old male presents with the above complaints.  Patient reports that he has had symptoms for the past 3 days.  He reports painful urination, urinary hesitancy, and decreased flow/stream.  Patient reports subjective fever.  Currently afebrile.  He also reports that he has had constipation.  Denies nausea, vomiting, diarrhea.  No documented fever.  No relieving factors.  He is concerned that he has UTI.  Past Medical History:  Diagnosis Date   Allergy    Anxiety    Aortic stenosis    Barrett's esophagus    BPH (benign prostatic hypertrophy)    Diaphragm dysfunction    right frozen diaphragm   Diverticulitis    GERD (gastroesophageal reflux disease)    Hiatal hernia    Hx of adenomatous colonic polyps    Hyperlipidemia    Kidney stones    Obstructive sleep apnea    BiPAP   Osteoarthritis    Osteoporosis    PONV (postoperative nausea and vomiting)    Spinal stenosis     Patient Active Problem List   Diagnosis Date Noted   Neck pain 09/11/2020   Chronic pain 08/27/2020   Osteoarthritis of left shoulder 08/27/2020   Chronic narcotic dependence ([place]) 02/07/2020   Nonrheumatic aortic valve insufficiency 11/10/2019   Increased urinary frequency 07/25/2019   Mood disorder ([place]) 03/06/2019   Aortic atherosclerosis ([place]) 12/25/2017   Aortic valve stenosis 11/20/2017   Ascending aortic aneurysm ([place]) 11/20/2017   Fatigue 06/27/2017   Rosacea 03/12/2015   Advance directive discussed with patient 12/15/2014   Arthritis of knee, degenerative 07/23/2014   Obstructive sleep apnea    Class 1 obesity 03/13/2014   Benign fibroma of prostate 03/13/2014   Lumbar radicular pain 09/12/2012   Osteoarthritis, hand 10/20/2011   Routine general medical examination at a health care facility  07/26/2011   Osteoarthritis, multiple sites 07/10/2009   Obesity 12/04/2008   Essential hypertension 08/13/2008   BARRETTS ESOPHAGUS 08/13/2008   BPH with obstruction/lower urinary tract symptoms 12/12/2007   SPINAL STENOSIS, LUMBAR 09/21/2007   Hyperlipemia 10/09/2006   ALLERGIC RHINITIS 10/09/2006   GERD 10/09/2006   Diverticulosis 10/09/2006    Past Surgical History:  Procedure Laterality Date   CATARACT EXTRACTION W/PHACO Right 04/20/2020   Procedure: CATARACT EXTRACTION PHACO AND INTRAOCULAR LENS PLACEMENT ([place]) RIGHT;  Surgeon: Eulogio Bear, MD;  Location: [place];  Service: Ophthalmology;  Laterality: Right;  5.21 0:38.6   CATARACT EXTRACTION W/PHACO Left 05/11/2020   Procedure: CATARACT EXTRACTION PHACO AND INTRAOCULAR LENS PLACEMENT ([place]) LEFT;  Surgeon: Eulogio Bear, MD;  Location: [place];  Service: Ophthalmology;  Laterality: Left;  4.15 0:30.7   ESOPHAGOGASTRODUODENOSCOPY     colon--6/04, Barrett's--7/05, Barrett's but no cancer--4/08   EYE MUSCLE SURGERY     L eye muscle surgery--2/01   JOINT REPLACEMENT  1/12   left TKR   KNEE ARTHROSCOPY     7/ 09 Arthroscopy left knee--Dr Noemi Chapel   LIPOMA EXCISION Left 2010   compressing nerve by spine---Dr Terald Sleeper at [facility] rotator cuff repair--1/00  Noemi Chapel)   ROTATOR CUFF REPAIR Left ~2005 & 11/15   biceps repair also in 2015   TENDON REPAIR     Left knee  tendon repair--1966   TOTAL KNEE ARTHROPLASTY Right 4/16   VASECTOMY     Vasectomy/spermatocele/?testicular cyst--1970       Home Medications    Prior to Admission medications   Medication Sig Start Date End Date Taking? Authorizing Provider  cephALEXin (KEFLEX) 500 MG capsule Take 1 capsule (500 mg total) by mouth 2 (two) times daily. 01/21/21  Yes Shandy Checo G, DO  polyethylene glycol powder (GLYCOLAX/MIRALAX) 17 GM/SCOOP powder 17 g once or twice daily for constipation. 01/21/21  Yes Nakeya Adinolfi G, DO   acetaminophen (TYLENOL) 500 MG tablet Take 500 mg by mouth every 8 (eight) hours as needed (for pain).     [provider]  alfuzosin (UROXATRAL) 10 MG 24 hr tablet Take 1 tablet (10 mg total) by mouth daily. 03/06/19   Venia Carbon, MD  ALPRAZolam Duanne Moron) 0.25 MG tablet TAKE 1 TABLET BY MOUTH AT BEDTIME AS NEEDED FOR ANXIETY. 11/12/20   Viviana Simpler I, MD  aspirin 81 MG tablet Take 81 mg by mouth every other day.    [provider]  Black Pepper-Turmeric (TURMERIC COMPLEX/BLACK PEPPER PO) Take by mouth.    [provider]  diclofenac (VOLTAREN) 75 MG EC tablet TAKE 1 TABLET BY MOUTH TWICE A DAY 05/25/20   Venia Carbon, MD  Docusate Calcium (STOOL SOFTENER PO) Take by mouth.    [provider]  doxazosin (CARDURA) 2 MG tablet Take 2 mg by mouth daily.    [provider]  esomeprazole (NEXIUM) 40 MG capsule TAKE ONE CAPSULE BY MOUTH DAILY BEFORE BREAKFAST 04/15/20   Viviana Simpler I, MD  finasteride (PROSCAR) 5 MG tablet Take 1 tablet (5 mg total) by mouth daily. 11/18/19   Venia Carbon, MD  loratadine (CLARITIN) 10 MG tablet Take 10 mg by mouth daily.    [provider]  OVER THE COUNTER MEDICATION Deep Blue Polyphenol    [provider]  pravastatin (PRAVACHOL) 40 MG tablet TAKE ONE TABLET BY MOUTH EVERY EVENING 09/14/20   Venia Carbon, MD  traMADol (ULTRAM) 50 MG tablet TAKE 1 TABLET BY MOUTH THREE TIMES A DAY AS NEEDED 09/24/20   Venia Carbon, MD    Family History Family History  Problem Relation Age of Onset   Hypertension Mother    Breast cancer Mother    Colon polyps Mother    Coronary artery disease Mother    Diabetes Neg Hx    Colon cancer Neg Hx    Esophageal cancer Neg Hx    Gallbladder disease Neg Hx    Kidney disease Neg Hx     Social History Social History   Tobacco Use   Smoking status: Never   Smokeless tobacco: Never  Vaping Use   Vaping Use: Never used  Substance Use Topics    Alcohol use: Not Currently    Alcohol/week: 0.0 standard drinks    Comment: occassional wine   Drug use: No     Allergies   Anesthetics, amide; Other; Atorvastatin; Oxycodone; Simvastatin; Latex; and Statins   Review of Systems Review of Systems Per HPI  Physical Exam Triage Vital Signs ED Triage Vitals  Enc Vitals Group     BP 01/21/21 1108 129/83     Pulse Rate 01/21/21 1108 81     Resp 01/21/21 1108 20     Temp 01/21/21 1108 98.3 F (36.8 C)     Temp Source 01/21/21 1108 Oral     SpO2 01/21/21 1108 98 %  Weight --      Height --      Head Circumference --      Peak Flow --      Pain Score 01/21/21 1109 0     Pain Loc --      Pain Edu? --      Excl. in [place]? --    Updated Vital Signs BP 129/83 (BP Location: Right Arm)   Pulse 81   Temp 98.3 F (36.8 C) (Oral)   Resp 20   SpO2 98%   Visual Acuity Right Eye Distance:   Left Eye Distance:   Bilateral Distance:    Right Eye Near:   Left Eye Near:    Bilateral Near:     Physical Exam Vitals and nursing note reviewed.  Constitutional:      General: He is not in acute distress.    Appearance: Normal appearance. He is not ill-appearing.  HENT:     Head: Normocephalic and atraumatic.  Eyes:     General:        Right eye: No discharge.        Left eye: No discharge.     Conjunctiva/sclera: Conjunctivae normal.  Cardiovascular:     Rate and Rhythm: Normal rate and regular rhythm.  Pulmonary:     Effort: Pulmonary effort is normal.     Breath sounds: Normal breath sounds.  Abdominal:     General: There is no distension.     Palpations: Abdomen is soft.     Tenderness: There is no abdominal tenderness.  Neurological:     Mental Status: He is alert.     UC Treatments / Results  Labs (all labs ordered are listed, but only abnormal results are displayed) Labs Reviewed  URINALYSIS, COMPLETE (UACMP) WITH MICROSCOPIC - Abnormal; Notable for the following components:      Result Value   APPearance  HAZY (*)    Hgb urine dipstick MODERATE (*)    Protein, ur 100 (*)    Leukocytes,Ua MODERATE (*)    Non Squamous Epithelial PRESENT (*)    Bacteria, UA MANY (*)    All other components within normal limits  URINE CULTURE    EKG   Radiology No results found.  Procedures Procedures (including critical care time)  Medications Ordered in UC Medications - No data to display  Initial Impression / Assessment and Plan / UC Course  I have reviewed the triage vital signs and the nursing notes.  Pertinent labs & imaging results that were available during my care of the patient were reviewed by me and considered in my medical decision making (see chart for details).    80 year old male presents with UTI.  He is also experiencing constipation.  Keflex as prescribed.  MiraLAX as directed.  Supportive care.  Final Clinical Impressions(s) / UC Diagnoses   Final diagnoses:  Acute cystitis with hematuria   Discharge Instructions   None    ED Prescriptions     Medication Sig Dispense Auth. Provider   cephALEXin (KEFLEX) 500 MG capsule Take 1 capsule (500 mg total) by mouth 2 (two) times daily. 14 capsule Dominique Ressel G, DO   polyethylene glycol powder (GLYCOLAX/MIRALAX) 17 GM/SCOOP powder 17 g once or twice daily for constipation. 500 g Coral Spikes, DO      PDMP not reviewed this encounter.   Coral Spikes, [place] 01/21/21 1348

## 2021-01-21 NOTE — ED Triage Notes (Signed)
Pt presents toay with c/o of painful urination with urine hesitancy that began yesterday. He also c/o of constipation, LBM 01/18/21. Denies n/v/d.

## 2021-01-21 NOTE — Telephone Encounter (Signed)
Please check on him today -thanks (sending to [place])

## 2021-01-22 NOTE — Telephone Encounter (Signed)
Spoke to pt. He went to [place] yesterday. Started medication yesterday. Michela Pitcher he is already feeling better.

## 2021-01-24 LAB — URINE CULTURE: Culture: >=100000 — AB

## 2021-01-26 ENCOUNTER — Telehealth: Payer: Self-pay | Admitting: Internal Medicine

## 2021-01-26 NOTE — Telephone Encounter (Signed)
See other TE.

## 2021-01-26 NOTE — Telephone Encounter (Signed)
Pt called stating that he couldn't get into his my chart and he would like to know the results of his UTI.

## 2021-01-26 NOTE — Telephone Encounter (Signed)
Spoke to pt. He did see his results on MyChart. States he is still having some dysuria and dark urine. States he is drinking lots of fluids. Has 1 day left of Keflex. He is in [facility]. If he needs more antibiotic called in, send to [place]

## 2021-01-27 MED ORDER — CEPHALEXIN 500 MG PO CAPS: 500.0000 mg | ORAL_CAPSULE | Freq: Two times a day (BID) | ORAL | 0 refills | Status: DC

## 2021-01-27 NOTE — Addendum Note (Signed)
Addended by: Pilar Grammes on: 01/27/2021 09:55 AM   Modules accepted: Orders

## 2021-01-27 NOTE — Telephone Encounter (Signed)
Left detailed message on VM per DPR. Medication sent in.

## 2021-02-01 DIAGNOSIS — M25511 Pain in right shoulder: Secondary | ICD-10-CM | POA: Diagnosis not present

## 2021-02-01 DIAGNOSIS — M6281 Muscle weakness (generalized): Secondary | ICD-10-CM | POA: Diagnosis not present

## 2021-02-02 DIAGNOSIS — H04123 Dry eye syndrome of bilateral lacrimal glands: Secondary | ICD-10-CM | POA: Diagnosis not present

## 2021-02-03 DIAGNOSIS — M25511 Pain in right shoulder: Secondary | ICD-10-CM | POA: Diagnosis not present

## 2021-02-03 DIAGNOSIS — M6281 Muscle weakness (generalized): Secondary | ICD-10-CM | POA: Diagnosis not present

## 2021-02-04 ENCOUNTER — Other Ambulatory Visit: Payer: Self-pay

## 2021-02-04 ENCOUNTER — Encounter: Payer: Self-pay | Admitting: Internal Medicine

## 2021-02-04 ENCOUNTER — Ambulatory Visit (INDEPENDENT_AMBULATORY_CARE_PROVIDER_SITE_OTHER): Payer: Medicare Other | Admitting: Internal Medicine

## 2021-02-04 VITALS — BP 118/76 | HR 68 | Temp 97.4°F | Ht 66.0 in | Wt 214.0 lb

## 2021-02-04 DIAGNOSIS — N390 Urinary tract infection, site not specified: Secondary | ICD-10-CM | POA: Diagnosis not present

## 2021-02-04 DIAGNOSIS — G4733 Obstructive sleep apnea (adult) (pediatric): Secondary | ICD-10-CM

## 2021-02-04 MED ORDER — SULFAMETHOXAZOLE-TRIMETHOPRIM 800-160 MG PO TABS: 1.0000 | ORAL_TABLET | Freq: Two times a day (BID) | ORAL | 1 refills | Status: DC

## 2021-02-04 NOTE — Progress Notes (Signed)
Subjective:    Patient ID: [name], male    DOB: 07/03/1940, 80 y.o.   MRN: [id number]  HPI Here due to recurrent urinary symptoms This visit occurred during the SARS-CoV-2 public health emergency.  Safety protocols were in place, including screening questions prior to the visit, additional usage of staff PPE, and extensive cleaning of exam room while observing appropriate contact time as indicated for disinfecting solutions.   2 weeks ago---went to urgent care--due to 1 week of urinary symptoms Had sense of hard time breathing--had sense of abdominal swelling Burning dysuria for 3-4 days Couldn't get in here Urinalysis abnormal---Rx with cephalexin for 7 days--helped Symptoms started to recur within 1-2 days--was out of town fishing Never filled the different Rx I sent in Upon getting home--he started feeling some better  Did have sense "like there was an obstruction halfway up my penis" He notes he has to void quickly after his bladder meds  He does feel that he is emptying his bladder well Did have brief incontinence after going until he had the antibiotic  Current Outpatient Medications on File Prior to Visit  Medication Sig Dispense Refill   acetaminophen (TYLENOL) 500 MG tablet Take 500 mg by mouth every 8 (eight) hours as needed (for pain).      alfuzosin (UROXATRAL) 10 MG 24 hr tablet Take 1 tablet (10 mg total) by mouth daily. 90 tablet 3   ALPRAZolam (XANAX) 0.25 MG tablet TAKE 1 TABLET BY MOUTH AT BEDTIME AS NEEDED FOR ANXIETY 30 tablet 0   aspirin 81 MG tablet Take 81 mg by mouth every other day.     Black Pepper-Turmeric (TURMERIC COMPLEX/BLACK PEPPER PO) Take by mouth.     diclofenac (VOLTAREN) 75 MG EC tablet TAKE 1 TABLET BY MOUTH TWICE A DAY 180 tablet 1   Docusate Calcium (STOOL SOFTENER PO) Take by mouth.     doxazosin (CARDURA) 2 MG tablet Take 2 mg by mouth daily.     esomeprazole (NEXIUM) 40 MG capsule TAKE ONE CAPSULE BY MOUTH DAILY BEFORE BREAKFAST  90 capsule 3   finasteride (PROSCAR) 5 MG tablet Take 1 tablet (5 mg total) by mouth daily. 90 tablet 3   loratadine (CLARITIN) 10 MG tablet Take 10 mg by mouth daily.     OVER THE COUNTER MEDICATION Deep Blue Polyphenol     polyethylene glycol powder (GLYCOLAX/MIRALAX) 17 GM/SCOOP powder 17 g once or twice daily for constipation. 500 g 0   pravastatin (PRAVACHOL) 40 MG tablet TAKE ONE TABLET BY MOUTH EVERY EVENING 90 tablet 3   traMADol (ULTRAM) 50 MG tablet TAKE 1 TABLET BY MOUTH THREE TIMES A DAY AS NEEDED 90 tablet 0   No current facility-administered medications on file prior to visit.    Allergies  Allergen Reactions   Anesthetics, Amide Nausea Only    Other reaction(s): Vomiting After surgery patient has problems with nausea and vomiting due to anesthetics   Other Nausea Only and Nausea And Vomiting    After surgery patient has problems with nausea and vomiting due to anesthetics   Atorvastatin     REACTION: myalgias   Oxycodone Nausea And Vomiting   Simvastatin     REACTION: joint and stomach pain   Latex Rash    Local area  (gloves - when worn)   Statins Other (See Comments)    Joint pain    Past Medical History:  Diagnosis Date   Allergy    Anxiety    Aortic stenosis  Barrett's esophagus    BPH (benign prostatic hypertrophy)    Diaphragm dysfunction    right frozen diaphragm   Diverticulitis    GERD (gastroesophageal reflux disease)    Hiatal hernia    Hx of adenomatous colonic polyps    Hyperlipidemia    Kidney stones    Obstructive sleep apnea    BiPAP   Osteoarthritis    Osteoporosis    PONV (postoperative nausea and vomiting)    Spinal stenosis     Past Surgical History:  Procedure Laterality Date   CATARACT EXTRACTION W/PHACO Right 04/20/2020   Procedure: CATARACT EXTRACTION PHACO AND INTRAOCULAR LENS PLACEMENT ([place]) RIGHT;  Surgeon: Eulogio Bear, MD;  Location: [place];  Service: Ophthalmology;  Laterality: Right;   5.21 0:38.6   CATARACT EXTRACTION W/PHACO Left 05/11/2020   Procedure: CATARACT EXTRACTION PHACO AND INTRAOCULAR LENS PLACEMENT ([place]) LEFT;  Surgeon: Eulogio Bear, MD;  Location: [place];  Service: Ophthalmology;  Laterality: Left;  4.15 0:30.7   ESOPHAGOGASTRODUODENOSCOPY     colon--6/04, Barrett's--7/05, Barrett's but no cancer--4/08   EYE MUSCLE SURGERY     L eye muscle surgery--2/01   JOINT REPLACEMENT  1/12   left TKR   KNEE ARTHROSCOPY     7/ 09 Arthroscopy left knee--Dr Noemi Chapel   LIPOMA EXCISION Left 2010   compressing nerve by spine---Dr Terald Sleeper at [facility]     R rotator cuff repair--1/00  Noemi Chapel)   ROTATOR CUFF REPAIR Left ~2005 & 11/15   biceps repair also in 2015   TENDON REPAIR     Left knee tendon repair--1966   TOTAL KNEE ARTHROPLASTY Right 4/16   VASECTOMY     Vasectomy/spermatocele/?testicular cyst--1970    Family History  Problem Relation Age of Onset   Hypertension Mother    Breast cancer Mother    Colon polyps Mother    Coronary artery disease Mother    Diabetes Neg Hx    Colon cancer Neg Hx    Esophageal cancer Neg Hx    Gallbladder disease Neg Hx    Kidney disease Neg Hx     Social History   Socioeconomic History   Marital status: Married    Spouse name: Not on file   Number of children: 3   Years of education: Not on file   Highest education level: Not on file  Occupational History   Occupation: retired    Fish farm manager: RETIRED  Tobacco Use   Smoking status: Never   Smokeless tobacco: Never  Vaping Use   Vaping Use: Never used  Substance and Sexual Activity   Alcohol use: Not Currently    Alcohol/week: 0.0 standard drinks    Comment: occassional wine   Drug use: No   Sexual activity: Not on file  Other Topics Concern   Not on file  Social History Narrative   Lives at home with wife      Has living will   Wife is health care POA---alternate daughter [name]   Would accept resuscitation attempts   No  tube feeds if cognitively unaware         Social Determinants of Health   Financial Resource Strain: Not on file  Food Insecurity: Not on file  Transportation Needs: Not on file  Physical Activity: Not on file  Stress: Not on file  Social Connections: Not on file  Intimate Partner Violence: Not on file  'Review of Systems Bowels are moving fairly regular now Fever at first---not  since treatment     Objective:   Physical Exam Constitutional:      Appearance: Normal appearance.  Abdominal:     Palpations: Abdomen is soft.     Comments: Slight suprapubic tenderness Seems to have 4-5cm of suprapubic dullness  Genitourinary:    Comments: Prostate is diffusely enlarged No sig tenderness Neurological:     Mental Status: He is alert.           Assessment & Plan:

## 2021-02-04 NOTE — Assessment & Plan Note (Signed)
Had Klebsiella on culture----- resistant to amoxil and intermediate to nitrofurantoin Has had recurrence of symptoms---suggestive of urinary retention Will treat with bactrim this time---though no clear signs of prostatitis Scheduled with Dr Jeffie Pollock but not for 2 months--will try to push up his appt

## 2021-02-04 NOTE — Assessment & Plan Note (Signed)
Some more sense of trouble breathing Will make new referral for pulmonary--[place] left

## 2021-02-08 ENCOUNTER — Ambulatory Visit: Payer: Medicare Other

## 2021-02-10 ENCOUNTER — Other Ambulatory Visit: Payer: Medicare Other

## 2021-02-17 ENCOUNTER — Ambulatory Visit: Payer: Medicare Other | Admitting: Internal Medicine

## 2021-02-21 ENCOUNTER — Other Ambulatory Visit: Payer: Self-pay | Admitting: Internal Medicine

## 2021-02-24 ENCOUNTER — Ambulatory Visit: Payer: Medicare Other | Admitting: Internal Medicine

## 2021-03-09 DIAGNOSIS — S46011D Strain of muscle(s) and tendon(s) of the rotator cuff of right shoulder, subsequent encounter: Secondary | ICD-10-CM | POA: Diagnosis not present

## 2021-03-09 DIAGNOSIS — W1849XD Other slipping, tripping and stumbling without falling, subsequent encounter: Secondary | ICD-10-CM | POA: Diagnosis not present

## 2021-03-12 ENCOUNTER — Other Ambulatory Visit: Payer: Self-pay

## 2021-03-12 ENCOUNTER — Ambulatory Visit (INDEPENDENT_AMBULATORY_CARE_PROVIDER_SITE_OTHER): Payer: Medicare Other | Admitting: Internal Medicine

## 2021-03-12 ENCOUNTER — Encounter: Payer: Self-pay | Admitting: Internal Medicine

## 2021-03-12 VITALS — BP 112/76 | HR 72 | Temp 97.7°F | Ht 66.0 in | Wt 207.0 lb

## 2021-03-12 DIAGNOSIS — K219 Gastro-esophageal reflux disease without esophagitis: Secondary | ICD-10-CM | POA: Diagnosis not present

## 2021-03-12 DIAGNOSIS — G4733 Obstructive sleep apnea (adult) (pediatric): Secondary | ICD-10-CM

## 2021-03-12 DIAGNOSIS — M48062 Spinal stenosis, lumbar region with neurogenic claudication: Secondary | ICD-10-CM

## 2021-03-12 DIAGNOSIS — I10 Essential (primary) hypertension: Secondary | ICD-10-CM

## 2021-03-12 DIAGNOSIS — I700 Atherosclerosis of aorta: Secondary | ICD-10-CM | POA: Diagnosis not present

## 2021-03-12 DIAGNOSIS — I7121 Aneurysm of the ascending aorta, without rupture: Secondary | ICD-10-CM

## 2021-03-12 DIAGNOSIS — Z0000 Encounter for general adult medical examination without abnormal findings: Secondary | ICD-10-CM

## 2021-03-12 LAB — LIPID PANEL
Cholesterol: 184 mg/dL (ref 0–200)
HDL: 43.8 mg/dL (ref >39.00)
LDL Cholesterol: 125 mg/dL — ABNORMAL HIGH (ref 0–99)
NonHDL: 140.14
Total CHOL/HDL Ratio: 4
Triglycerides: 76 mg/dL (ref 0.0–149.0)
VLDL: 15.2 mg/dL (ref 0.0–40.0)

## 2021-03-12 LAB — HEPATIC FUNCTION PANEL
ALT: 14 U/L (ref 0–53)
AST: 18 U/L (ref 0–37)
Albumin: 4.3 g/dL (ref 3.5–5.2)
Alkaline Phosphatase: 46 U/L (ref 39–117)
Bilirubin, Direct: 0.1 mg/dL (ref 0.0–0.3)
Total Bilirubin: 1 mg/dL (ref 0.2–1.2)
Total Protein: 6.2 g/dL (ref 6.0–8.3)

## 2021-03-12 LAB — CBC
HCT: 39.4 % (ref 39.0–52.0)
Hemoglobin: 12.8 g/dL — ABNORMAL LOW (ref 13.0–17.0)
MCHC: 32.4 g/dL (ref 30.0–36.0)
MCV: 87.7 fl (ref 78.0–100.0)
Platelets: 234 10*3/uL (ref 150.0–400.0)
RBC: 4.49 Mil/uL (ref 4.22–5.81)
RDW: 14.1 % (ref 11.5–15.5)
WBC: 6 10*3/uL (ref 4.0–10.5)

## 2021-03-12 LAB — RENAL FUNCTION PANEL
Albumin: 4.3 g/dL (ref 3.5–5.2)
BUN: 24 mg/dL — ABNORMAL HIGH (ref 6–23)
CO2: 29 mEq/L (ref 19–32)
Calcium: 9.1 mg/dL (ref 8.4–10.5)
Chloride: 101 mEq/L (ref 96–112)
Creatinine, Ser: 1.05 mg/dL (ref 0.40–1.50)
GFR: 67.41 mL/min (ref >60.00)
Glucose, Bld: 86 mg/dL (ref 70–99)
Phosphorus: 3.7 mg/dL (ref 2.3–4.6)
Potassium: 4.5 mEq/L (ref 3.5–5.1)
Sodium: 138 mEq/L (ref 135–145)

## 2021-03-12 MED ORDER — ALPRAZOLAM 0.25 MG PO TABS: ORAL_TABLET | ORAL | 0 refills | Status: DC

## 2021-03-12 NOTE — Assessment & Plan Note (Signed)
Sleeps with BiPap nightly

## 2021-03-12 NOTE — Progress Notes (Signed)
Hearing Screening - Comments:: Passed whisper test Vision Screening - Comments:: October 2022  

## 2021-03-12 NOTE — Assessment & Plan Note (Signed)
Due for repeat CT next year

## 2021-03-12 NOTE — Assessment & Plan Note (Signed)
Is on statin. 

## 2021-03-12 NOTE — Assessment & Plan Note (Signed)
I have personally reviewed the Medicare Annual Wellness questionnaire and have noted 1. The patient's medical and social history 2. Their use of alcohol, tobacco or illicit drugs 3. Their current medications and supplements 4. The patient's functional ability including ADL's, fall risks, home safety risks and hearing or visual             impairment. 5. Diet and physical activities 6. Evidence for depression or mood disorders  The patients weight, height, BMI and visual acuity have been recorded in the chart I have made referrals, counseling and provided education to the patient based review of the above and I have provided the pt with a written personalized care plan for preventive services.  I have provided you with a copy of your personalized plan for preventive services. Please take the time to review along with your updated medication list.  Needs to work more on aerobic fitness Done with cancer screening Prefers no COVID or flu vaccines Will consider shingrix when covered

## 2021-03-12 NOTE — Assessment & Plan Note (Signed)
Okay with nexium

## 2021-03-12 NOTE — Progress Notes (Signed)
Subjective:    Patient ID: [name], male    DOB: 11-09-1940, 80 y.o.   MRN: [id number]  HPI Here for Medicare wellness visit and follow up of chronic health conditions This visit occurred during the SARS-CoV-2 public health emergency.  Safety protocols were in place, including screening questions prior to the visit, additional usage of staff PPE, and extensive cleaning of exam room while observing appropriate contact time as indicated for disinfecting solutions.   Reviewed advanced directives Reviewed other doctors---Dr Speer--ortho, Dr Waymond Cera, Dr Jonna Clark, Dr Darcus Pester, Dr Barbee Cough, Dr Hochrein--cardiology, Dr Jetty Duhamel, Dr Valla Leaver, [facility] Dermatology Had both cataracts removed in the past year. No hospitalizations Goes to the gym 3 times a week Vision is okay--needs reading glasses Hearing is okay--gets cerumen removed twice a year Rare glass of wine. No tobacco Did fall once--actually tripped and hit shoulder on the wall. No fall to ground No depression or anhedonia Does some of his yard and housework. They have chickens No sig memory issues  Feels his age--tired at times Exhausted after lunch--nap will help  Ongoing trouble passing urine Will see Dr Levonne Spiller he actually notes midline low back pain Had procedure---nerve ablation--and that helped somewhat Not everyday pain Stopped the tramadol Uses the xanax at night to help him sleep (1/2)  Has CT scheduled again for next year to check aneurysm  Known aortic atherosclerosis also  Just released from the orthopedist for right shoulder Had great response to PT  No chest pain No palpitations No dizziness or syncope No SOB No edema  Current Outpatient Medications on File Prior to Visit  Medication Sig Dispense Refill   acetaminophen (TYLENOL) 500 MG tablet Take 500 mg by mouth every 8 (eight) hours as needed (for pain).      alfuzosin (UROXATRAL) 10 MG 24 hr  tablet Take 1 tablet (10 mg total) by mouth daily. 90 tablet 3   ALPRAZolam (XANAX) 0.25 MG tablet TAKE 1 TABLET BY MOUTH AT BEDTIME AS NEEDED FOR ANXIETY 30 tablet 0   aspirin 81 MG tablet Take 81 mg by mouth every other day.     Black Pepper-Turmeric (TURMERIC COMPLEX/BLACK PEPPER PO) Take by mouth.     diclofenac (VOLTAREN) 75 MG EC tablet TAKE 1 TABLET BY MOUTH TWICE A DAY 180 tablet 1   Docusate Calcium (STOOL SOFTENER PO) Take by mouth.     doxazosin (CARDURA) 2 MG tablet Take 2 mg by mouth daily.     esomeprazole (NEXIUM) 40 MG capsule TAKE ONE CAPSULE BY MOUTH DAILY BEFORE BREAKFAST 90 capsule 3   finasteride (PROSCAR) 5 MG tablet Take 1 tablet (5 mg total) by mouth daily. 90 tablet 3   loratadine (CLARITIN) 10 MG tablet Take 10 mg by mouth daily.     OVER THE COUNTER MEDICATION Deep Blue Polyphenol     polyethylene glycol powder (GLYCOLAX/MIRALAX) 17 GM/SCOOP powder 17 g once or twice daily for constipation. 500 g 0   pravastatin (PRAVACHOL) 40 MG tablet TAKE ONE TABLET BY MOUTH EVERY EVENING 90 tablet 3   No current facility-administered medications on file prior to visit.    Allergies  Allergen Reactions   Anesthetics, Amide Nausea Only    Other reaction(s): Vomiting After surgery patient has problems with nausea and vomiting due to anesthetics   Other Nausea Only and Nausea And Vomiting    After surgery patient has problems with nausea and vomiting due to anesthetics   Atorvastatin     REACTION: myalgias   Oxycodone Nausea  And Vomiting   Simvastatin     REACTION: joint and stomach pain   Latex Rash    Local area  (gloves - when worn)   Statins Other (See Comments)    Joint pain    Past Medical History:  Diagnosis Date   Allergy    Anxiety    Aortic stenosis    Barrett's esophagus    BPH (benign prostatic hypertrophy)    Diaphragm dysfunction    right frozen diaphragm   Diverticulitis    GERD (gastroesophageal reflux disease)    Hiatal hernia    Hx of  adenomatous colonic polyps    Hyperlipidemia    Kidney stones    Obstructive sleep apnea    BiPAP   Osteoarthritis    Osteoporosis    PONV (postoperative nausea and vomiting)    Spinal stenosis     Past Surgical History:  Procedure Laterality Date   CATARACT EXTRACTION W/PHACO Right 04/20/2020   Procedure: CATARACT EXTRACTION PHACO AND INTRAOCULAR LENS PLACEMENT ([place]) RIGHT;  Surgeon: Eulogio Bear, MD;  Location: [facility];  Service: Ophthalmology;  Laterality: Right;  5.21 0:38.6   CATARACT EXTRACTION W/PHACO Left 05/11/2020   Procedure: CATARACT EXTRACTION PHACO AND INTRAOCULAR LENS PLACEMENT ([place]) LEFT;  Surgeon: Eulogio Bear, MD;  Location: [facility];  Service: Ophthalmology;  Laterality: Left;  4.15 0:30.7   ESOPHAGOGASTRODUODENOSCOPY     colon--6/04, Barrett's--7/05, Barrett's but no cancer--4/08   EYE MUSCLE SURGERY     L eye muscle surgery--2/01   JOINT REPLACEMENT  1/12   left TKR   KNEE ARTHROSCOPY     7/ 09 Arthroscopy left knee--Dr Noemi Chapel   LIPOMA EXCISION Left 2010   compressing nerve by spine---Dr Terald Sleeper at [place]     R rotator cuff repair--1/00  Noemi Chapel)   ROTATOR CUFF REPAIR Left ~2005 & 11/15   biceps repair also in 2015   TENDON REPAIR     Left knee tendon repair--1966   TOTAL KNEE ARTHROPLASTY Right 4/16   VASECTOMY     Vasectomy/spermatocele/?testicular cyst--1970    Family History  Problem Relation Age of Onset   Hypertension Mother    Breast cancer Mother    Colon polyps Mother    Coronary artery disease Mother    Diabetes Neg Hx    Colon cancer Neg Hx    Esophageal cancer Neg Hx    Gallbladder disease Neg Hx    Kidney disease Neg Hx     Social History   Socioeconomic History   Marital status: Married    Spouse name: Not on file   Number of children: 3   Years of education: Not on file   Highest education level: Not on file  Occupational History   Occupation: retired    Fish farm manager:  RETIRED  Tobacco Use   Smoking status: Never   Smokeless tobacco: Never  Vaping Use   Vaping Use: Never used  Substance and Sexual Activity   Alcohol use: Not Currently    Alcohol/week: 0.0 standard drinks    Comment: occassional wine   Drug use: No   Sexual activity: Not on file  Other Topics Concern   Not on file  Social History Narrative   Lives at home with wife      Has living will   Wife is health care POA---alternate daughter [name]   Would accept resuscitation attempts   No tube feeds if cognitively unaware  Social Determinants of Health   Financial Resource Strain: Not on file  Food Insecurity: Not on file  Transportation Needs: Not on file  Physical Activity: Not on file  Stress: Not on file  Social Connections: Not on file  Intimate Partner Violence: Not on file   Review of Systems Appetite is good Weight is stable Wears seat belt Teeth okay---keeps up with dentist Has spot on left ear he wants checked No heartburn on nexium--takes most days. No dysphagia Bowels are regular--no blood     Objective:   Physical Exam Constitutional:      Appearance: Normal appearance.  HENT:     Mouth/Throat:     Comments: No lesions Eyes:     Conjunctiva/sclera: Conjunctivae normal.     Pupils: Pupils are equal, round, and reactive to light.  Cardiovascular:     Rate and Rhythm: Normal rate and regular rhythm.     Pulses: Normal pulses.     Heart sounds:    No gallop.     Comments: Soft systolic murmur--louder at apex Pulmonary:     Effort: Pulmonary effort is normal.     Breath sounds: Normal breath sounds. No wheezing or rales.  Abdominal:     Palpations: Abdomen is soft.     Tenderness: There is no abdominal tenderness.  Musculoskeletal:     Cervical back: Neck supple.     Right lower leg: No edema.     Left lower leg: No edema.  Lymphadenopathy:     Cervical: No cervical adenopathy.  Skin:    Findings: No rash.     Comments: Small skin tag  on back of left pinna  Neurological:     Mental Status: He is alert and oriented to person, place, and time.     Comments: President--"Joe Biden, Trump, Obama" [id number] D-l-[id number] Recall 3/3  Psychiatric:        Mood and Affect: Mood normal.        Behavior: Behavior normal.           Assessment & Plan:

## 2021-03-12 NOTE — Assessment & Plan Note (Signed)
Quite limiting for him Off the tramadol

## 2021-03-12 NOTE — Assessment & Plan Note (Signed)
BP Readings from Last 3 Encounters:  03/12/21 112/76  02/04/21 118/76  01/21/21 129/83   Good control on doxazosin If orthostatic symptoms--would stop that

## 2021-03-17 DIAGNOSIS — M48062 Spinal stenosis, lumbar region with neurogenic claudication: Secondary | ICD-10-CM | POA: Diagnosis not present

## 2021-03-17 DIAGNOSIS — M5136 Other intervertebral disc degeneration, lumbar region: Secondary | ICD-10-CM | POA: Diagnosis not present

## 2021-03-17 DIAGNOSIS — M47816 Spondylosis without myelopathy or radiculopathy, lumbar region: Secondary | ICD-10-CM | POA: Diagnosis not present

## 2021-03-17 DIAGNOSIS — M5416 Radiculopathy, lumbar region: Secondary | ICD-10-CM | POA: Diagnosis not present

## 2021-03-18 ENCOUNTER — Encounter: Payer: Self-pay | Admitting: Family Medicine

## 2021-03-18 ENCOUNTER — Other Ambulatory Visit: Payer: Self-pay

## 2021-03-18 ENCOUNTER — Telehealth (INDEPENDENT_AMBULATORY_CARE_PROVIDER_SITE_OTHER): Payer: Medicare Other | Admitting: Family Medicine

## 2021-03-18 VITALS — BP 130/78 | HR 89 | Temp 97.8°F | Ht 66.0 in

## 2021-03-18 DIAGNOSIS — J0110 Acute frontal sinusitis, unspecified: Secondary | ICD-10-CM

## 2021-03-18 MED ORDER — AMOXICILLIN 500 MG PO CAPS: 1000.0000 mg | ORAL_CAPSULE | Freq: Two times a day (BID) | ORAL | 0 refills | Status: DC

## 2021-03-18 NOTE — Patient Instructions (Signed)
Use nasal saline irrigation daily.  Can try mucinex DM daily as needed, Tylenol  as needed pain. Test for COVID  at home. If not improving after 7-10 days of illness, can fill  prescription for antibiotics.  Go to ER if severe shortness of breath.

## 2021-03-18 NOTE — Progress Notes (Signed)
VIRTUAL VISIT Due to national recommendations of social distancing due to [place] 19, a virtual visit is felt to be most appropriate for this patient at this time.   I connected with the patient on 03/18/21 at  4:00 PM EST by virtual telehealth platform and verified that I am speaking with the correct person using two identifiers.   I discussed the limitations, risks, security and privacy concerns of performing an evaluation and management service by  virtual telehealth platform and the availability of in person appointments. I also discussed with the patient that there may be a patient responsible charge related to this service. The patient expressed understanding and agreed to proceed.  Patient location: Home Provider Location: [place] Participants: Eliezer Lofts and Guerry Minors   Chief Complaint  Patient presents with   Nasal Congestion   Sinusitis    History of Present Illness:  80 year old male patient of Dr. Alla German with history of HTN, allergic rhinitis. Presents with new onset nasal congestion.  Date of onset:  11/7  Felt like he had a cold, symptoms resolved.  Nasal congestion, he typically has in spring and fall. No sinus pressure, has headache above eyes. Left ear pain Fever 2 days ago..Subjective  took tylenol and it resolved.  No SOB, occ coughing spells.  He has been doing medi rinse.   Use bipap machine.   Had UTI in last month.. took antibiotics.  No chronic respiratory issues.   COVID 19 screen COVID testing: COVID vaccine: none COVID exposure: No recent travel or known exposure to COVID19  The importance of social distancing was discussed today.    Review of Systems  Constitutional:  Positive for fever. Negative for chills.  HENT:  Positive for congestion and ear pain.   Eyes:  Negative for pain and redness.  Respiratory:  Positive for cough. Negative for shortness of breath.   Cardiovascular:  Negative for chest pain, palpitations and  leg swelling.  Gastrointestinal:  Negative for abdominal pain, blood in stool, constipation, diarrhea, nausea and vomiting.  Genitourinary:  Negative for dysuria.  Musculoskeletal:  Negative for falls and myalgias.  Skin:  Negative for rash.  Neurological:  Negative for dizziness.  Psychiatric/Behavioral:  Negative for depression. The patient is not nervous/anxious.      Past Medical History:  Diagnosis Date   Allergy    Anxiety    Aortic stenosis    Barrett's esophagus    BPH (benign prostatic hypertrophy)    Diaphragm dysfunction    right frozen diaphragm   Diverticulitis    GERD (gastroesophageal reflux disease)    Hiatal hernia    Hx of adenomatous colonic polyps    Hyperlipidemia    Kidney stones    Obstructive sleep apnea    BiPAP   Osteoarthritis    Osteoporosis    PONV (postoperative nausea and vomiting)    Spinal stenosis     reports that he has never smoked. He has never used smokeless tobacco. He reports that he does not currently use alcohol. He reports that he does not use drugs.   Current Outpatient Medications:    acetaminophen (TYLENOL) 500 MG tablet, Take 500 mg by mouth every 8 (eight) hours as needed (for pain). , Disp: , Rfl:    alfuzosin (UROXATRAL) 10 MG 24 hr tablet, Take 1 tablet (10 mg total) by mouth daily., Disp: 90 tablet, Rfl: 3   ALPRAZolam (XANAX) 0.25 MG tablet, TAKE 1 TABLET BY MOUTH AT BEDTIME AS NEEDED  FOR ANXIETY, Disp: 30 tablet, Rfl: 0   aspirin 81 MG tablet, Take 81 mg by mouth every other day., Disp: , Rfl:    Black Pepper-Turmeric (TURMERIC COMPLEX/BLACK PEPPER PO), Take by mouth., Disp: , Rfl:    diclofenac (VOLTAREN) 75 MG EC tablet, TAKE 1 TABLET BY MOUTH TWICE A DAY, Disp: 180 tablet, Rfl: 1   Docusate Calcium (STOOL SOFTENER PO), Take by mouth., Disp: , Rfl:    doxazosin (CARDURA) 4 MG tablet, Take 4 mg by mouth daily., Disp: , Rfl:    esomeprazole (NEXIUM) 40 MG capsule, TAKE ONE CAPSULE BY MOUTH DAILY BEFORE BREAKFAST, Disp: 90  capsule, Rfl: 3   finasteride (PROSCAR) 5 MG tablet, Take 1 tablet (5 mg total) by mouth daily., Disp: 90 tablet, Rfl: 3   loratadine (CLARITIN) 10 MG tablet, Take 10 mg by mouth daily., Disp: , Rfl:    OVER THE COUNTER MEDICATION, Deep Blue Polyphenol, Disp: , Rfl:    polyethylene glycol powder (GLYCOLAX/MIRALAX) 17 GM/SCOOP powder, 17 g once or twice daily for constipation., Disp: 500 g, Rfl: 0   pravastatin (PRAVACHOL) 40 MG tablet, TAKE ONE TABLET BY MOUTH EVERY EVENING, Disp: 90 tablet, Rfl: 3   Observations/Objective: Blood pressure 130/78, pulse 89, temperature 97.8 F (36.6 C), temperature source Temporal, height 5\' 6"  (1.676 m).  Physical Exam  Physical Exam Constitutional:      General: The patient is not in acute distress. Pulmonary:     Effort: Pulmonary effort is normal. No respiratory distress.  Neurological:     Mental Status: The patient is alert and oriented to person, place, and time.  Psychiatric:        Mood and Affect: Mood normal.        Behavior: Behavior normal.   Assessment and Plan Problem List Items Addressed This Visit     Acute non-recurrent frontal sinusitis - Primary    Viral vs bacterial   Use nasal saline irrigation daily.  Can try mucinex DM daily as needed, Tylenol  as needed pain. Test for COVID  at home. If not improving after 7-10 days of illness, can fill  prescription for antibiotics.  Go to ER if severe shortness of breath.      Relevant Medications   amoxicillin (AMOXIL) 500 MG capsule      I discussed the assessment and treatment plan with the patient. The patient was provided an opportunity to ask questions and all were answered. The patient agreed with the plan and demonstrated an understanding of the instructions.   The patient was advised to call back or seek an in-person evaluation if the symptoms worsen or if the condition fails to improve as anticipated.     Eliezer Lofts, MD

## 2021-03-18 NOTE — Assessment & Plan Note (Signed)
Viral vs bacterial   Use nasal saline irrigation daily.  Can try mucinex DM daily as needed, Tylenol  as needed pain. Test for COVID  at home. If not improving after 7-10 days of illness, can fill  prescription for antibiotics.  Go to ER if severe shortness of breath.

## 2021-04-07 DIAGNOSIS — M5136 Other intervertebral disc degeneration, lumbar region: Secondary | ICD-10-CM | POA: Diagnosis not present

## 2021-04-07 DIAGNOSIS — M48062 Spinal stenosis, lumbar region with neurogenic claudication: Secondary | ICD-10-CM | POA: Diagnosis not present

## 2021-04-07 DIAGNOSIS — M5416 Radiculopathy, lumbar region: Secondary | ICD-10-CM | POA: Diagnosis not present

## 2021-04-12 DIAGNOSIS — H903 Sensorineural hearing loss, bilateral: Secondary | ICD-10-CM | POA: Diagnosis not present

## 2021-04-12 DIAGNOSIS — J392 Other diseases of pharynx: Secondary | ICD-10-CM | POA: Diagnosis not present

## 2021-04-12 DIAGNOSIS — H6123 Impacted cerumen, bilateral: Secondary | ICD-10-CM | POA: Diagnosis not present

## 2021-04-14 DIAGNOSIS — M48062 Spinal stenosis, lumbar region with neurogenic claudication: Secondary | ICD-10-CM | POA: Diagnosis not present

## 2021-04-14 DIAGNOSIS — M5416 Radiculopathy, lumbar region: Secondary | ICD-10-CM | POA: Diagnosis not present

## 2021-04-19 DIAGNOSIS — R351 Nocturia: Secondary | ICD-10-CM | POA: Diagnosis not present

## 2021-04-19 DIAGNOSIS — N401 Enlarged prostate with lower urinary tract symptoms: Secondary | ICD-10-CM | POA: Diagnosis not present

## 2021-04-19 DIAGNOSIS — N3000 Acute cystitis without hematuria: Secondary | ICD-10-CM | POA: Diagnosis not present

## 2021-04-19 DIAGNOSIS — R3914 Feeling of incomplete bladder emptying: Secondary | ICD-10-CM | POA: Diagnosis not present

## 2021-04-21 DIAGNOSIS — M545 Low back pain, unspecified: Secondary | ICD-10-CM | POA: Diagnosis not present

## 2021-04-21 DIAGNOSIS — R262 Difficulty in walking, not elsewhere classified: Secondary | ICD-10-CM | POA: Diagnosis not present

## 2021-04-21 DIAGNOSIS — M6281 Muscle weakness (generalized): Secondary | ICD-10-CM | POA: Diagnosis not present

## 2021-04-26 ENCOUNTER — Other Ambulatory Visit: Payer: Self-pay | Admitting: Internal Medicine

## 2021-04-26 NOTE — Telephone Encounter (Signed)
Last filled 03-12-21 #30 Last OV 03-12-21 Next OV 04-12-22 [facility]

## 2021-04-27 DIAGNOSIS — M6281 Muscle weakness (generalized): Secondary | ICD-10-CM | POA: Diagnosis not present

## 2021-04-27 DIAGNOSIS — R262 Difficulty in walking, not elsewhere classified: Secondary | ICD-10-CM | POA: Diagnosis not present

## 2021-04-27 DIAGNOSIS — M545 Low back pain, unspecified: Secondary | ICD-10-CM | POA: Diagnosis not present

## 2021-04-29 ENCOUNTER — Telehealth: Payer: Self-pay | Admitting: Internal Medicine

## 2021-04-29 NOTE — Chronic Care Management (AMB) (Signed)
°  Chronic Care Management   Note  04/29/2021 Name: JAZION ATTEBERRY MRN: [id number] DOB: 03/25/1941  Guerry Minors is a 80 y.o. year old male who is a primary care patient of Venia Carbon, MD. I reached out to Guerry Minors by phone today in response to a referral sent by Mr. Buford Dresser Turrell's PCP, Venia Carbon, MD.   Mr. Flammer was given information about Chronic Care Management services today including:  CCM service includes personalized support from designated clinical staff supervised by his physician, including individualized plan of care and coordination with other care providers 24/7 contact phone numbers for assistance for urgent and routine care needs. Service will only be billed when office clinical staff spend 20 minutes or more in a month to coordinate care. Only one practitioner may furnish and bill the service in a calendar month. The patient may stop CCM services at any time (effective at the end of the month) by phone call to the office staff.   Patient agreed to services and verbal consent obtained.   Follow up plan:   Tatjana Secretary/administrator

## 2021-05-07 DIAGNOSIS — N139 Obstructive and reflux uropathy, unspecified: Secondary | ICD-10-CM | POA: Diagnosis not present

## 2021-05-07 DIAGNOSIS — R1084 Generalized abdominal pain: Secondary | ICD-10-CM | POA: Diagnosis not present

## 2021-05-07 DIAGNOSIS — N3000 Acute cystitis without hematuria: Secondary | ICD-10-CM | POA: Diagnosis not present

## 2021-05-11 DIAGNOSIS — M5136 Other intervertebral disc degeneration, lumbar region: Secondary | ICD-10-CM | POA: Diagnosis not present

## 2021-05-11 DIAGNOSIS — M47816 Spondylosis without myelopathy or radiculopathy, lumbar region: Secondary | ICD-10-CM | POA: Diagnosis not present

## 2021-05-11 DIAGNOSIS — M5416 Radiculopathy, lumbar region: Secondary | ICD-10-CM | POA: Diagnosis not present

## 2021-05-12 DIAGNOSIS — M47816 Spondylosis without myelopathy or radiculopathy, lumbar region: Secondary | ICD-10-CM | POA: Diagnosis not present

## 2021-05-13 DIAGNOSIS — M545 Low back pain, unspecified: Secondary | ICD-10-CM | POA: Diagnosis not present

## 2021-05-13 DIAGNOSIS — R262 Difficulty in walking, not elsewhere classified: Secondary | ICD-10-CM | POA: Diagnosis not present

## 2021-05-13 DIAGNOSIS — M6281 Muscle weakness (generalized): Secondary | ICD-10-CM | POA: Diagnosis not present

## 2021-05-18 DIAGNOSIS — R262 Difficulty in walking, not elsewhere classified: Secondary | ICD-10-CM | POA: Diagnosis not present

## 2021-05-18 DIAGNOSIS — M545 Low back pain, unspecified: Secondary | ICD-10-CM | POA: Diagnosis not present

## 2021-05-18 DIAGNOSIS — M6281 Muscle weakness (generalized): Secondary | ICD-10-CM | POA: Diagnosis not present

## 2021-05-20 DIAGNOSIS — M6281 Muscle weakness (generalized): Secondary | ICD-10-CM | POA: Diagnosis not present

## 2021-05-20 DIAGNOSIS — M545 Low back pain, unspecified: Secondary | ICD-10-CM | POA: Diagnosis not present

## 2021-05-20 DIAGNOSIS — R262 Difficulty in walking, not elsewhere classified: Secondary | ICD-10-CM | POA: Diagnosis not present

## 2021-05-25 DIAGNOSIS — M6281 Muscle weakness (generalized): Secondary | ICD-10-CM | POA: Diagnosis not present

## 2021-05-25 DIAGNOSIS — R262 Difficulty in walking, not elsewhere classified: Secondary | ICD-10-CM | POA: Diagnosis not present

## 2021-05-25 DIAGNOSIS — M545 Low back pain, unspecified: Secondary | ICD-10-CM | POA: Diagnosis not present

## 2021-05-26 DIAGNOSIS — M47816 Spondylosis without myelopathy or radiculopathy, lumbar region: Secondary | ICD-10-CM | POA: Diagnosis not present

## 2021-06-02 ENCOUNTER — Other Ambulatory Visit: Payer: Self-pay | Admitting: Internal Medicine

## 2021-06-02 NOTE — Telephone Encounter (Signed)
Last OV - 03/12/2021 Next OV - 04/12/2022 Last Filled -04/26/2021

## 2021-06-03 DIAGNOSIS — M47816 Spondylosis without myelopathy or radiculopathy, lumbar region: Secondary | ICD-10-CM | POA: Diagnosis not present

## 2021-06-11 ENCOUNTER — Telehealth: Payer: Self-pay

## 2021-06-11 NOTE — Progress Notes (Signed)
Chronic Care Management Pharmacy Assistant   Name: [name]  MRN: [id number] DOB: [birth year]  Reason for Encounter: CCM (Initial Questions)   Recent office visits:  03/12/2021 - Arthur Simpler, MD - Patient presented for Medical Wellness Visit. Labs: CBC, Lipid, Renal function and Hepatic function panel. Stop due to completed courses: sulfamethoxazole-trimethoprim (BACTRIM DS) 800-160 MG tablet and traMADol (ULTRAM) 50 MG tablet. No other medication changes.  02/04/2021 - Arthur Simpler, MD - Patient presented for UTI. Referral to Urology and Pulmonology. Start: sulfamethoxazole-trimethoprim (BACTRIM DS) 800-160 MG tablet - 1 tablet 2 times daily due to Klebsiella on urine culture.  01/26/2021 - Arthur Simpler, MD - Telephone - Patient was diagnosed with UTI on 01/21/2021 at urgent care. Patient requested more Cephalexin. Refill called in to pharmacy. cephALEXin (KEFLEX) 500 MG capsule  Recent consult visits:  05/26/2021 - [name] - Physical Medicine - Patient presented for Lumbar radiculitis.Administered: MBB #2 05/12/2021 - [name] - Physical Medicine - Patient presented for Lumbar radiculitis.Administered: MBB #1 05/11/2021 - [name] - Physical Medicine - Patient presented for Lumbar radiculitis. 04/14/2021 - [name] - Physical Medicine - Patient presented for Lumbar radiculitis.  04/12/2021 - Radiology - Patient presented for MRI Spine.  04/07/2021 - [name] - Physical Medicine - Patient presented for degenerative disc disease.  03/18/2021 - Arthur Lofts, MD - Family Medicine - Patient presented for nasal congestion. Start: amoxicillin (AMOXIL) 500 MG capsule - 2 times daily. Start: doxazosin (CARDURA) 4 MG tablet - 4 mg daily vs 2 mg daily. Start: Nasal saline irrigation daily. Suggested: Mucinex DM daily as needed; Tylenol for pain.  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  03/09/2021 - Arthur Mains, MD - Orthopedics - Patient presented for tear of right rotator cuff.  01/21/2021 - [name], [facility] - Patient presented for dysuria and constipation. Start: cephALEXin (KEFLEX) 500 MG capsule - 1 capsule 2 times daily. Start: polyethylene glycol powder (GLYCOLAX/MIRALAX) 17 GM/SCOOP powder - 17 g once or twice daily for constipation. Final Diagnoses: Acute cystitis with hematuria.  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 Stop by patient: Tramadol TID PRN #90 and Tylenol.  12/24/2020 - [name] - Patient presented for tear of right rotator cuff. No other information.   Hospital visits:  None in previous 6 months  Medications: Outpatient Encounter Medications as of 06/11/2021  Medication Sig   acetaminophen (TYLENOL) 500 MG tablet Take 500 mg by mouth every 8 (eight) hours as needed (for pain).    alfuzosin (UROXATRAL) 10 MG 24 hr tablet Take 1 tablet (10 mg total) by mouth daily.   ALPRAZolam (XANAX) 0.25 MG tablet TAKE 1 TABLET BY MOUTH AT BEDTIME AS NEEDED FOR ANXIETY   amoxicillin (AMOXIL) 500 MG capsule Take 2 capsules (1,000 mg total) by mouth 2 (two) times daily.   aspirin 81 MG tablet Take 81 mg by mouth every other day.   Black Pepper-Turmeric (TURMERIC COMPLEX/BLACK PEPPER PO) Take by mouth.   diclofenac (VOLTAREN) 75 MG EC tablet TAKE 1 TABLET BY MOUTH TWICE A DAY   Docusate Calcium (STOOL SOFTENER PO) Take by mouth.   doxazosin (CARDURA) 4 MG tablet Take 4 mg by mouth daily.   esomeprazole (NEXIUM) 40 MG capsule TAKE ONE CAPSULE BY MOUTH EVERY MORNING BEFORE BREAKFAST   finasteride (PROSCAR) 5  MG tablet Take 1 tablet (5 mg total) by mouth daily.   loratadine (CLARITIN) 10 MG tablet Take 10 mg by mouth daily.   OVER THE COUNTER MEDICATION Deep Blue Polyphenol   polyethylene glycol powder  (GLYCOLAX/MIRALAX) 17 GM/SCOOP powder 17 g once or twice daily for constipation.   pravastatin (PRAVACHOL) 40 MG tablet TAKE ONE TABLET BY MOUTH EVERY EVENING   No facility-administered encounter medications on file as of 06/11/2021.   No results found for: HGBA1C, MICROALBUR   BP Readings from Last 3 Encounters:  03/18/21 130/78  03/12/21 112/76  02/04/21 118/76   Patient contacted to review initial questions prior to visit with [name]. Patient declined and would like to unenroll from CCM.   Star Rating Drugs:  Medication:  Last Fill: [facility] Pravastatin 40 mg 06/13/2020 90  No recent star rating drugs  Care Gaps: Annual wellness visit in last year? Yes 03/12/2021 Most Recent BP reading: 130/78  [name], CPP notified  [name], [place] Clinical Pharmacy Assistant [phone]  Time Spent: 40 minutes other patient time

## 2021-06-16 ENCOUNTER — Telehealth: Payer: Medicare Other

## 2021-07-02 ENCOUNTER — Other Ambulatory Visit: Payer: Self-pay

## 2021-07-02 ENCOUNTER — Encounter: Payer: Self-pay | Admitting: Internal Medicine

## 2021-07-02 ENCOUNTER — Ambulatory Visit (INDEPENDENT_AMBULATORY_CARE_PROVIDER_SITE_OTHER): Payer: Medicare Other | Admitting: Internal Medicine

## 2021-07-02 VITALS — BP 102/64 | HR 76 | Temp 97.9°F | Ht 66.0 in | Wt 215.0 lb

## 2021-07-02 DIAGNOSIS — M19012 Primary osteoarthritis, left shoulder: Secondary | ICD-10-CM

## 2021-07-02 DIAGNOSIS — M48062 Spinal stenosis, lumbar region with neurogenic claudication: Secondary | ICD-10-CM | POA: Diagnosis not present

## 2021-07-02 MED ORDER — MELOXICAM 15 MG PO TABS: 15.0000 mg | ORAL_TABLET | Freq: Every day | ORAL | 3 refills | Status: DC | PRN

## 2021-07-02 MED ORDER — TRIAMCINOLONE ACETONIDE 40 MG/ML IJ SUSP
40.0000 mg | Freq: Once | INTRAMUSCULAR | Status: AC
  Administered 2021-07-02: 40 mg via INTRA_ARTICULAR

## 2021-07-02 NOTE — Assessment & Plan Note (Signed)
Ongoing limitations Has had rhizotomy by Dr Mindi Slicker some Diclofenac helps--but causes mouth sores---will try changing to meloxicam 15mg  daily prn Discussed other alternatives---like narcotics, gabapetin, duloxetine. Will hold off for now

## 2021-07-02 NOTE — Assessment & Plan Note (Signed)
Discussed alternatives Verbal consent given  PROCEDURE Sterile prep --posterior approach Ethyl chloride then 2cc 1% plain lidocaine 40 mg triamcinolone/5cc 1% lidocaine instilled without difficulty Tolerated well and noticed improvement in pain right away Discussed home care

## 2021-07-02 NOTE — Addendum Note (Signed)
Addended by: Pilar Grammes on: 07/02/2021 11:07 AM   Modules accepted: Orders

## 2021-07-02 NOTE — Progress Notes (Signed)
Subjective:    Patient ID: [name], male    DOB: 13-Nov-1940, 81 y.o.   MRN: [id number]  HPI Here due to ongoing shoulder pain--for cortisone injection Also to discuss treatment alternatives for his back pain  Increasing left shoulder pain Injection really helped last year Feels the diclofenac is not tolerated--gets sores in his mouth (so only taking one and it still helps)  Did have bilateral nerve burn Rx Has helped the sciatic nerve pain--but still has pain along his belt line Trouble getting going in the morning---using a belt of ice does help Goes to the gym most mornings and is able to get moving Never goes away though Tried lidocaine during the day---didn't seem to help Has increased symptoms with prolonged standing (even 15 minutes) Can stay on treadmill/holding on--up to 12 minutes  Current Outpatient Medications on File Prior to Visit  Medication Sig Dispense Refill   acetaminophen (TYLENOL) 500 MG tablet Take 500 mg by mouth every 8 (eight) hours as needed (for pain).      alfuzosin (UROXATRAL) 10 MG 24 hr tablet Take 1 tablet (10 mg total) by mouth daily. 90 tablet 3   ALPRAZolam (XANAX) 0.25 MG tablet TAKE 1 TABLET BY MOUTH AT BEDTIME AS NEEDED FOR ANXIETY 30 tablet 0   aspirin 81 MG tablet Take 81 mg by mouth every other day.     Black Pepper-Turmeric (TURMERIC COMPLEX/BLACK PEPPER PO) Take by mouth.     diclofenac (VOLTAREN) 75 MG EC tablet TAKE 1 TABLET BY MOUTH TWICE A DAY 180 tablet 1   doxazosin (CARDURA) 4 MG tablet Take 4 mg by mouth daily.     esomeprazole (NEXIUM) 40 MG capsule TAKE ONE CAPSULE BY MOUTH EVERY MORNING BEFORE BREAKFAST 90 capsule 3   finasteride (PROSCAR) 5 MG tablet Take 1 tablet (5 mg total) by mouth daily. 90 tablet 3   loratadine (CLARITIN) 10 MG tablet Take 10 mg by mouth daily.     OVER THE COUNTER MEDICATION Deep Blue Polyphenol     polyethylene glycol powder (GLYCOLAX/MIRALAX) 17 GM/SCOOP powder 17 g once or twice daily for  constipation. 500 g 0   pravastatin (PRAVACHOL) 40 MG tablet TAKE ONE TABLET BY MOUTH EVERY EVENING 90 tablet 3   No current facility-administered medications on file prior to visit.    Allergies  Allergen Reactions   Anesthetics, Amide Nausea Only    Other reaction(s): Vomiting After surgery patient has problems with nausea and vomiting due to anesthetics   Other Nausea Only and Nausea And Vomiting    After surgery patient has problems with nausea and vomiting due to anesthetics   Atorvastatin     REACTION: myalgias   Oxycodone Nausea And Vomiting   Simvastatin     REACTION: joint and stomach pain   Latex Rash    Local area  (gloves - when worn)   Statins Other (See Comments)    Joint pain    Past Medical History:  Diagnosis Date   Allergy    Anxiety    Aortic stenosis    Barrett's esophagus    BPH (benign prostatic hypertrophy)    Diaphragm dysfunction    right frozen diaphragm   Diverticulitis    GERD (gastroesophageal reflux disease)    Hiatal hernia    Hx of adenomatous colonic polyps    Hyperlipidemia    Kidney stones    Obstructive sleep apnea    BiPAP   Osteoarthritis    Osteoporosis  PONV (postoperative nausea and vomiting)    Spinal stenosis     Past Surgical History:  Procedure Laterality Date   CATARACT EXTRACTION W/PHACO Right 04/20/2020   Procedure: CATARACT EXTRACTION PHACO AND INTRAOCULAR LENS PLACEMENT ([facility]) RIGHT;  Surgeon: Eulogio Bear, MD;  Location: [place];  Service: Ophthalmology;  Laterality: Right;  5.21 0:38.6   CATARACT EXTRACTION W/PHACO Left 05/11/2020   Procedure: CATARACT EXTRACTION PHACO AND INTRAOCULAR LENS PLACEMENT ([facility]) LEFT;  Surgeon: Eulogio Bear, MD;  Location: [place];  Service: Ophthalmology;  Laterality: Left;  4.15 0:30.7   ESOPHAGOGASTRODUODENOSCOPY     colon--6/04, Barrett's--7/05, Barrett's but no cancer--4/08   EYE MUSCLE SURGERY     L eye muscle surgery--2/01   JOINT  REPLACEMENT  1/12   left TKR   KNEE ARTHROSCOPY     7/ 09 Arthroscopy left knee--Dr Noemi Chapel   LIPOMA EXCISION Left 2010   compressing nerve by spine---Dr Terald Sleeper at [place]     R rotator cuff repair--1/00  Noemi Chapel)   ROTATOR CUFF REPAIR Left ~2005 & 11/15   biceps repair also in 2015   TENDON REPAIR     Left knee tendon repair--1966   TOTAL KNEE ARTHROPLASTY Right 4/16   VASECTOMY     Vasectomy/spermatocele/?testicular cyst--1970    Family History  Problem Relation Age of Onset   Hypertension Mother    Breast cancer Mother    Colon polyps Mother    Coronary artery disease Mother    Diabetes Neg Hx    Colon cancer Neg Hx    Esophageal cancer Neg Hx    Gallbladder disease Neg Hx    Kidney disease Neg Hx     Social History   Socioeconomic History   Marital status: Married    Spouse name: Not on file   Number of children: 3   Years of education: Not on file   Highest education level: Not on file  Occupational History   Occupation: retired    Fish farm manager: RETIRED  Tobacco Use   Smoking status: Never   Smokeless tobacco: Never  Vaping Use   Vaping Use: Never used  Substance and Sexual Activity   Alcohol use: Not Currently    Alcohol/week: 0.0 standard drinks    Comment: occassional wine   Drug use: No   Sexual activity: Not on file  Other Topics Concern   Not on file  Social History Narrative   Lives at home with wife      Has living will   Wife is health care POA---alternate daughter [name]   Would accept resuscitation attempts   No tube feeds if cognitively unaware         Social Determinants of Health   Financial Resource Strain: Not on file  Food Insecurity: Not on file  Transportation Needs: Not on file  Physical Activity: Not on file  Stress: Not on file  Social Connections: Not on file  Intimate Partner Violence: Not on file   Review of Systems Sleeping okay Some trouble with stability of legs--but no overt weakness      Objective:   Physical Exam Constitutional:      Appearance: Normal appearance.  Musculoskeletal:     Comments: Mild tenderness in left shoulder---anterior and posterior Limited external rotation and abduction  Neurological:     Mental Status: He is alert.           Assessment & Plan:

## 2021-07-03 ENCOUNTER — Other Ambulatory Visit: Payer: Self-pay | Admitting: Internal Medicine

## 2021-07-04 NOTE — Telephone Encounter (Signed)
LAst filled 06-04-21 #30 Last OV 07-02-21 Next OV 04-12-22 [facility]

## 2021-07-08 ENCOUNTER — Telehealth: Payer: Self-pay

## 2021-07-08 NOTE — Progress Notes (Addendum)
? ? ?  Chronic Care Management ?Pharmacy Assistant  ? ?Name: [name]  MRN: [id number] DOB: 02-Nov-1940 ? ?Patient declined any CCM services.  Appointment has been cancelled ?

## 2021-07-09 DIAGNOSIS — M48062 Spinal stenosis, lumbar region with neurogenic claudication: Secondary | ICD-10-CM | POA: Diagnosis not present

## 2021-07-09 DIAGNOSIS — M5136 Other intervertebral disc degeneration, lumbar region: Secondary | ICD-10-CM | POA: Diagnosis not present

## 2021-07-09 DIAGNOSIS — M5416 Radiculopathy, lumbar region: Secondary | ICD-10-CM | POA: Diagnosis not present

## 2021-07-12 ENCOUNTER — Telehealth: Payer: Medicare Other

## 2021-07-21 ENCOUNTER — Ambulatory Visit (INDEPENDENT_AMBULATORY_CARE_PROVIDER_SITE_OTHER): Payer: Medicare Other | Admitting: Primary Care

## 2021-07-21 ENCOUNTER — Other Ambulatory Visit: Payer: Self-pay

## 2021-07-21 ENCOUNTER — Encounter: Payer: Self-pay | Admitting: Primary Care

## 2021-07-21 DIAGNOSIS — R351 Nocturia: Secondary | ICD-10-CM | POA: Diagnosis not present

## 2021-07-21 DIAGNOSIS — N401 Enlarged prostate with lower urinary tract symptoms: Secondary | ICD-10-CM | POA: Diagnosis not present

## 2021-07-21 DIAGNOSIS — J986 Disorders of diaphragm: Secondary | ICD-10-CM | POA: Insufficient documentation

## 2021-07-21 DIAGNOSIS — G4733 Obstructive sleep apnea (adult) (pediatric): Secondary | ICD-10-CM

## 2021-07-21 DIAGNOSIS — Z87440 Personal history of urinary (tract) infections: Secondary | ICD-10-CM | POA: Diagnosis not present

## 2021-07-21 NOTE — Patient Instructions (Addendum)
Recommendations: ?Continue to stay active and work on weight loss ?Continue to use BIPAP every night  ?Do not drive if tired  ? ?Orders: ?PFTs re: shortness of breath ?CXR re: shortness of breath  ? ?Refer: ?CT surgery re: paralyzed diaphragm ? ?Follow-up: ?3 months with Dr. Mortimer Fries ? ? ?

## 2021-07-21 NOTE — Assessment & Plan Note (Signed)
-   Chronic dyspnea, no worse. We will check CXR today and get PFTs. Refer to cardiothoracic surgeon to discuss his options.  ?

## 2021-07-21 NOTE — Progress Notes (Signed)
? ?'@Patient'$  ID: [name], male    DOB: 1941-03-26, 81 y.o.   MRN: [id number] ? ?No chief complaint on file. ? ? ?Referring provider: ?Venia Carbon, MD ? ?HPI: ?81 year old male, never smoked. PMH significant for OSA, paralyzed hemidiaphragm. Patient of Dr. Mortimer Fries, last seen in 2019.  ? ? ?07/21/2021 ?Patient presents today for need new sleep consult.  Patient previously followed by Dr. Mortimer Fries, last seen in office in December 2019 for OSA.  He is currently maintained on BiPAP.  He has a history of a paralyzed diaphragm, first diagnosed 30-40 years ago. He complains of chronic dyspnea symptoms. His symptoms have not worsened recently. He recover with rest quickly. He is very active. He likes to work and fish.  ? ?He is complaint with BIPAP, no issues with current machine, pressure setting or mask fit. He uses full face mask. Typical bedtime is between 9:30-11pm. It takes him less than 5 minutes to fall asleep. He starts his day at 6:30am.  [facility], he calls when he needs new supplies.  ? ?[facility] download 06/20/21-07/19/21 ?30/30 days used (100%) ?Average 8 hours 11 mins  ?Pressure 18/4 ?AHI 2.1  ? ? ?Allergies  ?Allergen Reactions  ? Anesthetics, Amide Nausea Only  ?  Other reaction(s): Vomiting ?After surgery patient has problems with nausea and vomiting due to anesthetics  ? Other Nausea Only and Nausea And Vomiting  ?  After surgery patient has problems with nausea and vomiting due to anesthetics  ? Atorvastatin   ?  REACTION: myalgias  ? Oxycodone Nausea And Vomiting  ? Simvastatin   ?  REACTION: joint and stomach pain  ? Latex Rash  ?  Local area  (gloves - when worn)  ? Statins Other (See Comments)  ?  Joint pain  ? ? ?Immunization History  ?Administered Date(s) Administered  ? Influenza Split 04/21/2011  ? Influenza Whole 03/02/2007, 04/07/2009, 05/21/2010  ? Influenza,inj,Quad PF,6+ Mos 03/12/2015  ? Pneumococcal Conjugate-13 08/20/2013  ? Pneumococcal Polysaccharide-23 04/07/2009,  12/28/2016  ? Td 09/15/2004, 02/06/2017  ? Zoster, Live 01/19/2012  ? ? ?Past Medical History:  ?Diagnosis Date  ? Allergy   ? Anxiety   ? Aortic stenosis   ? Barrett's esophagus   ? BPH (benign prostatic hypertrophy)   ? Diaphragm dysfunction   ? right frozen diaphragm  ? Diverticulitis   ? GERD (gastroesophageal reflux disease)   ? Hiatal hernia   ? Hx of adenomatous colonic polyps   ? Hyperlipidemia   ? Kidney stones   ? Obstructive sleep apnea   ? BiPAP  ? Osteoarthritis   ? Osteoporosis   ? PONV (postoperative nausea and vomiting)   ? Spinal stenosis   ? ? ?Tobacco History: ?Social History  ? ?Tobacco Use  ?Smoking Status Never  ?Smokeless Tobacco Never  ? ?Counseling given: Not Answered ? ? ?Outpatient Medications Prior to Visit  ?Medication Sig Dispense Refill  ? acetaminophen (TYLENOL) 500 MG tablet Take 500 mg by mouth every 8 (eight) hours as needed (for pain).     ? alfuzosin (UROXATRAL) 10 MG 24 hr tablet Take 1 tablet (10 mg total) by mouth daily. 90 tablet 3  ? ALPRAZolam (XANAX) 0.25 MG tablet TAKE 1 TABLET BY MOUTH AT BEDTIME AS NEEDED FOR ANXIETY 30 tablet 0  ? aspirin 81 MG tablet Take 81 mg by mouth every other day.    ? Black Pepper-Turmeric (TURMERIC COMPLEX/BLACK PEPPER PO) Take by mouth.    ? doxazosin (  CARDURA) 4 MG tablet Take 4 mg by mouth daily.    ? esomeprazole (NEXIUM) 40 MG capsule TAKE ONE CAPSULE BY MOUTH EVERY MORNING BEFORE BREAKFAST 90 capsule 3  ? finasteride (PROSCAR) 5 MG tablet Take 1 tablet (5 mg total) by mouth daily. 90 tablet 3  ? loratadine (CLARITIN) 10 MG tablet Take 10 mg by mouth daily.    ? meloxicam (MOBIC) 15 MG tablet Take 1 tablet (15 mg total) by mouth daily as needed for pain. 90 tablet 3  ? OVER THE COUNTER MEDICATION Deep Blue Polyphenol    ? polyethylene glycol powder (GLYCOLAX/MIRALAX) 17 GM/SCOOP powder 17 g once or twice daily for constipation. 500 g 0  ? pravastatin (PRAVACHOL) 40 MG tablet TAKE ONE TABLET BY MOUTH EVERY EVENING 90 tablet 3  ? ?No  facility-administered medications prior to visit.  ? ? ? ? ?Review of Systems ? ?Review of Systems  ?Constitutional: Negative.   ?HENT: Negative.    ?Respiratory:  Positive for shortness of breath. Negative for cough and chest tightness.   ?Cardiovascular: Negative.   ? ? ?Physical Exam ? ?BP 120/70 (BP Location: Left Arm, Patient Position: Sitting, Cuff Size: Normal)   Pulse 76   Temp (!) 97.2 ?F (36.2 ?C) (Oral)   Ht 5' 7.5" (1.715 m)   Wt 215 lb (97.5 kg)   SpO2 98%   BMI 33.18 kg/m?  ?Physical Exam ?Constitutional:   ?   Appearance: Normal appearance.  ?HENT:  ?   Head: Normocephalic and atraumatic.  ?Cardiovascular:  ?   Rate and Rhythm: Normal rate and regular rhythm.  ?Pulmonary:  ?   Effort: Pulmonary effort is normal.  ?   Breath sounds: Normal breath sounds.  ?Musculoskeletal:     ?   General: Normal range of motion.  ?Skin: ?   General: Skin is warm and dry.  ?Neurological:  ?   General: No focal deficit present.  ?   Mental Status: He is alert and oriented to person, place, and time. Mental status is at baseline.  ?  ? ?Lab Results: ? ?CBC ?   ?Component Value Date/Time  ? WBC 6.0 03/12/2021 1019  ? RBC 4.49 03/12/2021 1019  ? HGB 12.8 (L) 03/12/2021 1019  ? HCT 39.4 03/12/2021 1019  ? PLT 234.0 03/12/2021 1019  ? MCV 87.7 03/12/2021 1019  ? MCH 29.7 12/22/2017 0101  ? MCHC 32.4 03/12/2021 1019  ? RDW 14.1 03/12/2021 1019  ? LYMPHSABS 1.7 12/28/2016 1051  ? MONOABS 0.7 12/28/2016 1051  ? EOSABS 0.4 12/28/2016 1051  ? BASOSABS 0.1 12/28/2016 1051  ? ? ?BMET ?   ?Component Value Date/Time  ? NA 138 03/12/2021 1019  ? NA 139 12/21/2018 0930  ? K 4.5 03/12/2021 1019  ? CL 101 03/12/2021 1019  ? CO2 29 03/12/2021 1019  ? GLUCOSE 86 03/12/2021 1019  ? BUN 24 (H) 03/12/2021 1019  ? BUN 18 12/21/2018 0930  ? CREATININE 1.05 03/12/2021 1019  ? CALCIUM 9.1 03/12/2021 1019  ? GFRNONAA 73 12/21/2018 0930  ? GFRAA 85 12/21/2018 0930  ? ? ?BNP ?No results found for: BNP ? ?ProBNP ?No results found for:  PROBNP ? ?Imaging: ?No results found. ? ? ?Assessment & Plan:  ? ?Paralyzed hemidiaphragm ?- Chronic dyspnea, no worse. We will check CXR today and get PFTs. Refer to cardiothoracic surgeon to discuss his options.  ? ?Obstructive sleep apnea ?- Patient is 100% compliant with BIPAP use. Pressure 18/4. Residual AHI 2.1. No changes  today. Renew supplies with DME company.  ? ? ?Martyn Ehrich, NP ?07/21/2021 ? ?

## 2021-07-21 NOTE — Assessment & Plan Note (Signed)
-   Patient is 100% compliant with BIPAP use. Pressure 18/4. Residual AHI 2.1. No changes today. Renew supplies with DME company.  ?

## 2021-07-27 DIAGNOSIS — M48062 Spinal stenosis, lumbar region with neurogenic claudication: Secondary | ICD-10-CM | POA: Diagnosis not present

## 2021-07-27 DIAGNOSIS — M5416 Radiculopathy, lumbar region: Secondary | ICD-10-CM | POA: Diagnosis not present

## 2021-07-28 ENCOUNTER — Telehealth: Payer: Self-pay

## 2021-07-28 DIAGNOSIS — J986 Disorders of diaphragm: Secondary | ICD-10-CM

## 2021-07-28 DIAGNOSIS — R942 Abnormal results of pulmonary function studies: Secondary | ICD-10-CM

## 2021-07-28 NOTE — Telephone Encounter (Signed)
-----   Message from Tana [place] sent at 07/28/2021 11:04 AM EDT ----- ?Regarding: FW: referral ?Contact: [phone] ?They still need the things below ordered and scheduled for the patient before they will see them ? ?Rodena Piety ?----- Message ----- ?From: Marylen Ponto, LPN ?Sent: 07/28/2021  10:23 AM EDT ?To: Melvia Heaps, NP ?Subject: referral                                      ? ?Hi Anita.  ?Dr Kipp Brood review Mr Seelinger's chart and said he would need a /sniff test/ chest CT and PFT's prior to scheduling an appt to discuss surgery. Can your office please set all this up and I will be able to schedule him. Thanks SW ? ? ?

## 2021-07-28 NOTE — Telephone Encounter (Signed)
Yes, please use paralyzed of hemidiaphragm

## 2021-07-28 NOTE — Telephone Encounter (Signed)
Patient is aware of need for CT, sniff test and PFT. He voiced his understanding.  ?Orders placed.  ?Nothing further needed.  ? ?

## 2021-07-28 NOTE — Telephone Encounter (Signed)
[name], please see below message and advise if okay to order requested test. Thanks ?

## 2021-07-30 ENCOUNTER — Other Ambulatory Visit: Payer: Self-pay

## 2021-07-30 ENCOUNTER — Ambulatory Visit: Payer: Medicare Other | Attending: Primary Care

## 2021-07-30 DIAGNOSIS — J986 Disorders of diaphragm: Secondary | ICD-10-CM | POA: Insufficient documentation

## 2021-07-30 MED ORDER — ALBUTEROL SULFATE (2.5 MG/3ML) 0.083% IN NEBU
2.5000 mg | INHALATION_SOLUTION | Freq: Once | RESPIRATORY_TRACT | Status: AC
  Administered 2021-07-30: 2.5 mg via RESPIRATORY_TRACT
  Filled 2021-07-30: qty 3

## 2021-08-03 ENCOUNTER — Other Ambulatory Visit: Payer: Self-pay

## 2021-08-03 ENCOUNTER — Ambulatory Visit
Admission: RE | Admit: 2021-08-03 | Discharge: 2021-08-03 | Disposition: A | Discharge status: Home / Self Care | Payer: Medicare Other | Source: Ambulatory Visit | Attending: Primary Care | Admitting: Primary Care

## 2021-08-03 DIAGNOSIS — J986 Disorders of diaphragm: Secondary | ICD-10-CM | POA: Insufficient documentation

## 2021-08-03 DIAGNOSIS — Z01818 Encounter for other preprocedural examination: Secondary | ICD-10-CM | POA: Diagnosis not present

## 2021-08-05 ENCOUNTER — Ambulatory Visit
Admission: RE | Admit: 2021-08-05 | Discharge: 2021-08-05 | Disposition: A | Discharge status: Home / Self Care | Payer: Medicare Other | Source: Ambulatory Visit | Attending: Primary Care | Admitting: Primary Care

## 2021-08-05 DIAGNOSIS — J986 Disorders of diaphragm: Secondary | ICD-10-CM | POA: Insufficient documentation

## 2021-08-05 DIAGNOSIS — M47812 Spondylosis without myelopathy or radiculopathy, cervical region: Secondary | ICD-10-CM | POA: Diagnosis not present

## 2021-08-05 DIAGNOSIS — R942 Abnormal results of pulmonary function studies: Secondary | ICD-10-CM | POA: Diagnosis not present

## 2021-08-05 DIAGNOSIS — I288 Other diseases of pulmonary vessels: Secondary | ICD-10-CM | POA: Diagnosis not present

## 2021-08-05 DIAGNOSIS — I700 Atherosclerosis of aorta: Secondary | ICD-10-CM | POA: Diagnosis not present

## 2021-08-05 DIAGNOSIS — I2510 Atherosclerotic heart disease of native coronary artery without angina pectoris: Secondary | ICD-10-CM | POA: Diagnosis not present

## 2021-08-13 NOTE — Progress Notes (Signed)
Patient had CT chest and PFTs per CT surgery request, he has an apt with them end of April. Nothing further needed.  ? ?CT showed known elevation right hemidiaphragm. PFTs showed moderate restriction.

## 2021-08-16 ENCOUNTER — Encounter: Payer: Self-pay | Admitting: Cardiology

## 2021-08-17 ENCOUNTER — Other Ambulatory Visit: Payer: Self-pay | Admitting: Internal Medicine

## 2021-08-17 NOTE — Telephone Encounter (Signed)
Is this okay to refill? 

## 2021-08-25 ENCOUNTER — Ambulatory Visit (INDEPENDENT_AMBULATORY_CARE_PROVIDER_SITE_OTHER): Payer: Medicare Other | Admitting: Thoracic Surgery (Cardiothoracic Vascular Surgery)

## 2021-08-25 DIAGNOSIS — J986 Disorders of diaphragm: Secondary | ICD-10-CM

## 2021-08-25 DIAGNOSIS — T1500XA Foreign body in cornea, unspecified eye, initial encounter: Secondary | ICD-10-CM | POA: Diagnosis not present

## 2021-08-25 NOTE — Progress Notes (Deleted)
[address]       [place] [address]             [phone]                    Barnard L Hilaire  Medical Record #[id number] Date of Birth: 1940/12/31  Referring: Venia Carbon, MD Primary Care: Venia Carbon, MD Primary Cardiologist: Minus Breeding, MD  Chief Complaint:   No chief complaint on file.   History of Present Illness:    [name] 81 y.o. male ***    Past Medical History:  Diagnosis Date   Allergy    Anxiety    Aortic stenosis    Barrett's esophagus    BPH (benign prostatic hypertrophy)    Diaphragm dysfunction    right frozen diaphragm   Diverticulitis    GERD (gastroesophageal reflux disease)    Hiatal hernia    Hx of adenomatous colonic polyps    Hyperlipidemia    Kidney stones    Obstructive sleep apnea    BiPAP   Osteoarthritis    Osteoporosis    PONV (postoperative nausea and vomiting)    Spinal stenosis     Past Surgical History:  Procedure Laterality Date   CATARACT EXTRACTION W/PHACO Right 04/20/2020   Procedure: CATARACT EXTRACTION PHACO AND INTRAOCULAR LENS PLACEMENT ([place]) RIGHT;  Surgeon: Eulogio Bear, MD;  Location: [facility];  Service: Ophthalmology;  Laterality: Right;  5.21 0:38.6   CATARACT EXTRACTION W/PHACO Left 05/11/2020   Procedure: CATARACT EXTRACTION PHACO AND INTRAOCULAR LENS PLACEMENT ([place]) LEFT;  Surgeon: Eulogio Bear, MD;  Location: [facility];  Service: Ophthalmology;  Laterality: Left;  4.15 0:30.7   ESOPHAGOGASTRODUODENOSCOPY     colon--6/04, Barrett's--7/05, Barrett's but no cancer--4/08   EYE MUSCLE SURGERY     L eye muscle surgery--2/01   JOINT REPLACEMENT  1/12   left TKR   KNEE ARTHROSCOPY     7/ 09 Arthroscopy left knee--Dr Noemi Chapel   LIPOMA EXCISION Left 2010   compressing nerve by spine---Dr Terald Sleeper at [facility] [place]     R rotator cuff repair--1/00  Noemi Chapel)   ROTATOR CUFF REPAIR Left ~2005 & 11/15   biceps repair  also in 2015   TENDON REPAIR     Left knee tendon repair--1966   TOTAL KNEE ARTHROPLASTY Right 4/16   VASECTOMY     Vasectomy/spermatocele/?testicular cyst--1970    Family History  Problem Relation Age of Onset   Hypertension Mother    Breast cancer Mother    Colon polyps Mother    Coronary artery disease Mother    Diabetes Neg Hx    Colon cancer Neg Hx    Esophageal cancer Neg Hx    Gallbladder disease Neg Hx    Kidney disease Neg Hx      Social History   Tobacco Use  Smoking Status Never  Smokeless Tobacco Never    Social History   Substance and Sexual Activity  Alcohol Use Not Currently   Alcohol/week: 0.0 standard drinks   Comment: occassional wine     Allergies  Allergen Reactions   Anesthetics, Amide Nausea Only    Other reaction(s): Vomiting After surgery patient has problems with nausea and vomiting due to anesthetics   Other Nausea Only and Nausea And Vomiting    After surgery patient has problems with nausea and vomiting due to anesthetics   Atorvastatin  REACTION: myalgias   Oxycodone Nausea And Vomiting   Simvastatin     REACTION: joint and stomach pain   Latex Rash    Local area  (gloves - when worn)   Statins Other (See Comments)    Joint pain    Current Outpatient Medications  Medication Sig Dispense Refill   acetaminophen (TYLENOL) 500 MG tablet Take 500 mg by mouth every 8 (eight) hours as needed (for pain).      alfuzosin (UROXATRAL) 10 MG 24 hr tablet Take 1 tablet (10 mg total) by mouth daily. 90 tablet 3   ALPRAZolam (XANAX) 0.25 MG tablet TAKE 1 TABLET BY MOUTH AT BEDTIME AS NEEDED FOR ANXIETY 30 tablet 0   aspirin 81 MG tablet Take 81 mg by mouth every other day.     Black Pepper-Turmeric (TURMERIC COMPLEX/BLACK PEPPER PO) Take by mouth.     doxazosin (CARDURA) 4 MG tablet Take 4 mg by mouth daily.     esomeprazole (NEXIUM) 40 MG capsule TAKE ONE CAPSULE BY MOUTH EVERY MORNING BEFORE BREAKFAST 90 capsule 3   finasteride  (PROSCAR) 5 MG tablet Take 1 tablet (5 mg total) by mouth daily. 90 tablet 3   loratadine (CLARITIN) 10 MG tablet Take 10 mg by mouth daily.     meloxicam (MOBIC) 15 MG tablet Take 1 tablet (15 mg total) by mouth daily as needed for pain. 90 tablet 3   OVER THE COUNTER MEDICATION Deep Blue Polyphenol     polyethylene glycol powder (GLYCOLAX/MIRALAX) 17 GM/SCOOP powder 17 g once or twice daily for constipation. 500 g 0   pravastatin (PRAVACHOL) 40 MG tablet TAKE ONE TABLET BY MOUTH EVERY EVENING 90 tablet 3   No current facility-administered medications for this visit.    ROS  PHYSICAL EXAMINATION: There were no vitals taken for this visit.  Physical Exam   Diagnostic Studies & Laboratory data:     Recent Radiology Findings:   CT CHEST WO CONTRAST  Result Date: 08/07/2021 CLINICAL DATA:  Elevated right hemidiaphragm, paradoxical movement of right hemidiaphragm EXAM: CT CHEST WITHOUT CONTRAST TECHNIQUE: Multidetector CT imaging of the chest was performed following the standard protocol without IV contrast. RADIATION DOSE REDUCTION: This exam was performed according to the departmental dose-optimization program which includes automated exposure control, adjustment of the mA and/or kV according to patient size and/or use of iterative reconstruction technique. COMPARISON:  CT done on 01/01/2019 FINDINGS: Cardiovascular: Coronary artery calcifications are seen. There are scattered calcifications in the thoracic aorta. There is ectasia of ascending thoracic aorta measuring 4.4 cm. There is ectasia of main pulmonary artery measuring 3.6 cm, possibly suggesting pulmonary arterial hypertension. Mediastinum/Nodes: No new significant lymphadenopathy is seen. Lungs/Pleura: There is marked elevation of right hemidiaphragm suggesting possible diaphragmatic paralysis. Small linear densities in the right lower lung fields may suggest subsegmental atelectasis. There are no new infiltrates or new nodules in the  lung fields. There is no pleural effusion or pneumothorax. Upper Abdomen: Gallbladder stones are seen. There is interposition of colon between the liver and anterior abdominal wall. Musculoskeletal: Degenerative changes are noted in the lower cervical spine and lower thoracic spine. IMPRESSION: There is marked elevation of right hemidiaphragm with no significant interval change since 01/01/2019 suggesting possible diaphragmatic paralysis. There are small linear densities in the right lower lung fields suggesting scarring or subsegmental atelectasis. There are no new focal pulmonary infiltrates or lung nodules. There is no significant lymphadenopathy or other mediastinal mass lesions. There is ectasia of main pulmonary artery suggesting  pulmonary arterial hypertension. There is ectasia of ascending thoracic aorta measuring 4.4 cm. Coronary artery calcifications are seen.Gallbladder stones. Electronically Signed   By: Elmer Picker M.D.   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 EXAM: CHEST FLUOROSCOPY TECHNIQUE: Real-time fluoroscopic evaluation of the chest was performed. FLUOROSCOPY: Radiation Exposure Index (as provided by the fluoroscopic device): 2.30 mGy Kerma COMPARISON:  Sniff test Sep 10, 2015 FINDINGS: Paradoxical motion of the right hemidiaphragm was observed during rapid inspiration (sniffs). IMPRESSION: There is elevation of the right hemidiaphragm and paradoxical motion of the right hemidiaphragm during rapid inspiration. This exam was performed by Tsosie Billing PA-C, and was supervised and interpreted by Dr. Maree Erie. Electronically Signed   By: Nelson Chimes M.D.   On: 08/03/2021 14:25   Pulmonary Function Test [facility] Only  Result Date: 08/07/2021 Spirometry Data Is Acceptable and Reproducible Moderate Restrictive Lung disease without   Significant Broncho-Dilator Response +air trapping (increased RV) Consider outpatient Pulmonary Consultation if needed Clinical Correlation Advised       I have independently reviewed the above radiology studies  and reviewed the findings with the patient.   Recent Lab Findings: Lab Results  Component Value Date   WBC 6.0 03/12/2021   HGB 12.8 (L) 03/12/2021   HCT 39.4 03/12/2021   PLT 234.0 03/12/2021   GLUCOSE 86 03/12/2021   CHOL 184 03/12/2021   TRIG 76.0 03/12/2021   HDL 43.80 03/12/2021   LDLDIRECT 157.4 08/16/2012   LDLCALC 125 (H) 03/12/2021   ALT 14 03/12/2021   AST 18 03/12/2021   NA 138 03/12/2021   K 4.5 03/12/2021   CL 101 03/12/2021   CREATININE 1.05 03/12/2021   BUN 24 (H) 03/12/2021   CO2 29 03/12/2021   TSH 1.18 08/20/2013   INR 1.02 05/25/2010            FINDINGS: Paradoxical motion of the right hemidiaphragm was observed during rapid inspiration (sniffs).   IMPRESSION: There is elevation of the right hemidiaphragm and paradoxical motion of the right hemidiaphragm during rapid inspiration.   Latest Reference Range & Units 05/29/15 13:18  FVC-Pre L 2.21 (P)  FVC-%Pred-Pre % 52 (P)  FEV1-Pre L 1.84 (P)  FEV1-%Pred-Pre % 60 (P)  Pre FEV1/FVC ratio % 83 (P)  FEV1FVC-%Pred-Pre % 113 (P)  FEF 25-75 Pre L/sec 1.97 (P)  FEF2575-%Pred-Pre % 88 (P)  FEV6-Pre L 2.21 (P)  FEV6-%Pred-Pre % 56 (P)  Pre FEV6/FVC Ratio % 100 (P)  FEV6FVC-%Pred-Pre % 106 (P)  FVC-Post L 2.31 (P)  FVC-%Pred-Post % 55 (P)  FVC-%Change-Post % 4 (P)  FEV1-Post L 1.94 (P)  FEV1-%Pred-Post % 63 (P)  FEV1-%Change-Post % 5 (P)  Post FEV1/FVC ratio % 84 (P)  FEV1FVC-%Change-Post % 0 (P)  FEF 25-75 Post L/sec 2.41 (P)  FEF2575-%Pred-Post % 108 (P)  FEF2575-%Change-Post % 22 (P)  FEV6-Post L 2.31 (P)  FEV6-%Pred-Post % 58 (P)  FEV6-%Change-Post % 4 (P)  Post FEV6/FVC ratio % 100 (P)  FEV6FVC-%Pred-Post % 106 (P)  TLC L 9.46 (P)  TLC % pred % 136 (P)  RV L 6.79 (P)   RV % pred % 271 (P)  DLCO unc ml/min/mmHg 21.12 (P)  DLCO unc % pred % 66 (P)  DL/VA ml/min/mmHg/L 4.85 (P)  DL/VA % pred % 106 (P)    Assessment / Plan:   81 year old male with chronically elevated  right hemidiaphragm no evidence of paralysis next of test.  We will review his imaging dating back to 2011 his diaphragm was elevated at that point.  On review of his pulmonary function testing he does have some restrictive lung disease.  Will discuss what is changed over the past 12 years as to why he would consider surgical intervention at his current age.      Lajuana Matte 08/25/2021 1:17 PM

## 2021-08-26 DIAGNOSIS — Z20822 Contact with and (suspected) exposure to covid-19: Secondary | ICD-10-CM | POA: Diagnosis not present

## 2021-08-26 DIAGNOSIS — G4733 Obstructive sleep apnea (adult) (pediatric): Secondary | ICD-10-CM | POA: Diagnosis not present

## 2021-08-26 DIAGNOSIS — H6123 Impacted cerumen, bilateral: Secondary | ICD-10-CM | POA: Diagnosis not present

## 2021-08-26 DIAGNOSIS — J392 Other diseases of pharynx: Secondary | ICD-10-CM | POA: Diagnosis not present

## 2021-08-27 ENCOUNTER — Encounter: Payer: Medicare Other | Admitting: Thoracic Surgery (Cardiothoracic Vascular Surgery)

## 2021-08-27 DIAGNOSIS — M48062 Spinal stenosis, lumbar region with neurogenic claudication: Secondary | ICD-10-CM | POA: Diagnosis not present

## 2021-08-27 DIAGNOSIS — M47816 Spondylosis without myelopathy or radiculopathy, lumbar region: Secondary | ICD-10-CM | POA: Diagnosis not present

## 2021-08-27 DIAGNOSIS — M5416 Radiculopathy, lumbar region: Secondary | ICD-10-CM | POA: Diagnosis not present

## 2021-08-27 DIAGNOSIS — M5136 Other intervertebral disc degeneration, lumbar region: Secondary | ICD-10-CM | POA: Diagnosis not present

## 2021-08-27 NOTE — Progress Notes (Signed)
?   ?  [address] ?      [place] [address] ?            [phone]      ? ?Patient: Home ?Provider: Office ?Consent for Telemedicine visit obtained. ? ?Today?s visit was completed via a real-time telehealth (see specific modality noted below). The patient/authorized person provided oral consent at the time of the visit to engage in a telemedicine encounter with the present provider at [facility]. The patient/authorized person was informed of the potential benefits, limitations, and risks of telemedicine. The patient/authorized person expressed understanding that the laws that protect confidentiality also apply to telemedicine. The patient/authorized person acknowledged understanding that telemedicine does not provide emergency services and that he or she would need to call 911 or proceed to the nearest hospital for help if such a need arose. ? ? Total time spent in the clinical discussion 10 minutes. ? Telehealth Modality: Phone visit (audio only) ? ?I had a telephone visit with Mr. Nong.  I had a long discussion with him in regards to his symptoms and history of his diaphragm paralysis.  He states that he has known about this for the last 30 to 40 years.  He denies any significant shortness of breath and is able to do his daily activities without any restrictions.  Given his age, and lack of symptoms I have recommended against any surgical repair of this.  It seems that his main complaint is related to back pain and that is his limiting factor, thus even if we are able to improve his respiratory status he likely would not be able to exercise to any degree where it is noticeable. ? ?He was in agreement with this plan and states that I will contact us if he changes his mind. ? ? ? ?

## 2021-09-01 DIAGNOSIS — M79675 Pain in left toe(s): Secondary | ICD-10-CM | POA: Diagnosis not present

## 2021-09-01 DIAGNOSIS — B351 Tinea unguium: Secondary | ICD-10-CM | POA: Diagnosis not present

## 2021-09-01 DIAGNOSIS — M79674 Pain in right toe(s): Secondary | ICD-10-CM | POA: Diagnosis not present

## 2021-09-04 DIAGNOSIS — Z20822 Contact with and (suspected) exposure to covid-19: Secondary | ICD-10-CM | POA: Diagnosis not present

## 2021-09-07 DIAGNOSIS — Z20822 Contact with and (suspected) exposure to covid-19: Secondary | ICD-10-CM | POA: Diagnosis not present

## 2021-09-12 DIAGNOSIS — I7789 Other specified disorders of arteries and arterioles: Secondary | ICD-10-CM | POA: Insufficient documentation

## 2021-09-12 DIAGNOSIS — R9431 Abnormal electrocardiogram [ECG] [EKG]: Secondary | ICD-10-CM | POA: Insufficient documentation

## 2021-09-12 NOTE — Progress Notes (Signed)
?  ?Cardiology Office Note ? ? ?Date:  09/14/2021  ? ?ID:  JEET SHOUGH, DOB 1941-04-30, MRN [id number] ? ?PCP:  Venia Carbon, MD  ?Cardiologist:   Minus Breeding, MD ?Referring:  Venia Carbon, MD ? ?Chief Complaint  ?Patient presents with  ? Aortic insufficiency  ? ? ?  ?History of Present Illness: ?[name] is a 81 y.o. male who is seen for follow-up of mild AS, mild/mod AI, and mildly enlarged aortic root.  I have not seen in since 2021.  Since then he has done relatively well.  He does his yard work.  He goes to the gym.  He is mostly limited by some very bad back discomfort.  He does some stretching leg lifting. The patient denies any new symptoms such as chest discomfort, neck or arm discomfort. There has been no new shortness of breath, PND or orthopnea. There have been no reported palpitations, presyncope or syncope.    ? 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  He saw Dr. Kipp Brood and it was decided not to do any surgery on this.  He did have a CT of his chest and he does have an enlarged aorta as below. ? ? ?Past Medical History:  ?Diagnosis Date  ? Allergy   ? Anxiety   ? Aortic stenosis   ? Barrett's esophagus   ? BPH (benign prostatic hypertrophy)   ? Diaphragm dysfunction   ? right frozen diaphragm  ? Diverticulitis   ? GERD (gastroesophageal reflux disease)   ? Hiatal hernia   ? Hx of adenomatous colonic polyps   ? Hyperlipidemia   ? Kidney stones   ? Obstructive sleep apnea   ? BiPAP  ? Osteoarthritis   ? Osteoporosis   ? PONV (postoperative nausea and vomiting)   ? Spinal stenosis   ? ? ?Past Surgical History:  ?Procedure Laterality Date  ? CATARACT EXTRACTION W/PHACO Right 04/20/2020  ? Procedure: CATARACT EXTRACTION PHACO AND INTRAOCULAR LENS PLACEMENT ([place]) RIGHT;  Surgeon: Eulogio Bear, MD;  Location: [facility];  Service: Ophthalmology;  Laterality: Right;  5.21 ?0:38.6  ? CATARACT EXTRACTION W/PHACO Left  05/11/2020  ? Procedure: CATARACT EXTRACTION PHACO AND INTRAOCULAR LENS PLACEMENT ([place]) LEFT;  Surgeon: Eulogio Bear, MD;  Location: [facility];  Service: Ophthalmology;  Laterality: Left;  4.15 ?0:30.7  ? ESOPHAGOGASTRODUODENOSCOPY    ? colon--6/04, Barrett's--7/05, Barrett's but no cancer--4/08  ? EYE MUSCLE SURGERY    ? L eye muscle surgery--2/01  ? JOINT REPLACEMENT  1/12  ? left TKR  ? KNEE ARTHROSCOPY    ? 7/ 09 Arthroscopy left knee--Dr Noemi Chapel  ? LIPOMA EXCISION Left 2010  ? compressing nerve by spine---[name]  ? ROTATOR CUFF REPAIR    ? R rotator cuff repair--1/00  Noemi Chapel)  ? ROTATOR CUFF REPAIR Left ~2005 & 11/15  ? biceps repair also in 2015  ? TENDON REPAIR    ? Left knee tendon repair--1966  ? TOTAL KNEE ARTHROPLASTY Right 4/16  ? VASECTOMY    ? Vasectomy/spermatocele/?testicular cyst--1970  ? ? ? ?Current Outpatient Medications  ?Medication Sig Dispense Refill  ? acetaminophen (TYLENOL) 500 MG tablet Take 500 mg by mouth every 8 (eight) hours as needed (for pain).     ? alfuzosin (UROXATRAL) 10 MG 24 hr tablet Take 1 tablet (10 mg total) by mouth daily. 90 tablet 3  ? ALPRAZolam (XANAX) 0.25 MG tablet TAKE 1  TABLET BY MOUTH AT BEDTIME AS NEEDED FOR ANXIETY 30 tablet 0  ? aspirin 81 MG tablet Take 81 mg by mouth every other day.    ? Black Pepper-Turmeric (TURMERIC COMPLEX/BLACK PEPPER PO) Take by mouth.    ? doxazosin (CARDURA) 4 MG tablet Take 4 mg by mouth daily.    ? esomeprazole (NEXIUM) 40 MG capsule TAKE ONE CAPSULE BY MOUTH EVERY MORNING BEFORE BREAKFAST 90 capsule 3  ? finasteride (PROSCAR) 5 MG tablet Take 1 tablet (5 mg total) by mouth daily. 90 tablet 3  ? loratadine (CLARITIN) 10 MG tablet Take 10 mg by mouth daily.    ? meloxicam (MOBIC) 15 MG tablet Take 1 tablet (15 mg total) by mouth daily as needed for pain. 90 tablet 3  ? OVER THE COUNTER MEDICATION Deep Blue Polyphenol    ? polyethylene glycol powder (GLYCOLAX/MIRALAX) 17 GM/SCOOP powder 17 g once or twice  daily for constipation. 500 g 0  ? pravastatin (PRAVACHOL) 40 MG tablet TAKE ONE TABLET BY MOUTH EVERY EVENING 90 tablet 3  ? ?No current facility-administered medications for this visit.  ? ? ?Allergies:   Anesthetics, amide; Other; Atorvastatin; Oxycodone; Simvastatin; Latex; and Statins  ? ? ?ROS:  Please see the history of present illness.   Otherwise, review of systems are positive for none.   All other systems are reviewed and negative.  ? ? ?PHYSICAL EXAM: ?VS:  BP 126/72   Pulse 89   Ht '5\' 7"'$  (1.702 m)   Wt 217 lb 9.6 oz (98.7 kg)   SpO2 94%   BMI 34.08 kg/m?  , BMI Body mass index is 34.08 kg/m?. ?GENERAL:  Well appearing ?NECK:  No jugular venous distention, waveform within normal limits, carotid upstroke brisk and symmetric, no bruits, no thyromegaly ?LUNGS:  Clear to auscultation bilaterally ?CHEST:  Unremarkable ?HEART:  PMI not displaced or sustained,S1 and S2 within normal limits, no S3, no S4, no clicks, no rubs, soft apical systolic murmur brief and radiating slightly at aortic outflow tract, no diastolic murmurs ?ABD:  Flat, positive bowel sounds normal in frequency in pitch, no bruits, no rebound, no guarding, no midline pulsatile mass, no hepatomegaly, no splenomegaly ?EXT:  2 plus pulses throughout, no edema, no cyanosis no clubbing ? ?EKG:  EKG is  ordered today. ?The ekg ordered today demonstrates sinus rhythm, rate 89, incomplete right bundle branch block, left axis deviation, premature ectopic complexes, no change from previous. ? ? ?Recent Labs: ?03/12/2021: ALT 14; BUN 24; Creatinine, Ser 1.05; Hemoglobin 12.8; Platelets 234.0; Potassium 4.5; Sodium 138  ? ? ?Lipid Panel ?   ?Component Value Date/Time  ? CHOL 184 03/12/2021 1019  ? CHOL 163 12/26/2017 0804  ? TRIG 76.0 03/12/2021 1019  ? HDL 43.80 03/12/2021 1019  ? HDL 45 12/26/2017 0804  ? CHOLHDL 4 03/12/2021 1019  ? VLDL 15.2 03/12/2021 1019  ? LDLCALC 125 (H) 03/12/2021 1019  ? [place] 109 (H) 12/26/2017 0804  ? LDLDIRECT 157.4  08/16/2012 1626  ? ?  ? ?Wt Readings from Last 3 Encounters:  ?09/14/21 217 lb 9.6 oz (98.7 kg)  ?07/21/21 215 lb (97.5 kg)  ?07/02/21 215 lb (97.5 kg)  ?  ? ? ?Other studies Reviewed: ?Additional studies/ records that were reviewed today include: Labs. ?Review of the above records demonstrates: See elsewhere ? ?ASSESSMENT AND PLAN: ? ?AI:   This was mild on echo in 2020.  I will check this with another echocardiogram.  ? ?HTN: Blood pressures is well controlled.  He will continue the meds as listed. ? ?DYSLIPIDEMIA:   LDL was 125 which is much better than previous.  I will defer management to Venia Carbon, MD. he has been intolerant of statins other than the low-dose pravastatin and does not want to try PCSK9.  ? ?AORTIC ROOT ENLARGEMENT:     This was 4.4 cm on CT in March of this year.  I will arrange a follow-up CT next year. ? ?ABNORMAL EKG:   He has no symptomatic bradycardia arrhythmias and we talked about this again this year. ? ?Current medicines are reviewed at length with the patient today.  The patient does not have concerns regarding medicines. ? ?The following changes have been made:  None ? ?Labs/ tests ordered today include:   None ? ?Orders Placed This Encounter  ?Procedures  ? CT ANGIO CHEST AORTA W/CM & OR WO/CM  ? EKG 12-Lead  ? ECHOCARDIOGRAM COMPLETE  ? ? ? ?Disposition:   FU with me in 2 years.    ? ? ?Signed, ?Minus Breeding, MD  ?09/14/2021 3:10 PM    ?[place] ? ? ?

## 2021-09-14 ENCOUNTER — Encounter: Payer: Self-pay | Admitting: Cardiology

## 2021-09-14 ENCOUNTER — Ambulatory Visit (INDEPENDENT_AMBULATORY_CARE_PROVIDER_SITE_OTHER): Payer: Medicare Other | Admitting: Cardiology

## 2021-09-14 VITALS — BP 126/72 | HR 89 | Ht 67.0 in | Wt 217.6 lb

## 2021-09-14 DIAGNOSIS — E785 Hyperlipidemia, unspecified: Secondary | ICD-10-CM

## 2021-09-14 DIAGNOSIS — R9431 Abnormal electrocardiogram [ECG] [EKG]: Secondary | ICD-10-CM

## 2021-09-14 DIAGNOSIS — I10 Essential (primary) hypertension: Secondary | ICD-10-CM | POA: Diagnosis not present

## 2021-09-14 DIAGNOSIS — I351 Nonrheumatic aortic (valve) insufficiency: Secondary | ICD-10-CM

## 2021-09-14 DIAGNOSIS — I7789 Other specified disorders of arteries and arterioles: Secondary | ICD-10-CM | POA: Diagnosis not present

## 2021-09-14 NOTE — Patient Instructions (Signed)
Medication Instructions:  ?No Changes In Medications at this time.  ?*If you need a refill on your cardiac medications before your next appointment, please call your pharmacy* ? ?Lab Work: ?None Ordered At This Time.  ?If you have labs (blood work) drawn today and your tests are completely normal, you will receive your results only by: ?MyChart Message (if you have MyChart) OR ?A paper copy in the mail ?If you have any lab test that is abnormal or we need to change your treatment, we will call you to review the results. ? ?Testing/Procedures: ?Your physician has requested that you have an echocardiogram. Echocardiography is a painless test that uses sound waves to create images of your heart. It provides your doctor with information about the size and shape of your heart and how well your heart?s chambers and valves are working. This procedure takes approximately one hour. There are no restrictions for this procedure. ? ?CTA AORTA- IN MARCH OF NEXT YEAR- SOMEONE WILL REACH OUT TO YOU TO GET THIS SCHEDULED  ? ?Follow-Up: ?At [facility], you and your health needs are our priority.  As part of our continuing mission to provide you with exceptional heart care, we have created designated Provider Care Teams.  These Care Teams include your primary Cardiologist (physician) and Advanced Practice Providers (APPs -  Physician Assistants and Nurse Practitioners) who all work together to provide you with the care you need, when you need it. ? ?Your next appointment:   ?2 year(s) ? ?The format for your next appointment:   ?In Person ? ?Provider:   ?Minus Breeding, MD   ? ? ? ? ? ? ?  ?

## 2021-09-22 ENCOUNTER — Other Ambulatory Visit: Payer: Self-pay | Admitting: Internal Medicine

## 2021-09-23 NOTE — Telephone Encounter (Signed)
I changed them to 0.'5mg'$ --hopefully that is in stock. He can take 1/2 at a time If they don't have that either--send lorazepam 0.5 --1/2 bid prn (#30 x 0)

## 2021-09-23 NOTE — Telephone Encounter (Signed)
Spoke to pt. He will let us know if there a problem with this med.

## 2021-09-23 NOTE — Telephone Encounter (Signed)
Pt called about this medication: ALPRAZolam (XANAX) 0.25 MG tablet, pt stated "he contacted the pharmacy and they said this medication is not in stock for awhile, he wants to know if there is something else." Wanted the nurse and Dr. Silvio Pate to know.   Callback Number: [phone]

## 2021-09-23 NOTE — Telephone Encounter (Signed)
Last filled 08-18-21 #30 Last OV 07-02-21 Next OV 04-12-22 [facility]

## 2021-10-06 ENCOUNTER — Ambulatory Visit (INDEPENDENT_AMBULATORY_CARE_PROVIDER_SITE_OTHER): Payer: Medicare Other | Admitting: Internal Medicine

## 2021-10-06 ENCOUNTER — Encounter: Payer: Self-pay | Admitting: Internal Medicine

## 2021-10-06 VITALS — BP 126/74 | HR 67 | Temp 98.0°F | Ht 67.0 in | Wt 215.6 lb

## 2021-10-06 DIAGNOSIS — G4733 Obstructive sleep apnea (adult) (pediatric): Secondary | ICD-10-CM | POA: Diagnosis not present

## 2021-10-06 NOTE — Patient Instructions (Signed)
A+ keep up the great work  Continue CPAP therapy as prescribed

## 2021-10-06 NOTE — Progress Notes (Signed)
[facility] Consultation      Date: 10/06/2021,   MRN# [id number] KORY RAINS 10-Apr-1941      AdmissionWeight: 215 lb 9.6 oz (97.8 kg)                 CurrentWeight: 215 lb 9.6 oz (97.8 kg) [name] is a 81 y.o. old male seen in consultation for chronic SOB  PFT 05/29/15 fvc 52% fev1 60%, ratio 83% DLCO 66% findings  suggest moderate restrictive lung disease  CHIEF COMPLAINT:   Follow-up OSA Follow-up shortness of breath Diagnosis of right-sided elevated hemidiaphragm   HISTORY OF PRESENT ILLNESS  Follow up chronic SOB Dx with RT sided elevated hemidiaphragm No significant worsening SOB  Walks 3 miles per week 247 pounds now 215 pounds   No exacerbation at this time No evidence of heart failure at this time No evidence or signs of infection at this time No respiratory distress No fevers, chills, nausea, vomiting, diarrhea No evidence of lower extremity edema No evidence hemoptysis  No signs of infection No signs of CHF  Compliance report 100% days and >4hrs AHI 3.7 Doing well with full face mask   BIPAP   MEDICATIONS    Current Medication:  Current Outpatient Medications:    acetaminophen (TYLENOL) 500 MG tablet, Take 500 mg by mouth every 8 (eight) hours as needed (for pain). , Disp: , Rfl:    alfuzosin (UROXATRAL) 10 MG 24 hr tablet, Take 1 tablet (10 mg total) by mouth daily., Disp: 90 tablet, Rfl: 3   ALPRAZolam (XANAX) 0.5 MG tablet, Take 0.5 tablets (0.25 mg total) by mouth 2 (two) times daily as needed for anxiety., Disp: 30 tablet, Rfl: 0   aspirin 81 MG tablet, Take 81 mg by mouth every other day., Disp: , Rfl:    Black Pepper-Turmeric (TURMERIC COMPLEX/BLACK PEPPER PO), Take by mouth., Disp: , Rfl:    doxazosin (CARDURA) 4 MG tablet, Take 4 mg by mouth daily., Disp: , Rfl:    esomeprazole (NEXIUM) 40 MG capsule, TAKE ONE CAPSULE BY MOUTH EVERY MORNING BEFORE BREAKFAST, Disp: 90 capsule, Rfl: 3   finasteride  (PROSCAR) 5 MG tablet, Take 1 tablet (5 mg total) by mouth daily., Disp: 90 tablet, Rfl: 3   loratadine (CLARITIN) 10 MG tablet, Take 10 mg by mouth daily., Disp: , Rfl:    meloxicam (MOBIC) 15 MG tablet, Take 1 tablet (15 mg total) by mouth daily as needed for pain., Disp: 90 tablet, Rfl: 3   OVER THE COUNTER MEDICATION, Deep Blue Polyphenol, Disp: , Rfl:    polyethylene glycol powder (GLYCOLAX/MIRALAX) 17 GM/SCOOP powder, 17 g once or twice daily for constipation., Disp: 500 g, Rfl: 0   pravastatin (PRAVACHOL) 40 MG tablet, TAKE ONE TABLET BY MOUTH EVERY EVENING, Disp: 90 tablet, Rfl: 3    ALLERGIES   Anesthetics, amide; Other; Atorvastatin; Oxycodone; Simvastatin; Latex; and Statins     REVIEW OF SYSTEMS   Review of Systems  Constitutional:  Negative for chills, fever and weight loss.  Eyes: Negative.   Respiratory:  Negative for cough, hemoptysis, sputum production, shortness of breath and wheezing.   Cardiovascular:  Negative for chest pain, orthopnea and leg swelling.  Neurological:  Negative for dizziness.  All other systems reviewed and are negative.   VS: BP 126/74 (BP Location: Left Arm, Cuff Size: Large)   Pulse 67   Temp 98 F (36.7 C) (Temporal)   Ht '5\' 7"'$  (1.702 m)   Wt 215 lb  9.6 oz (97.8 kg)   SpO2 93%   BMI 33.77 kg/m      PHYSICAL EXAM  Physical Exam Constitutional:      General: He is not in acute distress.    Appearance: He is well-developed. He is not diaphoretic.  Cardiovascular:     Rate and Rhythm: Normal rate and regular rhythm.     Heart sounds: Normal heart sounds. No murmur heard. Pulmonary:     Effort: Pulmonary effort is normal. No respiratory distress.     Breath sounds: Normal breath sounds. No wheezing.  Musculoskeletal:     Cervical back: Neck supple.  Neurological:     Mental Status: He is alert and oriented to person, place, and time.     ASSESSMENT/PLAN    81 year old pleasant white male with chronic intermittent SOB  with chronic elevated hemidiaphragm with underlying OSA  He is very compliant with biPAP and patient is using and benefiting from therapy  100% compliance AHI 2.5 biPAP full mask IPAP 18 and EPAP 4  RT sided elevated Hemidiaphragm-no intervention needed at this time  Obesity -recommend significant weight loss -recommend changing diet  Deconditioned state -Recommend increased daily activity and exercise     Patient satisfied with Plan of action and management. All questions answered Follow up in 1 year   Starkeisha Vanwinkle Patricia Pesa, M.D.  [facility]  Medical Director [place] Director [facility] Cardio-Pulmonary Department

## 2021-10-20 ENCOUNTER — Ambulatory Visit (INDEPENDENT_AMBULATORY_CARE_PROVIDER_SITE_OTHER): Payer: Medicare Other

## 2021-10-20 DIAGNOSIS — I351 Nonrheumatic aortic (valve) insufficiency: Secondary | ICD-10-CM

## 2021-10-20 DIAGNOSIS — I7789 Other specified disorders of arteries and arterioles: Secondary | ICD-10-CM

## 2021-10-20 LAB — ECHOCARDIOGRAM COMPLETE
AR max vel: 1.39 cm2
AV Area VTI: 1.5 cm2
AV Area mean vel: 1.41 cm2
AV Mean grad: 17 mmHg
AV Peak grad: 33.6 mmHg
AV Vena cont: 0.3 cm
Ao pk vel: 2.9 m/s
Area-P 1/2: 5.58 cm2
Calc EF: 51.6 %
P 1/2 time: 370 msec
S' Lateral: 2.8 cm
Single Plane A2C EF: 54 %
Single Plane A4C EF: 46.9 %

## 2021-10-21 ENCOUNTER — Encounter: Payer: Self-pay | Admitting: Cardiology

## 2021-10-28 ENCOUNTER — Encounter: Payer: Self-pay | Admitting: *Deleted

## 2021-11-02 ENCOUNTER — Ambulatory Visit (INDEPENDENT_AMBULATORY_CARE_PROVIDER_SITE_OTHER): Payer: Medicare Other | Admitting: Internal Medicine

## 2021-11-02 ENCOUNTER — Encounter: Payer: Self-pay | Admitting: Internal Medicine

## 2021-11-02 VITALS — BP 120/76 | HR 74 | Temp 97.8°F | Ht 67.0 in | Wt 214.0 lb

## 2021-11-02 DIAGNOSIS — I10 Essential (primary) hypertension: Secondary | ICD-10-CM | POA: Diagnosis not present

## 2021-11-02 DIAGNOSIS — M48062 Spinal stenosis, lumbar region with neurogenic claudication: Secondary | ICD-10-CM

## 2021-11-02 LAB — RENAL FUNCTION PANEL
Albumin: 4.1 g/dL (ref 3.5–5.2)
BUN: 17 mg/dL (ref 6–23)
CO2: 27 mEq/L (ref 19–32)
Calcium: 9 mg/dL (ref 8.4–10.5)
Chloride: 103 mEq/L (ref 96–112)
Creatinine, Ser: 1.02 mg/dL (ref 0.40–1.50)
GFR: 69.49 mL/min (ref >60.00)
Glucose, Bld: 103 mg/dL — ABNORMAL HIGH (ref 70–99)
Phosphorus: 4.3 mg/dL (ref 2.3–4.6)
Potassium: 4 mEq/L (ref 3.5–5.1)
Sodium: 137 mEq/L (ref 135–145)

## 2021-11-02 LAB — CBC
HCT: 36.6 % — ABNORMAL LOW (ref 39.0–52.0)
Hemoglobin: 12.3 g/dL — ABNORMAL LOW (ref 13.0–17.0)
MCHC: 33.7 g/dL (ref 30.0–36.0)
MCV: 87.7 fl (ref 78.0–100.0)
Platelets: 183 10*3/uL (ref 150.0–400.0)
RBC: 4.18 Mil/uL — ABNORMAL LOW (ref 4.22–5.81)
RDW: 13.7 % (ref 11.5–15.5)
WBC: 5.6 10*3/uL (ref 4.0–10.5)

## 2021-11-02 LAB — HEPATIC FUNCTION PANEL
ALT: 14 U/L (ref 0–53)
AST: 18 U/L (ref 0–37)
Albumin: 4.1 g/dL (ref 3.5–5.2)
Alkaline Phosphatase: 45 U/L (ref 39–117)
Bilirubin, Direct: 0.2 mg/dL (ref 0.0–0.3)
Total Bilirubin: 0.9 mg/dL (ref 0.2–1.2)
Total Protein: 6 g/dL (ref 6.0–8.3)

## 2021-11-02 MED ORDER — GABAPENTIN 300 MG PO CAPS: 300.0000 mg | ORAL_CAPSULE | Freq: Every day | ORAL | 11 refills | Status: DC

## 2021-11-02 NOTE — Assessment & Plan Note (Signed)
Has exhausted interventions by physiatry Takes the meloxicam at 4AM---when the pain is at its worst Discussed not taking ibuprofen still Uses tumeric/tylenol Tried CBD--no help Changed mattresses several times  Prefers staying away from narcotics Discussed trying gabapentin --he is willing to try

## 2021-11-05 ENCOUNTER — Other Ambulatory Visit: Payer: Self-pay | Admitting: Internal Medicine

## 2021-11-07 ENCOUNTER — Encounter: Payer: Self-pay | Admitting: Internal Medicine

## 2021-11-16 ENCOUNTER — Other Ambulatory Visit: Payer: Self-pay | Admitting: Internal Medicine

## 2021-11-17 NOTE — Telephone Encounter (Signed)
Last filled 09-23-21 #30 Last OV 11-02-21 Next OV 04-12-22 [facility]

## 2021-11-26 DIAGNOSIS — M5136 Other intervertebral disc degeneration, lumbar region: Secondary | ICD-10-CM | POA: Diagnosis not present

## 2021-11-26 DIAGNOSIS — M5416 Radiculopathy, lumbar region: Secondary | ICD-10-CM | POA: Diagnosis not present

## 2021-12-02 DIAGNOSIS — Z96651 Presence of right artificial knee joint: Secondary | ICD-10-CM | POA: Diagnosis not present

## 2021-12-02 DIAGNOSIS — M48062 Spinal stenosis, lumbar region with neurogenic claudication: Secondary | ICD-10-CM | POA: Diagnosis not present

## 2021-12-02 DIAGNOSIS — M25461 Effusion, right knee: Secondary | ICD-10-CM | POA: Diagnosis not present

## 2021-12-10 DIAGNOSIS — M79604 Pain in right leg: Secondary | ICD-10-CM | POA: Diagnosis not present

## 2021-12-10 DIAGNOSIS — M545 Low back pain, unspecified: Secondary | ICD-10-CM | POA: Diagnosis not present

## 2021-12-13 ENCOUNTER — Encounter: Payer: Self-pay | Admitting: Internal Medicine

## 2021-12-13 ENCOUNTER — Ambulatory Visit (INDEPENDENT_AMBULATORY_CARE_PROVIDER_SITE_OTHER): Payer: Medicare Other | Admitting: Internal Medicine

## 2021-12-13 DIAGNOSIS — M545 Low back pain, unspecified: Secondary | ICD-10-CM | POA: Diagnosis not present

## 2021-12-13 DIAGNOSIS — W57XXXA Bitten or stung by nonvenomous insect and other nonvenomous arthropods, initial encounter: Secondary | ICD-10-CM | POA: Diagnosis not present

## 2021-12-13 DIAGNOSIS — M71331 Other bursal cyst, right wrist: Secondary | ICD-10-CM

## 2021-12-13 DIAGNOSIS — L089 Local infection of the skin and subcutaneous tissue, unspecified: Secondary | ICD-10-CM

## 2021-12-13 DIAGNOSIS — S80861A Insect bite (nonvenomous), right lower leg, initial encounter: Secondary | ICD-10-CM | POA: Diagnosis not present

## 2021-12-13 DIAGNOSIS — M79604 Pain in right leg: Secondary | ICD-10-CM | POA: Diagnosis not present

## 2021-12-13 NOTE — Progress Notes (Signed)
Subjective:    Patient ID: [name], male    DOB: September 02, 1940, 81 y.o.   MRN: [id number]  HPI Here due to a tick bite  Didn't do well with the gabapentin Still "drunk" in AM after taking at night Didn't really help his back  Went back to [place] right leg radiculopathy Now referred to Dr Karen Chafe Back to just meloxicam/tylenol Did restart PT ---working on his poor posture, etc. This has helped a little  Tick noticed yesterday morning in the shower Wife took it off with tweezers after peroxide/alcohol/soap Right inguinal area Concerned about some persistent tick part Covering with silver ointment  Current Outpatient Medications on File Prior to Visit  Medication Sig Dispense Refill   acetaminophen (TYLENOL) 500 MG tablet Take 500 mg by mouth every 8 (eight) hours as needed (for pain).      alfuzosin (UROXATRAL) 10 MG 24 hr tablet Take 1 tablet (10 mg total) by mouth daily. 90 tablet 3   ALPRAZolam (XANAX) 0.5 MG tablet TAKE 1/2 TABLET BY MOUTH 2 TIMES DAILY AS NEEDED FOR ANXIETY. 30 tablet 0   aspirin 81 MG tablet Take 81 mg by mouth every other day.     Black Pepper-Turmeric (TURMERIC COMPLEX/BLACK PEPPER PO) Take by mouth.     doxazosin (CARDURA) 4 MG tablet Take 4 mg by mouth daily.     esomeprazole (NEXIUM) 40 MG capsule TAKE ONE CAPSULE BY MOUTH EVERY MORNING BEFORE BREAKFAST 90 capsule 3   finasteride (PROSCAR) 5 MG tablet Take 1 tablet (5 mg total) by mouth daily. 90 tablet 3   loratadine (CLARITIN) 10 MG tablet Take 10 mg by mouth daily.     meloxicam (MOBIC) 15 MG tablet Take 1 tablet (15 mg total) by mouth daily as needed for pain. 90 tablet 3   OVER THE COUNTER MEDICATION Deep Blue Polyphenol     polyethylene glycol powder (GLYCOLAX/MIRALAX) 17 GM/SCOOP powder 17 g once or twice daily for constipation. 500 g 0   pravastatin (PRAVACHOL) 40 MG tablet TAKE ONE TABLET BY MOUTH EVERY EVENING 90 tablet 1   No current facility-administered  medications on file prior to visit.    Allergies  Allergen Reactions   Anesthetics, Amide Nausea Only    Other reaction(s): Vomiting After surgery patient has problems with nausea and vomiting due to anesthetics   Other Nausea Only and Nausea And Vomiting    After surgery patient has problems with nausea and vomiting due to anesthetics   Atorvastatin     REACTION: myalgias   Oxycodone Nausea And Vomiting   Simvastatin     REACTION: joint and stomach pain   Latex Rash    Local area  (gloves - when worn)   Statins Other (See Comments)    Joint pain    Past Medical History:  Diagnosis Date   Allergy    Anxiety    Aortic stenosis    Barrett's esophagus    BPH (benign prostatic hypertrophy)    Diaphragm dysfunction    right frozen diaphragm   Diverticulitis    GERD (gastroesophageal reflux disease)    Hiatal hernia    Hx of adenomatous colonic polyps    Hyperlipidemia    Kidney stones    Obstructive sleep apnea    BiPAP   Osteoarthritis    Osteoporosis    PONV (postoperative nausea and vomiting)    Spinal stenosis     Past Surgical History:  Procedure Laterality Date   CATARACT EXTRACTION W/PHACO Right  04/20/2020   Procedure: CATARACT EXTRACTION PHACO AND INTRAOCULAR LENS PLACEMENT ([place]) RIGHT;  Surgeon: Eulogio Bear, MD;  Location: [facility];  Service: Ophthalmology;  Laterality: Right;  5.21 0:38.6   CATARACT EXTRACTION W/PHACO Left 05/11/2020   Procedure: CATARACT EXTRACTION PHACO AND INTRAOCULAR LENS PLACEMENT ([place]) LEFT;  Surgeon: Eulogio Bear, MD;  Location: [facility];  Service: Ophthalmology;  Laterality: Left;  4.15 0:30.7   ESOPHAGOGASTRODUODENOSCOPY     colon--6/04, Barrett's--7/05, Barrett's but no cancer--4/08   EYE MUSCLE SURGERY     L eye muscle surgery--2/01   JOINT REPLACEMENT  1/12   left TKR   KNEE ARTHROSCOPY     7/ 09 Arthroscopy left knee--Dr Noemi Chapel   LIPOMA EXCISION Left 2010   compressing nerve by spine---Dr  Terald Sleeper at [facility]     R rotator cuff repair--1/00  Noemi Chapel)   ROTATOR CUFF REPAIR Left ~2005 & 11/15   biceps repair also in 2015   TENDON REPAIR     Left knee tendon repair--1966   TOTAL KNEE ARTHROPLASTY Right 4/16   VASECTOMY     Vasectomy/spermatocele/?testicular cyst--1970    Family History  Problem Relation Age of Onset   Hypertension Mother    Breast cancer Mother    Colon polyps Mother    Coronary artery disease Mother    Diabetes Neg Hx    Colon cancer Neg Hx    Esophageal cancer Neg Hx    Gallbladder disease Neg Hx    Kidney disease Neg Hx     Social History   Socioeconomic History   Marital status: Married    Spouse name: Not on file   Number of children: 3   Years of education: Not on file   Highest education level: Not on file  Occupational History   Occupation: retired    Fish farm manager: RETIRED  Tobacco Use   Smoking status: Never    Passive exposure: Never   Smokeless tobacco: Never  Vaping Use   Vaping Use: Never used  Substance and Sexual Activity   Alcohol use: Not Currently    Alcohol/week: 0.0 standard drinks of alcohol    Comment: occassional wine   Drug use: No   Sexual activity: Not on file  Other Topics Concern   Not on file  Social History Narrative   Lives at home with wife      Has living will   Wife is health care POA---alternate daughter [name]   Would accept resuscitation attempts   No tube feeds if cognitively unaware         Social Determinants of Health   Financial Resource Strain: Not on file  Food Insecurity: Not on file  Transportation Needs: Not on file  Physical Activity: Not on file  Stress: Not on file  Social Connections: Not on file  Intimate Partner Violence: Not on file   Review of Systems Suddenly got lump at right wrist (radial side) Not really painful     Objective:   Physical Exam Constitutional:      Appearance: Normal appearance.  Musculoskeletal:     Comments: Movable mass  at radial (volar) right wrist. Not inflamed or tender  Skin:    Comments: Small (23m) indurated area with ~1cm surrounding redness in right medial upper thigh. Not infected  Neurological:     Mental Status: He is alert.            Assessment & Plan:

## 2021-12-13 NOTE — Assessment & Plan Note (Signed)
Reassured that it is not worrisome He will show it to his hand surgeon

## 2021-12-13 NOTE — Assessment & Plan Note (Signed)
Mild induration but no infection Discussed warm compresses No apparent retained tick parts

## 2021-12-14 DIAGNOSIS — M545 Low back pain, unspecified: Secondary | ICD-10-CM | POA: Diagnosis not present

## 2021-12-14 DIAGNOSIS — M79604 Pain in right leg: Secondary | ICD-10-CM | POA: Diagnosis not present

## 2021-12-16 DIAGNOSIS — M545 Low back pain, unspecified: Secondary | ICD-10-CM | POA: Diagnosis not present

## 2021-12-16 DIAGNOSIS — M79604 Pain in right leg: Secondary | ICD-10-CM | POA: Diagnosis not present

## 2021-12-21 ENCOUNTER — Ambulatory Visit: Payer: Self-pay | Admitting: Neurosurgery

## 2021-12-27 NOTE — Progress Notes (Unsigned)
Referring Physician:  [facility] [address], [address],  [place] [address]  Primary Physician:  Venia Carbon, MD  History of Present Illness: 12/28/2021 [name] is here today with a chief complaint of pain in his right posterior leg starting in his buttock and extending down the back of his thigh to his knee.  He also sometimes has discomfort in the back of his calf, but that is somewhat better since he started using a pain cream.  He has been having pain for 15 to 20 years but worse over the past 2 years.  He had left leg pain until about 6 months ago when that improved and then he began having right leg pain more so this year.  He takes 2 Aleve in the morning which is helped significantly but walking still continues to be a problem for him with pain as bad as 9 out of 10.   Bowel/Bladder Dysfunction: none  Conservative measures: chiropractor Physical therapy: currently participating at [facility] for the past 2 weeks (has completed 3 sessions) Multimodal medical therapy including regular antiinflammatories: tylenol, meloxicam, aleve Injections:  has received epidural steroid injections 07/27/2021: Bilateral S1 transforaminal ESI (50% relief for 2 weeks) 06/03/2021: RFA to the right L4-5 and L5-S1 facet joints (40% relief)  05/26/2021: MBB to the right L4-5 and L5-S1 facet joints (8/10 to 0/10) 05/12/2021: MBB to the right L4-5 and L5-S1 facet joints (8/10 to 1-2/10) 04/14/2021: Left S1 transforaminal ESI  12/09/2020: RFA to the left L4-5 and L5-S1 facet joints (70% improvement) 11/03/2020: MBB to the left L4-5 and L5-S1 facet joints (8/10 to 2/10) 10/19/2020: MBB to the left L4-5 and L5-S1 facet joints (10/10 to 2/10) 10/09/2019: Left piriformis trigger point injection (6 week relief, patient could not breathe well after injection)   Past Surgery: Excision of Lipoma from his lumbar region in 2010  [name] has no symptoms of cervical  myelopathy.  The symptoms are causing a significant impact on the patient's life.   Review of Systems:  A 10 point review of systems is negative, except for the pertinent positives and negatives detailed in the HPI.  Past Medical History: Past Medical History:  Diagnosis Date   Allergy    Anxiety    Aortic stenosis    Barrett's esophagus    BPH (benign prostatic hypertrophy)    Diaphragm dysfunction    right frozen diaphragm   Diverticulitis    GERD (gastroesophageal reflux disease)    Hiatal hernia    Hx of adenomatous colonic polyps    Hyperlipidemia    Kidney stones    Obstructive sleep apnea    BiPAP   Osteoarthritis    Osteoporosis    PONV (postoperative nausea and vomiting)    Spinal stenosis     Past Surgical History: Past Surgical History:  Procedure Laterality Date   CATARACT EXTRACTION W/PHACO Right 04/20/2020   Procedure: CATARACT EXTRACTION PHACO AND INTRAOCULAR LENS PLACEMENT ([place]) RIGHT;  Surgeon: Eulogio Bear, MD;  Location: [facility];  Service: Ophthalmology;  Laterality: Right;  5.21 0:38.6   CATARACT EXTRACTION W/PHACO Left 05/11/2020   Procedure: CATARACT EXTRACTION PHACO AND INTRAOCULAR LENS PLACEMENT ([place]) LEFT;  Surgeon: Eulogio Bear, MD;  Location: [facility];  Service: Ophthalmology;  Laterality: Left;  4.15 0:30.7   ESOPHAGOGASTRODUODENOSCOPY     colon--6/04, Barrett's--7/05, Barrett's but no cancer--4/08   EYE MUSCLE SURGERY     L eye muscle surgery--2/01   JOINT  REPLACEMENT  1/12   left TKR   KNEE ARTHROSCOPY     7/ 09 Arthroscopy left knee--Dr Noemi Chapel   LIPOMA EXCISION Left 2010   compressing nerve by spine---Dr Terald Sleeper at [place]     R rotator cuff repair--1/00  Noemi Chapel)   ROTATOR CUFF REPAIR Left ~2005 & 11/15   biceps repair also in 2015   TENDON REPAIR     Left knee tendon repair--1966   TOTAL KNEE ARTHROPLASTY Right 4/16   VASECTOMY     Vasectomy/spermatocele/?testicular  cyst--1970    Allergies: Allergies as of 12/28/2021 - Review Complete 12/13/2021  Allergen Reaction Noted   Anesthetics, amide Nausea Only 12/18/2013   Other Nausea Only and Nausea And Vomiting 12/18/2013   Atorvastatin  04/21/2006   Oxycodone Nausea And Vomiting 10/28/2014   Simvastatin  04/21/2006   Latex Rash 04/07/2014   Statins Other (See Comments) 11/17/2014    Medications: No outpatient medications have been marked as taking for the 12/28/21 encounter (Office Visit) with Meade Maw, MD.    Social History: Social History   Tobacco Use   Smoking status: Never    Passive exposure: Never   Smokeless tobacco: Never  Vaping Use   Vaping Use: Never used  Substance Use Topics   Alcohol use: Not Currently    Alcohol/week: 0.0 standard drinks of alcohol    Comment: occassional wine   Drug use: No    Family Medical History: Family History  Problem Relation Age of Onset   Hypertension Mother    Breast cancer Mother    Colon polyps Mother    Coronary artery disease Mother    Diabetes Neg Hx    Colon cancer Neg Hx    Esophageal cancer Neg Hx    Gallbladder disease Neg Hx    Kidney disease Neg Hx     Physical Examination: There were no vitals filed for this visit.  General: Patient is well developed, well nourished, calm, collected, and in no apparent distress. Attention to examination is appropriate.  Neck:   Supple.  Full range of motion.  Respiratory: Patient is breathing without any difficulty.   NEUROLOGICAL:     Awake, alert, oriented to person, place, and time.  Speech is clear and fluent. Fund of knowledge is appropriate.   Cranial Nerves: Pupils equal round and reactive to light.  Facial tone is symmetric.  Facial sensation is symmetric. Shoulder shrug is symmetric. Tongue protrusion is midline.  There is no pronator drift.  ROM of spine: full.    Strength: Side Biceps Triceps Deltoid Interossei Grip Wrist Ext. Wrist Flex.  R '5 5 5 5 5 5 5   '$ L '5 5 5 5 5 5 5   '$ Side Iliopsoas Quads Hamstring PF DF EHL  R '5 5 5 5 5 5  '$ L '5 5 5 5 5 5   '$ Reflexes are 1+ and symmetric at the biceps, triceps, brachioradialis, patella and achilles.   Hoffman's is absent.  Clonus is not present.  Toes are down-going.  Bilateral upper and lower extremity sensation is intact to light touch.    No evidence of dysmetria noted.  Gait is slowed  Medical Decision Making  Imaging: MRI L spine 10/07/20 L1-2: Loss of disc height, disc bulge with associated osteophytic component and moderate facet degenerative changes resulting in mild right neural foraminal narrowing. No significant spinal canal stenosis.   L2-3: Loss of disc height, right asymmetric disc bulge with associated osteophytic component and  moderate facet degenerative changes resulting in narrowing of the right subarticular zone, mild spinal canal and mild right neural foraminal narrowing.   L3-4: Loss of disc height, disc bulge with associated osteophytic component, advanced facet degenerative changes and ligamentum flavum redundancy resulting in moderate spinal canal stenosis and mild bilateral neural foraminal narrowing, right greater than left. The spinal canal stenosis has progressed from prior MRI.   L4-5: Postsurgical changes from left laminectomy. Loss of disc height, left asymmetric disc bulge with associated osteophytic component and advanced facet degenerative changes. Findings result in mild spinal canal stenosis with narrowing of the bilateral subarticular zones, left greater than right, mild right and severe left neural foraminal. Findings have progressed from prior MRI.   L5-S1: Loss disc height, disc bulge and advanced facet degenerative changes, left greater than right resulting in moderate right and severe left neural foraminal narrowing. No significant spinal canal stenosis.   IMPRESSION: 1. Degenerative changes of the lumbar spine, progressed from prior MRI. 2.  Moderate spinal canal stenosis at L3-4 and mild at T12-L1, L2-3 and L4-5. 3. Multilevel neural foraminal narrowing, severe bilaterally at T12-L1 and on the left at L4-5 and L5-S1.     Electronically Signed   By: Pedro Earls M.D.   On: 10/07/2020 15:52    I have personally reviewed the images and agree with the above interpretation.  Assessment and Plan: Mr. Delsanto is a pleasant 81 y.o. 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  His imaging is outdated.  I would like to repeat his MRI scan.  Is possible he has a right L5 or S1 radiculopathy.  He is currently getting significant relief with Aleve.  I did discuss with him that this may be the most appropriate choice of treatment for him, but we will reevaluate after he gets his imaging performed.   I spent a total of 30 minutes in face-to-face and non-face-to-face activities related to this patient's care today.  Thank you for involving me in the care of this patient.      Paulette Rockford K. Izora Ribas MD, [facility]

## 2021-12-28 ENCOUNTER — Encounter: Payer: Self-pay | Admitting: Neurosurgery

## 2021-12-28 ENCOUNTER — Ambulatory Visit (INDEPENDENT_AMBULATORY_CARE_PROVIDER_SITE_OTHER): Payer: Medicare Other | Admitting: Neurosurgery

## 2021-12-28 VITALS — BP 126/60 | HR 74 | Ht 67.5 in | Wt 213.2 lb

## 2021-12-28 DIAGNOSIS — M5416 Radiculopathy, lumbar region: Secondary | ICD-10-CM

## 2021-12-28 DIAGNOSIS — M545 Low back pain, unspecified: Secondary | ICD-10-CM | POA: Diagnosis not present

## 2021-12-28 DIAGNOSIS — M48062 Spinal stenosis, lumbar region with neurogenic claudication: Secondary | ICD-10-CM | POA: Diagnosis not present

## 2021-12-28 DIAGNOSIS — M79604 Pain in right leg: Secondary | ICD-10-CM | POA: Diagnosis not present

## 2021-12-28 NOTE — Addendum Note (Signed)
Addended by: Meade Maw on: 12/28/2021 10:45 AM   Modules accepted: Orders

## 2021-12-29 DIAGNOSIS — M67431 Ganglion, right wrist: Secondary | ICD-10-CM | POA: Diagnosis not present

## 2021-12-31 DIAGNOSIS — D1801 Hemangioma of skin and subcutaneous tissue: Secondary | ICD-10-CM | POA: Diagnosis not present

## 2021-12-31 DIAGNOSIS — L814 Other melanin hyperpigmentation: Secondary | ICD-10-CM | POA: Diagnosis not present

## 2021-12-31 DIAGNOSIS — D225 Melanocytic nevi of trunk: Secondary | ICD-10-CM | POA: Diagnosis not present

## 2021-12-31 DIAGNOSIS — L821 Other seborrheic keratosis: Secondary | ICD-10-CM | POA: Diagnosis not present

## 2022-01-04 DIAGNOSIS — M545 Low back pain, unspecified: Secondary | ICD-10-CM | POA: Diagnosis not present

## 2022-01-04 DIAGNOSIS — M79604 Pain in right leg: Secondary | ICD-10-CM | POA: Diagnosis not present

## 2022-01-06 DIAGNOSIS — M545 Low back pain, unspecified: Secondary | ICD-10-CM | POA: Diagnosis not present

## 2022-01-06 DIAGNOSIS — M79604 Pain in right leg: Secondary | ICD-10-CM | POA: Diagnosis not present

## 2022-01-11 ENCOUNTER — Encounter: Payer: Self-pay | Admitting: Internal Medicine

## 2022-01-11 ENCOUNTER — Ambulatory Visit (INDEPENDENT_AMBULATORY_CARE_PROVIDER_SITE_OTHER): Payer: Medicare Other | Admitting: Internal Medicine

## 2022-01-11 DIAGNOSIS — M79604 Pain in right leg: Secondary | ICD-10-CM | POA: Diagnosis not present

## 2022-01-11 DIAGNOSIS — M545 Low back pain, unspecified: Secondary | ICD-10-CM | POA: Diagnosis not present

## 2022-01-11 DIAGNOSIS — J0101 Acute recurrent maxillary sinusitis: Secondary | ICD-10-CM | POA: Insufficient documentation

## 2022-01-11 MED ORDER — ALPRAZOLAM 0.5 MG PO TABS: ORAL_TABLET | ORAL | 0 refills | Status: DC

## 2022-01-11 MED ORDER — AMOXICILLIN-POT CLAVULANATE 875-125 MG PO TABS: 1.0000 | ORAL_TABLET | Freq: Two times a day (BID) | ORAL | 0 refills | Status: DC

## 2022-01-11 NOTE — Progress Notes (Signed)
Subjective:    Patient ID: [name], male    DOB: [birth year], 81 y.o.   MRN: [id number]  HPI Here due to persistent respiratory symptoms  Having right maxillary sinus pain Known problem there--sees Tami Ribas Goes back years---now having symptoms again for 2 weeks or so Mostly painful when sleeping with BiPap---clogged up OTC sinus meds --not helping now Has used nasal lavage in past  No fever No cough Some PND---mostly at night No ear pain  Current Outpatient Medications on File Prior to Visit  Medication Sig Dispense Refill   acetaminophen (TYLENOL) 500 MG tablet Take 500 mg by mouth every 8 (eight) hours as needed (for pain).      alfuzosin (UROXATRAL) 10 MG 24 hr tablet Take 1 tablet (10 mg total) by mouth daily. 90 tablet 3   ALPRAZolam (XANAX) 0.5 MG tablet TAKE 1/2 TABLET BY MOUTH 2 TIMES DAILY AS NEEDED FOR ANXIETY. 30 tablet 0   aspirin 81 MG tablet Take 81 mg by mouth every other day.     Black Pepper-Turmeric (TURMERIC COMPLEX/BLACK PEPPER PO) Take by mouth.     doxazosin (CARDURA) 4 MG tablet Take 4 mg by mouth daily.     esomeprazole (NEXIUM) 40 MG capsule TAKE ONE CAPSULE BY MOUTH EVERY MORNING BEFORE BREAKFAST 90 capsule 3   finasteride (PROSCAR) 5 MG tablet Take 1 tablet (5 mg total) by mouth daily. 90 tablet 3   loratadine (CLARITIN) 10 MG tablet Take 10 mg by mouth daily.     meloxicam (MOBIC) 15 MG tablet Take 1 tablet (15 mg total) by mouth daily as needed for pain. 90 tablet 3   OVER THE COUNTER MEDICATION Deep Blue Polyphenol     polyethylene glycol powder (GLYCOLAX/MIRALAX) 17 GM/SCOOP powder 17 g once or twice daily for constipation. 500 g 0   pravastatin (PRAVACHOL) 40 MG tablet TAKE ONE TABLET BY MOUTH EVERY EVENING 90 tablet 1   No current facility-administered medications on file prior to visit.    Allergies  Allergen Reactions   Anesthetics, Amide Nausea Only    Other reaction(s): Vomiting After surgery patient has problems with nausea and  vomiting due to anesthetics   Other Nausea Only and Nausea And Vomiting    After surgery patient has problems with nausea and vomiting due to anesthetics   Atorvastatin     REACTION: myalgias   Oxycodone Nausea And Vomiting   Simvastatin     REACTION: joint and stomach pain   Latex Rash    Local area  (gloves - when worn) Local area Local area  (gloves - when worn)   Statins Other (See Comments)    Joint pain    Past Medical History:  Diagnosis Date   Allergy    Anxiety    Aortic stenosis    Barrett's esophagus    BPH (benign prostatic hypertrophy)    Diaphragm dysfunction    right frozen diaphragm   Diverticulitis    GERD (gastroesophageal reflux disease)    Hiatal hernia    Hx of adenomatous colonic polyps    Hyperlipidemia    Kidney stones    Obstructive sleep apnea    BiPAP   Osteoarthritis    Osteoporosis    PONV (postoperative nausea and vomiting)    Spinal stenosis     Past Surgical History:  Procedure Laterality Date   CATARACT EXTRACTION W/PHACO Right 04/20/2020   Procedure: CATARACT EXTRACTION PHACO AND INTRAOCULAR LENS PLACEMENT ([facility]) RIGHT;  Surgeon: Eulogio Bear, MD;  Location: [place];  Service: Ophthalmology;  Laterality: Right;  5.21 0:38.6   CATARACT EXTRACTION W/PHACO Left 05/11/2020   Procedure: CATARACT EXTRACTION PHACO AND INTRAOCULAR LENS PLACEMENT ([place]) LEFT;  Surgeon: Eulogio Bear, MD;  Location: [facility];  Service: Ophthalmology;  Laterality: Left;  4.15 0:30.7   ESOPHAGOGASTRODUODENOSCOPY     colon--6/04, Barrett's--7/05, Barrett's but no cancer--4/08   EYE MUSCLE SURGERY     L eye muscle surgery--2/01   JOINT REPLACEMENT  1/12   left TKR   KNEE ARTHROSCOPY     7/ 09 Arthroscopy left knee--Dr Noemi Chapel   LIPOMA EXCISION Left 2010   compressing nerve by spine---Dr Terald Sleeper at [facility]     R rotator cuff repair--1/00  Noemi Chapel)   ROTATOR CUFF REPAIR Left ~2005 & 11/15   biceps repair also  in 2015   TENDON REPAIR     Left knee tendon repair--1966   TOTAL KNEE ARTHROPLASTY Right 4/16   VASECTOMY     Vasectomy/spermatocele/?testicular cyst--1970    Family History  Problem Relation Age of Onset   Hypertension Mother    Breast cancer Mother    Colon polyps Mother    Coronary artery disease Mother    Diabetes Neg Hx    Colon cancer Neg Hx    Esophageal cancer Neg Hx    Gallbladder disease Neg Hx    Kidney disease Neg Hx     Social History   Socioeconomic History   Marital status: Married    Spouse name: Not on file   Number of children: 3   Years of education: Not on file   Highest education level: Not on file  Occupational History   Occupation: retired    Fish farm manager: RETIRED  Tobacco Use   Smoking status: Never    Passive exposure: Never   Smokeless tobacco: Never  Vaping Use   Vaping Use: Never used  Substance and Sexual Activity   Alcohol use: Not Currently    Alcohol/week: 0.0 standard drinks of alcohol    Comment: occassional wine   Drug use: No   Sexual activity: Not on file  Other Topics Concern   Not on file  Social History Narrative   Lives at home with wife      Has living will   Wife is health care POA---alternate daughter [name]   Would accept resuscitation attempts   No tube feeds if cognitively unaware         Social Determinants of Health   Financial Resource Strain: Not on file  Food Insecurity: Not on file  Transportation Needs: Not on file  Physical Activity: Not on file  Stress: Not on file  Social Connections: Not on file  Intimate Partner Violence: Not on file   Review of Systems Doesn't feel sick No N/V Trying to go to gym Is getting back MRI via Dr Cari Caraway     Objective:   Physical Exam Constitutional:      Appearance: Normal appearance.  HENT:     Head:     Comments: Mild right maxillary tenderness    Right Ear: Tympanic membrane and ear canal normal.     Left Ear: Tympanic membrane and ear canal  normal.     Nose:     Comments: Mild congestion    Mouth/Throat:     Pharynx: No oropharyngeal exudate or posterior oropharyngeal erythema.  Pulmonary:     Effort: Pulmonary effort is normal.     Breath sounds:  Normal breath sounds. No wheezing or rales.  Musculoskeletal:     Cervical back: Neck supple.  Lymphadenopathy:     Cervical: No cervical adenopathy.  Neurological:     Mental Status: He is alert.            Assessment & Plan:

## 2022-01-11 NOTE — Assessment & Plan Note (Signed)
Symptoms going on for 2 weeks Will try augmentin 875 bid x 7 days Should try the lavage

## 2022-01-19 ENCOUNTER — Ambulatory Visit
Admission: RE | Admit: 2022-01-19 | Discharge: 2022-01-19 | Disposition: A | Discharge status: Home / Self Care | Payer: Medicare Other | Source: Ambulatory Visit | Attending: Neurosurgery | Admitting: Neurosurgery

## 2022-01-19 DIAGNOSIS — M5416 Radiculopathy, lumbar region: Secondary | ICD-10-CM

## 2022-01-19 DIAGNOSIS — S76312A Strain of muscle, fascia and tendon of the posterior muscle group at thigh level, left thigh, initial encounter: Secondary | ICD-10-CM | POA: Diagnosis not present

## 2022-01-19 DIAGNOSIS — M4186 Other forms of scoliosis, lumbar region: Secondary | ICD-10-CM | POA: Diagnosis not present

## 2022-01-19 DIAGNOSIS — R102 Pelvic and perineal pain: Secondary | ICD-10-CM | POA: Diagnosis not present

## 2022-01-19 DIAGNOSIS — M48061 Spinal stenosis, lumbar region without neurogenic claudication: Secondary | ICD-10-CM | POA: Diagnosis not present

## 2022-01-19 DIAGNOSIS — M48062 Spinal stenosis, lumbar region with neurogenic claudication: Secondary | ICD-10-CM

## 2022-01-21 ENCOUNTER — Telehealth: Payer: Self-pay

## 2022-01-21 DIAGNOSIS — M545 Low back pain, unspecified: Secondary | ICD-10-CM | POA: Diagnosis not present

## 2022-01-21 DIAGNOSIS — M79604 Pain in right leg: Secondary | ICD-10-CM | POA: Diagnosis not present

## 2022-01-21 NOTE — Telephone Encounter (Signed)
Left message to return call 

## 2022-01-21 NOTE — Telephone Encounter (Signed)
-----   Message from Peggyann Shoals sent at 01/21/2022  7:59 AM EDT ----- Regarding: MRI results Contact: [phone] He left a message yesterday on the v/m. He had his MRI and would like to review results with Dr.Yarbrough to decide what he can do for his pain.

## 2022-01-24 NOTE — Telephone Encounter (Signed)
I spoke with the patient and notified him of this, he verbalized understanding and will call his ortho doctor at [facility]. I have also faxed his [facility] MRI report.

## 2022-01-27 DIAGNOSIS — Z96653 Presence of artificial knee joint, bilateral: Secondary | ICD-10-CM | POA: Diagnosis not present

## 2022-02-09 DIAGNOSIS — R0981 Nasal congestion: Secondary | ICD-10-CM | POA: Diagnosis not present

## 2022-02-09 DIAGNOSIS — H6123 Impacted cerumen, bilateral: Secondary | ICD-10-CM | POA: Diagnosis not present

## 2022-02-09 DIAGNOSIS — J392 Other diseases of pharynx: Secondary | ICD-10-CM | POA: Diagnosis not present

## 2022-02-09 DIAGNOSIS — H903 Sensorineural hearing loss, bilateral: Secondary | ICD-10-CM | POA: Diagnosis not present

## 2022-02-17 DIAGNOSIS — G8929 Other chronic pain: Secondary | ICD-10-CM | POA: Diagnosis not present

## 2022-02-17 DIAGNOSIS — M25512 Pain in left shoulder: Secondary | ICD-10-CM | POA: Diagnosis not present

## 2022-02-17 DIAGNOSIS — M19012 Primary osteoarthritis, left shoulder: Secondary | ICD-10-CM | POA: Diagnosis not present

## 2022-02-22 DIAGNOSIS — M25551 Pain in right hip: Secondary | ICD-10-CM | POA: Diagnosis not present

## 2022-02-22 DIAGNOSIS — M7071 Other bursitis of hip, right hip: Secondary | ICD-10-CM | POA: Diagnosis not present

## 2022-02-22 DIAGNOSIS — S76311A Strain of muscle, fascia and tendon of the posterior muscle group at thigh level, right thigh, initial encounter: Secondary | ICD-10-CM | POA: Diagnosis not present

## 2022-02-22 DIAGNOSIS — W1839XA Other fall on same level, initial encounter: Secondary | ICD-10-CM | POA: Diagnosis not present

## 2022-02-22 DIAGNOSIS — G8929 Other chronic pain: Secondary | ICD-10-CM | POA: Diagnosis not present

## 2022-02-23 DIAGNOSIS — M25551 Pain in right hip: Secondary | ICD-10-CM | POA: Diagnosis not present

## 2022-02-23 DIAGNOSIS — M25561 Pain in right knee: Secondary | ICD-10-CM | POA: Diagnosis not present

## 2022-02-23 DIAGNOSIS — M6281 Muscle weakness (generalized): Secondary | ICD-10-CM | POA: Diagnosis not present

## 2022-03-04 DIAGNOSIS — M25561 Pain in right knee: Secondary | ICD-10-CM | POA: Diagnosis not present

## 2022-03-04 DIAGNOSIS — M25551 Pain in right hip: Secondary | ICD-10-CM | POA: Diagnosis not present

## 2022-03-04 DIAGNOSIS — M6281 Muscle weakness (generalized): Secondary | ICD-10-CM | POA: Diagnosis not present

## 2022-03-07 DIAGNOSIS — M25551 Pain in right hip: Secondary | ICD-10-CM | POA: Diagnosis not present

## 2022-03-07 DIAGNOSIS — M25561 Pain in right knee: Secondary | ICD-10-CM | POA: Diagnosis not present

## 2022-03-07 DIAGNOSIS — M6281 Muscle weakness (generalized): Secondary | ICD-10-CM | POA: Diagnosis not present

## 2022-03-10 DIAGNOSIS — M6281 Muscle weakness (generalized): Secondary | ICD-10-CM | POA: Diagnosis not present

## 2022-03-10 DIAGNOSIS — M25551 Pain in right hip: Secondary | ICD-10-CM | POA: Diagnosis not present

## 2022-03-10 DIAGNOSIS — M25561 Pain in right knee: Secondary | ICD-10-CM | POA: Diagnosis not present

## 2022-03-24 DIAGNOSIS — M25561 Pain in right knee: Secondary | ICD-10-CM | POA: Diagnosis not present

## 2022-03-24 DIAGNOSIS — M25551 Pain in right hip: Secondary | ICD-10-CM | POA: Diagnosis not present

## 2022-03-24 DIAGNOSIS — M6281 Muscle weakness (generalized): Secondary | ICD-10-CM | POA: Diagnosis not present

## 2022-03-24 DIAGNOSIS — M79604 Pain in right leg: Secondary | ICD-10-CM | POA: Diagnosis not present

## 2022-03-29 DIAGNOSIS — M79604 Pain in right leg: Secondary | ICD-10-CM | POA: Diagnosis not present

## 2022-03-29 DIAGNOSIS — M25561 Pain in right knee: Secondary | ICD-10-CM | POA: Diagnosis not present

## 2022-03-29 DIAGNOSIS — M6281 Muscle weakness (generalized): Secondary | ICD-10-CM | POA: Diagnosis not present

## 2022-03-29 DIAGNOSIS — M25551 Pain in right hip: Secondary | ICD-10-CM | POA: Diagnosis not present

## 2022-04-05 DIAGNOSIS — M79604 Pain in right leg: Secondary | ICD-10-CM | POA: Diagnosis not present

## 2022-04-05 DIAGNOSIS — M25561 Pain in right knee: Secondary | ICD-10-CM | POA: Diagnosis not present

## 2022-04-05 DIAGNOSIS — M25551 Pain in right hip: Secondary | ICD-10-CM | POA: Diagnosis not present

## 2022-04-05 DIAGNOSIS — M6281 Muscle weakness (generalized): Secondary | ICD-10-CM | POA: Diagnosis not present

## 2022-04-12 ENCOUNTER — Ambulatory Visit (INDEPENDENT_AMBULATORY_CARE_PROVIDER_SITE_OTHER): Payer: Medicare Other | Admitting: Internal Medicine

## 2022-04-12 ENCOUNTER — Encounter: Payer: Self-pay | Admitting: Internal Medicine

## 2022-04-12 VITALS — BP 108/70 | HR 64 | Temp 97.6°F | Ht 66.0 in | Wt 211.0 lb

## 2022-04-12 DIAGNOSIS — I10 Essential (primary) hypertension: Secondary | ICD-10-CM | POA: Diagnosis not present

## 2022-04-12 DIAGNOSIS — M48062 Spinal stenosis, lumbar region with neurogenic claudication: Secondary | ICD-10-CM

## 2022-04-12 DIAGNOSIS — F39 Unspecified mood [affective] disorder: Secondary | ICD-10-CM | POA: Diagnosis not present

## 2022-04-12 DIAGNOSIS — N138 Other obstructive and reflux uropathy: Secondary | ICD-10-CM | POA: Diagnosis not present

## 2022-04-12 DIAGNOSIS — N401 Enlarged prostate with lower urinary tract symptoms: Secondary | ICD-10-CM | POA: Diagnosis not present

## 2022-04-12 DIAGNOSIS — Z0000 Encounter for general adult medical examination without abnormal findings: Secondary | ICD-10-CM | POA: Diagnosis not present

## 2022-04-12 DIAGNOSIS — K2100 Gastro-esophageal reflux disease with esophagitis, without bleeding: Secondary | ICD-10-CM

## 2022-04-12 MED ORDER — ESOMEPRAZOLE MAGNESIUM 40 MG PO CPDR
DELAYED_RELEASE_CAPSULE | ORAL | 3 refills | Status: DC
Start: 2022-04-12 — End: 2023-05-04

## 2022-04-12 NOTE — Progress Notes (Signed)
Subjective:    Patient ID: [name], male    DOB: [birth year], 81 y.o.   MRN: [id number]  HPI Here for Medicare wellness visit and follow up of chronic health conditions Reviewed advanced directives Reviewed other doctors----Dr Yarborough---neurosurgery, Dr Stafford/Ceraulo-sports medicine, Dr Joycelyn Rua, Dr Myrtice Lauth, Dr Hochrein--cardiology, Dr Adella Nissen, Dr Barbee Cough, Dr Waymond Cera, Dr Jonna Clark, [facility] dermatology, Dr Lightfoot--thoracic surgeon, Dr Valla Leaver No hospitalizations or surgery Rare glass of wine No tobacco Vision is fine Hearing is okay One fall---no injury of note Mild mood problems Is independent with instrumental ADLs No memory issues  Has seen many doctors about his spinal stenosis Saw neurosurgery--but needed work on hamstring first Pain in back is better Is doing exercises at gym 3 days a week Also getting PT for hamstring and back Getting around better now Overall has aching and stiffness   Evaluated for elevated diaphragm No action for this is planned Stable DOE--very limited on stairs, etc No cough  No chest pain No syncope. Only has dizziness if he pushes himself No edema No palpitations  Voids okay Empties okay Nocturia x 2  Takes the nexium daily Controls his heartburn No dysphagia  Has some stress Wife with kidney problems and memory decline This has been frustrating----but no regular depression Not anhedonic  Current Outpatient Medications on File Prior to Visit  Medication Sig Dispense Refill   acetaminophen (TYLENOL) 650 MG CR tablet Take 650 mg by mouth every 8 (eight) hours as needed for pain. Takes 2 in am and 1 in pm     alfuzosin (UROXATRAL) 10 MG 24 hr tablet Take 1 tablet (10 mg total) by mouth daily. 90 tablet 3   ALPRAZolam (XANAX) 0.5 MG tablet TAKE 1/2 TABLET BY MOUTH 2 TIMES DAILY AS NEEDED FOR ANXIETY. 30 tablet 0   aspirin 81 MG tablet Take 81 mg by mouth every other  day.     doxazosin (CARDURA) 4 MG tablet Take 4 mg by mouth daily.     esomeprazole (NEXIUM) 40 MG capsule TAKE ONE CAPSULE BY MOUTH EVERY MORNING BEFORE BREAKFAST 90 capsule 3   finasteride (PROSCAR) 5 MG tablet Take 1 tablet (5 mg total) by mouth daily. 90 tablet 3   loratadine (CLARITIN) 10 MG tablet Take 10 mg by mouth daily.     OVER THE COUNTER MEDICATION Deep Blue Polyphenol     polyethylene glycol powder (GLYCOLAX/MIRALAX) 17 GM/SCOOP powder 17 g once or twice daily for constipation. 500 g 0   pravastatin (PRAVACHOL) 40 MG tablet TAKE ONE TABLET BY MOUTH EVERY EVENING 90 tablet 1   No current facility-administered medications on file prior to visit.    Allergies  Allergen Reactions   Anesthetics, Amide Nausea Only    Other reaction(s): Vomiting After surgery patient has problems with nausea and vomiting due to anesthetics   Other Nausea Only and Nausea And Vomiting    After surgery patient has problems with nausea and vomiting due to anesthetics   Atorvastatin     REACTION: myalgias   Oxycodone Nausea And Vomiting   Simvastatin     REACTION: joint and stomach pain   Latex Rash    Local area  (gloves - when worn) Local area Local area  (gloves - when worn)   Statins Other (See Comments)    Joint pain    Past Medical History:  Diagnosis Date   Allergy    Anxiety    Aortic stenosis    Barrett's esophagus    BPH (benign prostatic  hypertrophy)    Diaphragm dysfunction    right frozen diaphragm   Diverticulitis    GERD (gastroesophageal reflux disease)    Hiatal hernia    Hx of adenomatous colonic polyps    Hyperlipidemia    Kidney stones    Obstructive sleep apnea    BiPAP   Osteoarthritis    Osteoporosis    PONV (postoperative nausea and vomiting)    Spinal stenosis     Past Surgical History:  Procedure Laterality Date   CATARACT EXTRACTION W/PHACO Right 04/20/2020   Procedure: CATARACT EXTRACTION PHACO AND INTRAOCULAR LENS PLACEMENT ([place]) RIGHT;   Surgeon: Eulogio Bear, MD;  Location: [facility];  Service: Ophthalmology;  Laterality: Right;  5.21 0:38.6   CATARACT EXTRACTION W/PHACO Left 05/11/2020   Procedure: CATARACT EXTRACTION PHACO AND INTRAOCULAR LENS PLACEMENT ([place]) LEFT;  Surgeon: Eulogio Bear, MD;  Location: [place];  Service: Ophthalmology;  Laterality: Left;  4.15 0:30.7   ESOPHAGOGASTRODUODENOSCOPY     colon--6/04, Barrett's--7/05, Barrett's but no cancer--4/08   EYE MUSCLE SURGERY     L eye muscle surgery--2/01   JOINT REPLACEMENT  1/12   left TKR   KNEE ARTHROSCOPY     7/ 09 Arthroscopy left knee--Dr Noemi Chapel   LIPOMA EXCISION Left 2010   compressing nerve by spine---Dr Terald Sleeper at [facility]     R rotator cuff repair--1/00  Noemi Chapel)   ROTATOR CUFF REPAIR Left ~2005 & 11/15   biceps repair also in 2015   TENDON REPAIR     Left knee tendon repair--1966   TOTAL KNEE ARTHROPLASTY Right 4/16   VASECTOMY     Vasectomy/spermatocele/?testicular cyst--1970    Family History  Problem Relation Age of Onset   Hypertension Mother    Breast cancer Mother    Colon polyps Mother    Coronary artery disease Mother    Diabetes Neg Hx    Colon cancer Neg Hx    Esophageal cancer Neg Hx    Gallbladder disease Neg Hx    Kidney disease Neg Hx     Social History   Socioeconomic History   Marital status: Married    Spouse name: Not on file   Number of children: 3   Years of education: Not on file   Highest education level: Not on file  Occupational History   Occupation: retired    Fish farm manager: RETIRED  Tobacco Use   Smoking status: Never    Passive exposure: Never   Smokeless tobacco: Never  Vaping Use   Vaping Use: Never used  Substance and Sexual Activity   Alcohol use: Not Currently    Alcohol/week: 0.0 standard drinks of alcohol    Comment: occassional wine   Drug use: No   Sexual activity: Not on file  Other Topics Concern   Not on file  Social History Narrative    Lives at home with wife      Has living will   Wife is health care POA---alternate daughter [name]   Would accept resuscitation attempts   No tube feeds if cognitively unaware         Social Determinants of Health   Financial Resource Strain: Not on file  Food Insecurity: Not on file  Transportation Needs: Not on file  Physical Activity: Not on file  Stress: Not on file  Social Connections: Not on file  Intimate Partner Violence: Not on file   Review of Systems Appetite is fine Weight is fairly stable--has to  be careful with his sweets (which he loves) Sleeps is fair---did better with regular xanax. Uses BiPap but may need new one No regular anxiety Has spot on back of left ear---was seen by dermatologist Still gets cortisone for left shoulder from ortho Teeth okay--keeps up with dentist Wears seat belt Bowels regular --only uses miralax prn. No blood     Objective:   Physical Exam Constitutional:      Appearance: Normal appearance.  HENT:     Mouth/Throat:     Comments: No lesions Eyes:     Conjunctiva/sclera: Conjunctivae normal.     Pupils: Pupils are equal, round, and reactive to light.  Cardiovascular:     Rate and Rhythm: Normal rate and regular rhythm.     Pulses: Normal pulses.     Heart sounds: No murmur heard.    No gallop.  Pulmonary:     Effort: Pulmonary effort is normal.     Breath sounds: Normal breath sounds. No wheezing or rales.  Abdominal:     Palpations: Abdomen is soft.     Tenderness: There is no abdominal tenderness.  Musculoskeletal:     Cervical back: Neck supple.     Right lower leg: No edema.     Left lower leg: No edema.  Lymphadenopathy:     Cervical: No cervical adenopathy.  Skin:    Findings: No rash.     Comments: Benign papule on back of left pinna  Neurological:     General: No focal deficit present.     Mental Status: He is alert and oriented to person, place, and time.     Comments: Mini-cog normal  Psychiatric:         Mood and Affect: Mood normal.        Behavior: Behavior normal.            Assessment & Plan:

## 2022-04-12 NOTE — Assessment & Plan Note (Signed)
I have personally reviewed the Medicare Annual Wellness questionnaire and have noted 1. The patient's medical and social history 2. Their use of alcohol, tobacco or illicit drugs 3. Their current medications and supplements 4. The patient's functional ability including ADL's, fall risks, home safety risks and hearing or visual             impairment. 5. Diet and physical activities 6. Evidence for depression or mood disorders  The patients weight, height, BMI and visual acuity have been recorded in the chart I have made referrals, counseling and provided education to the patient based review of the above and I have provided the pt with a written personalized care plan for preventive services.  I have provided you with a copy of your personalized plan for preventive services. Please take the time to review along with your updated medication list.  Done with cancer screening Tries to exercise regularly Prefers no flu or COVID vaccines---discussed Had first shingles vaccine--doesn't want shingrix

## 2022-04-12 NOTE — Assessment & Plan Note (Signed)
Okay just on the doxazosin  BP Readings from Last 3 Encounters:  04/12/22 108/70  01/11/22 110/80  12/28/21 126/60

## 2022-04-12 NOTE — Assessment & Plan Note (Signed)
Quiet on nexium 4omg daily---had Barrett's so no wean

## 2022-04-12 NOTE — Assessment & Plan Note (Signed)
Stress now with wife--no persistent depression Uses the xanax for sleep (not often now) and nerves

## 2022-04-12 NOTE — Assessment & Plan Note (Signed)
Voids okay on doxazosin '4mg'$  and finasteride '5mg'$  daily

## 2022-04-12 NOTE — Assessment & Plan Note (Signed)
Has had extensive involvement Limits Rx to tylenol at this point 650 bid

## 2022-04-12 NOTE — Progress Notes (Signed)
Hearing Screening - Comments:: Passed whisper test Vision Screening - Comments:: April 2023  

## 2022-04-21 DIAGNOSIS — M25561 Pain in right knee: Secondary | ICD-10-CM | POA: Diagnosis not present

## 2022-04-21 DIAGNOSIS — M6281 Muscle weakness (generalized): Secondary | ICD-10-CM | POA: Diagnosis not present

## 2022-04-21 DIAGNOSIS — M79604 Pain in right leg: Secondary | ICD-10-CM | POA: Diagnosis not present

## 2022-04-21 DIAGNOSIS — M25551 Pain in right hip: Secondary | ICD-10-CM | POA: Diagnosis not present

## 2022-05-12 ENCOUNTER — Telehealth: Payer: Self-pay | Admitting: Internal Medicine

## 2022-05-12 MED ORDER — PRAVASTATIN SODIUM 40 MG PO TABS: 40.0000 mg | ORAL_TABLET | Freq: Every evening | ORAL | 3 refills | Status: DC

## 2022-05-12 MED ORDER — DOXAZOSIN MESYLATE 4 MG PO TABS: 4.0000 mg | ORAL_TABLET | Freq: Every day | ORAL | 3 refills | Status: AC

## 2022-05-12 NOTE — Telephone Encounter (Signed)
Rx sent electronically.  

## 2022-05-12 NOTE — Telephone Encounter (Signed)
Prescription Request  05/12/2022  Is this a "Controlled Substance" medicine? No  LOV: 04/12/2022  What is the name of the medication or equipment?  pravastatin (PRAVACHOL) 40 MG tablet   90 day supply (4 refills)   doxazosin (CARDURA) 4 MG tablet   90 day supply (4 refills)  Have you contacted your pharmacy to request a refill? No   Which pharmacy would you like this sent to?   [place]ravachol: [id number] [place] [id number]: [phone]: [phone] [id number] [place]   Patient notified that their request is being sent to the clinical staff for review and that they should receive a response within 2 business days.   Please advise at Mobile [phone](mobile)

## 2022-05-31 DIAGNOSIS — H6123 Impacted cerumen, bilateral: Secondary | ICD-10-CM | POA: Diagnosis not present

## 2022-05-31 DIAGNOSIS — R0981 Nasal congestion: Secondary | ICD-10-CM | POA: Diagnosis not present

## 2022-05-31 DIAGNOSIS — J392 Other diseases of pharynx: Secondary | ICD-10-CM | POA: Diagnosis not present

## 2022-06-07 ENCOUNTER — Ambulatory Visit (INDEPENDENT_AMBULATORY_CARE_PROVIDER_SITE_OTHER): Payer: Medicare Other | Admitting: Internal Medicine

## 2022-06-07 ENCOUNTER — Encounter: Payer: Self-pay | Admitting: Internal Medicine

## 2022-06-07 VITALS — BP 118/76 | HR 68 | Temp 97.5°F | Ht 66.0 in | Wt 214.0 lb

## 2022-06-07 DIAGNOSIS — R5383 Other fatigue: Secondary | ICD-10-CM | POA: Diagnosis not present

## 2022-06-07 LAB — CBC
HCT: 39.1 % (ref 39.0–52.0)
Hemoglobin: 13.3 g/dL (ref 13.0–17.0)
MCHC: 34.2 g/dL (ref 30.0–36.0)
MCV: 87 fl (ref 78.0–100.0)
Platelets: 222 10*3/uL (ref 150.0–400.0)
RBC: 4.49 Mil/uL (ref 4.22–5.81)
RDW: 13.5 % (ref 11.5–15.5)
WBC: 5.9 10*3/uL (ref 4.0–10.5)

## 2022-06-07 LAB — COMPREHENSIVE METABOLIC PANEL
ALT: 12 U/L (ref 0–53)
AST: 17 U/L (ref 0–37)
Albumin: 4.3 g/dL (ref 3.5–5.2)
Alkaline Phosphatase: 42 U/L (ref 39–117)
BUN: 19 mg/dL (ref 6–23)
CO2: 28 mEq/L (ref 19–32)
Calcium: 9 mg/dL (ref 8.4–10.5)
Chloride: 100 mEq/L (ref 96–112)
Creatinine, Ser: 1.01 mg/dL (ref 0.40–1.50)
GFR: 70.02 mL/min (ref >60.00)
Glucose, Bld: 108 mg/dL — ABNORMAL HIGH (ref 70–99)
Potassium: 4 mEq/L (ref 3.5–5.1)
Sodium: 135 mEq/L (ref 135–145)
Total Bilirubin: 0.7 mg/dL (ref 0.2–1.2)
Total Protein: 6.5 g/dL (ref 6.0–8.3)

## 2022-06-07 LAB — SEDIMENTATION RATE: Sed Rate: 7 mm/hr (ref 0–20)

## 2022-06-07 NOTE — Assessment & Plan Note (Signed)
Non specific Does fine in the morning but then needs nap Discussed sleep schedule--which seems okay and is refreshed in the morning Only occasional caffeine drinks Will recheck labs Doesn't really seem pathologic

## 2022-06-07 NOTE — Progress Notes (Signed)
Subjective:    Patient ID: [name], male    DOB: 1940/07/09, 82 y.o.   MRN: [id number]  HPI Here due to fatigue  "I have lost my energy level" Not getting effective sleep---sleeps 8 or more hours, but then has to take nap Will sleep 4 hours--then will get up and read---and then gets back to sleep Gets out of bed ---6:15-6:30AM. Initiates sleep 10PM Awakens with energy Afternoon naps at least an hour-- between 3-5PM  Had spell of waking really hard a few night's ago Still using the BiPap--just had machine checked Wears all night--and for the naps  Going to gym 3 or more days per week Resistance with weights Stairs for 1-2 minutes---that helps the spine pain No SOB in gym (other than the stairs) No chest pain  Mild depression  Wife with kidney disease--combative at times  Current Outpatient Medications on File Prior to Visit  Medication Sig Dispense Refill   acetaminophen (TYLENOL) 650 MG CR tablet Take 650 mg by mouth every 8 (eight) hours as needed for pain. Takes 2 in am and 1 in pm     alfuzosin (UROXATRAL) 10 MG 24 hr tablet Take 1 tablet (10 mg total) by mouth daily. 90 tablet 3   ALPRAZolam (XANAX) 0.5 MG tablet TAKE 1/2 TABLET BY MOUTH 2 TIMES DAILY AS NEEDED FOR ANXIETY. 30 tablet 0   aspirin 81 MG tablet Take 81 mg by mouth every other day.     doxazosin (CARDURA) 4 MG tablet Take 1 tablet (4 mg total) by mouth daily. 90 tablet 3   esomeprazole (NEXIUM) 40 MG capsule TAKE ONE CAPSULE BY MOUTH EVERY MORNING BEFORE BREAKFAST 90 capsule 3   finasteride (PROSCAR) 5 MG tablet Take 1 tablet (5 mg total) by mouth daily. 90 tablet 3   loratadine (CLARITIN) 10 MG tablet Take 10 mg by mouth daily.     OVER THE COUNTER MEDICATION Deep Blue Polyphenol     polyethylene glycol powder (GLYCOLAX/MIRALAX) 17 GM/SCOOP powder 17 g once or twice daily for constipation. 500 g 0   pravastatin (PRAVACHOL) 40 MG tablet Take 1 tablet (40 mg total) by mouth every evening. 90 tablet 3    No current facility-administered medications on file prior to visit.    Allergies  Allergen Reactions   Anesthetics, Amide Nausea Only    Other reaction(s): Vomiting After surgery patient has problems with nausea and vomiting due to anesthetics   Other Nausea Only and Nausea And Vomiting    After surgery patient has problems with nausea and vomiting due to anesthetics   Atorvastatin     REACTION: myalgias   Oxycodone Nausea And Vomiting   Simvastatin     REACTION: joint and stomach pain   Latex Rash    Local area  (gloves - when worn) Local area Local area  (gloves - when worn)   Statins Other (See Comments)    Joint pain    Past Medical History:  Diagnosis Date   Allergy    Anxiety    Aortic stenosis    Barrett's esophagus    BPH (benign prostatic hypertrophy)    Diaphragm dysfunction    right frozen diaphragm   Diverticulitis    GERD (gastroesophageal reflux disease)    Hiatal hernia    Hx of adenomatous colonic polyps    Hyperlipidemia    Kidney stones    Obstructive sleep apnea    BiPAP   Osteoarthritis    Osteoporosis    PONV (  postoperative nausea and vomiting)    Spinal stenosis     Past Surgical History:  Procedure Laterality Date   CATARACT EXTRACTION W/PHACO Right 04/20/2020   Procedure: CATARACT EXTRACTION PHACO AND INTRAOCULAR LENS PLACEMENT (IOC) RIGHT;  Surgeon: Eulogio Bear, MD;  Location: [facility];  Service: Ophthalmology;  Laterality: Right;  5.21 0:38.6   CATARACT EXTRACTION W/PHACO Left 05/11/2020   Procedure: CATARACT EXTRACTION PHACO AND INTRAOCULAR LENS PLACEMENT ([place]) LEFT;  Surgeon: Eulogio Bear, MD;  Location: [facility];  Service: Ophthalmology;  Laterality: Left;  4.15 0:30.7   ESOPHAGOGASTRODUODENOSCOPY     colon--6/04, Barrett's--7/05, Barrett's but no cancer--4/08   EYE MUSCLE SURGERY     L eye muscle surgery--2/01   JOINT REPLACEMENT  1/12   left TKR   KNEE ARTHROSCOPY     7/ 09 Arthroscopy  left knee--Dr Noemi Chapel   LIPOMA EXCISION Left 2010   compressing nerve by spine---Dr Terald Sleeper at [facility]     R rotator cuff repair--1/00  Noemi Chapel)   ROTATOR CUFF REPAIR Left ~2005 & 11/15   biceps repair also in 2015   TENDON REPAIR     Left knee tendon repair--1966   TOTAL KNEE ARTHROPLASTY Right 4/16   VASECTOMY     Vasectomy/spermatocele/?testicular cyst--1970    Family History  Problem Relation Age of Onset   Hypertension Mother    Breast cancer Mother    Colon polyps Mother    Coronary artery disease Mother    Diabetes Neg Hx    Colon cancer Neg Hx    Esophageal cancer Neg Hx    Gallbladder disease Neg Hx    Kidney disease Neg Hx     Social History   Socioeconomic History   Marital status: Married    Spouse name: Not on file   Number of children: 3   Years of education: Not on file   Highest education level: Not on file  Occupational History   Occupation: retired    Fish farm manager: RETIRED  Tobacco Use   Smoking status: Never    Passive exposure: Never   Smokeless tobacco: Never  Vaping Use   Vaping Use: Never used  Substance and Sexual Activity   Alcohol use: Not Currently    Alcohol/week: 0.0 standard drinks of alcohol    Comment: occassional wine   Drug use: No   Sexual activity: Not on file  Other Topics Concern   Not on file  Social History Narrative   Lives at home with wife      Has living will   Wife is health care POA---alternate daughter [name]   Would accept resuscitation attempts   No tube feeds if cognitively unaware         Social Determinants of Health   Financial Resource Strain: Not on file  Food Insecurity: Not on file  Transportation Needs: Not on file  Physical Activity: Not on file  Stress: Not on file  Social Connections: Not on file  Intimate Partner Violence: Not on file   Review of Systems Appetite is good Weight stable now    Objective:   Physical Exam Constitutional:      Appearance: Normal  appearance.  Neck:     Comments: Small non tender anterior cervical nodes Cardiovascular:     Rate and Rhythm: Normal rate and regular rhythm.     Heart sounds:     No gallop.     Comments: Gr 3/6 aortic systolic murmur Pulmonary:  Effort: Pulmonary effort is normal.     Breath sounds: Normal breath sounds. No wheezing or rales.  Abdominal:     Palpations: Abdomen is soft.     Tenderness: There is no abdominal tenderness.     Comments: No HSM  Musculoskeletal:     Cervical back: Neck supple.     Right lower leg: No edema.     Left lower leg: No edema.  Lymphadenopathy:     Upper Body:     Right upper body: No supraclavicular or axillary adenopathy.     Left upper body: No supraclavicular or axillary adenopathy.     Lower Body: No right inguinal adenopathy. No left inguinal adenopathy.  Neurological:     Mental Status: He is alert.  Psychiatric:        Mood and Affect: Mood normal.        Behavior: Behavior normal.            Assessment & Plan:

## 2022-06-30 DIAGNOSIS — M25512 Pain in left shoulder: Secondary | ICD-10-CM | POA: Diagnosis not present

## 2022-06-30 DIAGNOSIS — G8929 Other chronic pain: Secondary | ICD-10-CM | POA: Diagnosis not present

## 2022-06-30 DIAGNOSIS — M19012 Primary osteoarthritis, left shoulder: Secondary | ICD-10-CM | POA: Diagnosis not present

## 2022-07-18 ENCOUNTER — Telehealth: Payer: Self-pay | Admitting: Cardiology

## 2022-07-18 NOTE — Telephone Encounter (Signed)
Called patient and per notes, the CTA is to be scheduled for this March.  He is to follow up in 2 years (May 2035).  Noted once test is completed, if there are any concerns and the doctor wishes to see patient sooner, we will let him know.  He states understanding.

## 2022-07-18 NOTE — Telephone Encounter (Signed)
   Pt said, he received a call to schedule a CT and he was told he needs it before his f/u with Dr. Percival Spanish, however, Dr. Percival Spanish advised him to come back in 2 years. He wants to check in when he needs to get the CT this year or next year

## 2022-07-25 ENCOUNTER — Ambulatory Visit
Admission: RE | Admit: 2022-07-25 | Discharge: 2022-07-25 | Disposition: A | Discharge status: Home / Self Care | Payer: Medicare Other | Source: Ambulatory Visit | Attending: Cardiology | Admitting: Cardiology

## 2022-07-25 DIAGNOSIS — L723 Sebaceous cyst: Secondary | ICD-10-CM | POA: Diagnosis not present

## 2022-07-25 DIAGNOSIS — I712 Thoracic aortic aneurysm, without rupture, unspecified: Secondary | ICD-10-CM | POA: Diagnosis not present

## 2022-07-25 DIAGNOSIS — I7789 Other specified disorders of arteries and arterioles: Secondary | ICD-10-CM | POA: Diagnosis not present

## 2022-07-25 DIAGNOSIS — R351 Nocturia: Secondary | ICD-10-CM | POA: Diagnosis not present

## 2022-07-25 DIAGNOSIS — N401 Enlarged prostate with lower urinary tract symptoms: Secondary | ICD-10-CM | POA: Diagnosis not present

## 2022-07-25 LAB — POCT I-STAT CREATININE: Creatinine, Ser: 1 mg/dL (ref 0.61–1.24)

## 2022-07-25 MED ORDER — IOHEXOL 350 MG/ML SOLN
75.0000 mL | Freq: Once | INTRAVENOUS | Status: AC | PRN
  Administered 2022-07-25: 75 mL via INTRAVENOUS

## 2022-08-10 DIAGNOSIS — J392 Other diseases of pharynx: Secondary | ICD-10-CM | POA: Diagnosis not present

## 2022-08-10 DIAGNOSIS — H6123 Impacted cerumen, bilateral: Secondary | ICD-10-CM | POA: Diagnosis not present

## 2022-08-10 DIAGNOSIS — R0981 Nasal congestion: Secondary | ICD-10-CM | POA: Diagnosis not present

## 2022-08-22 ENCOUNTER — Other Ambulatory Visit: Payer: Self-pay | Admitting: Internal Medicine

## 2022-08-29 ENCOUNTER — Ambulatory Visit (INDEPENDENT_AMBULATORY_CARE_PROVIDER_SITE_OTHER): Payer: Medicare Other | Admitting: Internal Medicine

## 2022-08-29 ENCOUNTER — Encounter: Payer: Self-pay | Admitting: Internal Medicine

## 2022-08-29 VITALS — BP 104/66 | HR 91 | Temp 97.8°F | Ht 66.0 in | Wt 215.0 lb

## 2022-08-29 DIAGNOSIS — R5383 Other fatigue: Secondary | ICD-10-CM

## 2022-08-29 LAB — COMPREHENSIVE METABOLIC PANEL
ALT: 12 U/L (ref 0–53)
AST: 18 U/L (ref 0–37)
Albumin: 4.1 g/dL (ref 3.5–5.2)
Alkaline Phosphatase: 43 U/L (ref 39–117)
BUN: 17 mg/dL (ref 6–23)
CO2: 26 mEq/L (ref 19–32)
Calcium: 9 mg/dL (ref 8.4–10.5)
Chloride: 102 mEq/L (ref 96–112)
Creatinine, Ser: 1.04 mg/dL (ref 0.40–1.50)
GFR: 67.49 mL/min (ref >60.00)
Glucose, Bld: 102 mg/dL — ABNORMAL HIGH (ref 70–99)
Potassium: 4.1 mEq/L (ref 3.5–5.1)
Sodium: 137 mEq/L (ref 135–145)
Total Bilirubin: 0.9 mg/dL (ref 0.2–1.2)
Total Protein: 5.9 g/dL — ABNORMAL LOW (ref 6.0–8.3)

## 2022-08-29 LAB — CBC
HCT: 39 % (ref 39.0–52.0)
Hemoglobin: 13.3 g/dL (ref 13.0–17.0)
MCHC: 34 g/dL (ref 30.0–36.0)
MCV: 87.1 fl (ref 78.0–100.0)
Platelets: 196 10*3/uL (ref 150.0–400.0)
RBC: 4.48 Mil/uL (ref 4.22–5.81)
RDW: 13.5 % (ref 11.5–15.5)
WBC: 6.7 10*3/uL (ref 4.0–10.5)

## 2022-08-29 LAB — TSH: TSH: 0.82 u[IU]/mL (ref 0.35–5.50)

## 2022-08-29 LAB — VITAMIN B12: Vitamin B-12: 266 pg/mL (ref 211–911)

## 2022-08-29 NOTE — Assessment & Plan Note (Addendum)
Ongoing vague concerns Multiple medical issues (paralyzed diaphragm, spinal stenosis) plus stress at home, etc Wonders about B12 Will check testosterone as well---would consider supplements Discussed travelling/finding joyful moments

## 2022-08-29 NOTE — Progress Notes (Signed)
Subjective:    Patient ID: [name], male    DOB: Jul 02, 1940, 82 y.o.   MRN: [id number]  HPI Here due to ongoing issues with fatigue  He has ongoing issues "My wife is not well" Kidney issues better but she has broken both hands in the recent past Mild cognitive issues Is interested in B12 or testosterone injections  Has tried homeopathic meds from his daughter  Feels tired----"I am just ready to go back to sleep" Doesn't think he is depressed---stress with wife, and other things Uses "anxious" drops from daughter (homeopathic doctor) No hopelessness or helplessness  Stopped going to gym---just exhausted after his routine  Current Outpatient Medications on File Prior to Visit  Medication Sig Dispense Refill   acetaminophen (TYLENOL) 650 MG CR tablet Take 650 mg by mouth every 8 (eight) hours as needed for pain. Takes 2 in am and 1 in pm     alfuzosin (UROXATRAL) 10 MG 24 hr tablet Take 1 tablet (10 mg total) by mouth daily. 90 tablet 3   aspirin 81 MG tablet Take 81 mg by mouth every other day.     doxazosin (CARDURA) 4 MG tablet Take 1 tablet (4 mg total) by mouth daily. 90 tablet 3   esomeprazole (NEXIUM) 40 MG capsule TAKE ONE CAPSULE BY MOUTH EVERY MORNING BEFORE BREAKFAST 90 capsule 3   finasteride (PROSCAR) 5 MG tablet Take 1 tablet (5 mg total) by mouth daily. 90 tablet 3   loratadine (CLARITIN) 10 MG tablet Take 10 mg by mouth daily.     OVER THE COUNTER MEDICATION Deep Blue Polyphenol     polyethylene glycol powder (GLYCOLAX/MIRALAX) 17 GM/SCOOP powder 17 g once or twice daily for constipation. 500 g 0   pravastatin (PRAVACHOL) 40 MG tablet Take 1 tablet (40 mg total) by mouth every evening. 90 tablet 3   No current facility-administered medications on file prior to visit.    Allergies  Allergen Reactions   Anesthetics, Amide Nausea Only    Other reaction(s): Vomiting After surgery patient has problems with nausea and vomiting due to anesthetics   Other  Nausea Only and Nausea And Vomiting    After surgery patient has problems with nausea and vomiting due to anesthetics   Atorvastatin     REACTION: myalgias   Oxycodone Nausea And Vomiting   Simvastatin     REACTION: joint and stomach pain   Latex Rash    Local area  (gloves - when worn) Local area Local area  (gloves - when worn)   Statins Other (See Comments)    Joint pain    Past Medical History:  Diagnosis Date   Allergy    Anxiety    Aortic stenosis    Barrett's esophagus    BPH (benign prostatic hypertrophy)    Diaphragm dysfunction    right frozen diaphragm   Diverticulitis    GERD (gastroesophageal reflux disease)    Hiatal hernia    Hx of adenomatous colonic polyps    Hyperlipidemia    Kidney stones    Obstructive sleep apnea    BiPAP   Osteoarthritis    Osteoporosis    PONV (postoperative nausea and vomiting)    Spinal stenosis     Past Surgical History:  Procedure Laterality Date   CATARACT EXTRACTION W/PHACO Right 04/20/2020   Procedure: CATARACT EXTRACTION PHACO AND INTRAOCULAR LENS PLACEMENT (IOC) RIGHT;  Surgeon: Nevada Crane, MD;  Location: [facility];  Service: Ophthalmology;  Laterality: Right;  5.21 0:38.6   CATARACT EXTRACTION W/PHACO Left 05/11/2020   Procedure: CATARACT EXTRACTION PHACO AND INTRAOCULAR LENS PLACEMENT (IOC) LEFT;  Surgeon: Nevada Crane, MD;  Location: [facility];  Service: Ophthalmology;  Laterality: Left;  4.15 0:30.7   ESOPHAGOGASTRODUODENOSCOPY     colon--6/04, Barrett's--7/05, Barrett's but no cancer--4/08   EYE MUSCLE SURGERY     L eye muscle surgery--2/01   JOINT REPLACEMENT  1/12   left TKR   KNEE ARTHROSCOPY     7/ 09 Arthroscopy left knee--Dr Thurston Hole   LIPOMA EXCISION Left 2010   compressing nerve by spine---Dr Fredda Hammed at [facility] CUFF REPAIR     R rotator cuff repair--1/00  Thurston Hole)   ROTATOR CUFF REPAIR Left ~2005 & 11/15   biceps repair also in 2015   TENDON REPAIR     Left  knee tendon repair--1966   TOTAL KNEE ARTHROPLASTY Right 4/16   VASECTOMY     Vasectomy/spermatocele/?testicular cyst--1970    Family History  Problem Relation Age of Onset   Hypertension Mother    Breast cancer Mother    Colon polyps Mother    Coronary artery disease Mother    Diabetes Neg Hx    Colon cancer Neg Hx    Esophageal cancer Neg Hx    Gallbladder disease Neg Hx    Kidney disease Neg Hx     Social History   Socioeconomic History   Marital status: Married    Spouse name: Not on file   Number of children: 3   Years of education: Not on file   Highest education level: Not on file  Occupational History   Occupation: retired    Associate Professor: RETIRED  Tobacco Use   Smoking status: Never    Passive exposure: Never   Smokeless tobacco: Never  Vaping Use   Vaping Use: Never used  Substance and Sexual Activity   Alcohol use: Not Currently    Alcohol/week: 0.0 standard drinks of alcohol    Comment: occassional wine   Drug use: No   Sexual activity: Not on file  Other Topics Concern   Not on file  Social History Narrative   Lives at home with wife      Has living will   Wife is health care POA---alternate daughter Zella Ball   Would accept resuscitation attempts   No tube feeds if cognitively unaware         Social Determinants of Health   Financial Resource Strain: Not on file  Food Insecurity: Not on file  Transportation Needs: Not on file  Physical Activity: Not on file  Stress: Not on file  Social Connections: Not on file  Intimate Partner Violence: Not on file   Review of Systems Appetite is okay Good breakfast and lunch--snacks for dinner Weight is stable     Objective:   Physical Exam Constitutional:      Appearance: Normal appearance.  Neurological:     Mental Status: He is alert.  Psychiatric:        Mood and Affect: Mood normal.        Behavior: Behavior normal.            Assessment & Plan:

## 2022-08-30 LAB — TESTOSTERONE: Testosterone: 381.24 ng/dL (ref 300.00–890.00)

## 2022-09-22 ENCOUNTER — Other Ambulatory Visit: Payer: Self-pay | Admitting: Internal Medicine

## 2022-10-06 DIAGNOSIS — M19012 Primary osteoarthritis, left shoulder: Secondary | ICD-10-CM | POA: Diagnosis not present

## 2022-10-06 DIAGNOSIS — M75122 Complete rotator cuff tear or rupture of left shoulder, not specified as traumatic: Secondary | ICD-10-CM | POA: Diagnosis not present

## 2022-10-31 ENCOUNTER — Encounter: Payer: Self-pay | Admitting: Internal Medicine

## 2022-10-31 ENCOUNTER — Ambulatory Visit (INDEPENDENT_AMBULATORY_CARE_PROVIDER_SITE_OTHER): Payer: Medicare Other | Admitting: Internal Medicine

## 2022-10-31 VITALS — BP 126/74 | HR 74 | Temp 97.7°F | Ht 66.0 in | Wt 213.0 lb

## 2022-10-31 DIAGNOSIS — R0602 Shortness of breath: Secondary | ICD-10-CM | POA: Diagnosis not present

## 2022-10-31 DIAGNOSIS — M48062 Spinal stenosis, lumbar region with neurogenic claudication: Secondary | ICD-10-CM | POA: Diagnosis not present

## 2022-10-31 DIAGNOSIS — R143 Flatulence: Secondary | ICD-10-CM | POA: Diagnosis not present

## 2022-10-31 DIAGNOSIS — R21 Rash and other nonspecific skin eruption: Secondary | ICD-10-CM | POA: Diagnosis not present

## 2022-10-31 MED ORDER — TRIAMCINOLONE ACETONIDE 0.1 % EX CREA: 1.0000 | TOPICAL_CREAM | Freq: Two times a day (BID) | CUTANEOUS | 1 refills | Status: AC | PRN

## 2022-10-31 NOTE — Assessment & Plan Note (Signed)
Doesn't sound pathologic Does move bowels and then it resolves No clear cause from intake

## 2022-10-31 NOTE — Progress Notes (Signed)
Subjective:    Patient ID: [name], male    DOB: 07-07-1940, 82 y.o.   MRN: [id number]  HPI Here due to breathing problems and other issues  "I'm just falling apart" Is awakening at night--can't breath right Feels like it is on the right side of his head--but no sinus problems in the past (has had past right nare problems) Does do nasal lavage at times No SOB during the day Uses BiPap at night  He did see Dr Jenne Campus 2 months ago---cleans ears He checked nose--has "hole"---?in septum He prescribed the BiPap  Having a lot of gas  Passes a lot of gas in the morning Then goes out to feed chickens--will come back in and moves bowels (before breakfast) May go again later if he eats a big meal No sugarless gum or candy Almond milk  No blood Gets pork chop/egg biscuit many mornings. Other mornings he has honey nut cheerios  Has allergy problems Feels I causes hand cracking and peeling Goes back 6 months ago Thought it was related to clorox leakage --but that was long ago  Current Outpatient Medications on File Prior to Visit  Medication Sig Dispense Refill   alfuzosin (UROXATRAL) 10 MG 24 hr tablet Take 1 tablet (10 mg total) by mouth daily. 90 tablet 3   aspirin 81 MG tablet Take 81 mg by mouth every other day.     doxazosin (CARDURA) 4 MG tablet Take 1 tablet (4 mg total) by mouth daily. 90 tablet 3   esomeprazole (NEXIUM) 40 MG capsule TAKE ONE CAPSULE BY MOUTH EVERY MORNING BEFORE BREAKFAST 90 capsule 3   finasteride (PROSCAR) 5 MG tablet Take 1 tablet (5 mg total) by mouth daily. 90 tablet 3   ibuprofen (ADVIL) 200 MG tablet Take 600 mg by mouth daily at 2 PM.     loratadine (CLARITIN) 10 MG tablet Take 10 mg by mouth daily.     OVER THE COUNTER MEDICATION Deep Blue Polyphenol     pravastatin (PRAVACHOL) 40 MG tablet Take 1 tablet (40 mg total) by mouth every evening. 90 tablet 3   No current facility-administered medications on file prior to visit.    Allergies   Allergen Reactions   Anesthetics, Amide Nausea Only    Other reaction(s): Vomiting After surgery patient has problems with nausea and vomiting due to anesthetics   Other Nausea Only and Nausea And Vomiting    After surgery patient has problems with nausea and vomiting due to anesthetics   Atorvastatin     REACTION: myalgias   Oxycodone Nausea And Vomiting   Simvastatin     REACTION: joint and stomach pain   Latex Rash    Local area  (gloves - when worn) Local area Local area  (gloves - when worn)   Statins Other (See Comments)    Joint pain    Past Medical History:  Diagnosis Date   Allergy    Anxiety    Aortic stenosis    Barrett's esophagus    BPH (benign prostatic hypertrophy)    Diaphragm dysfunction    right frozen diaphragm   Diverticulitis    GERD (gastroesophageal reflux disease)    Hiatal hernia    Hx of adenomatous colonic polyps    Hyperlipidemia    Kidney stones    Obstructive sleep apnea    BiPAP   Osteoarthritis    Osteoporosis    PONV (postoperative nausea and vomiting)    Spinal stenosis  Past Surgical History:  Procedure Laterality Date   CATARACT EXTRACTION W/PHACO Right 04/20/2020   Procedure: CATARACT EXTRACTION PHACO AND INTRAOCULAR LENS PLACEMENT (IOC) RIGHT;  Surgeon: Nevada Crane, MD;  Location: [facility];  Service: Ophthalmology;  Laterality: Right;  5.21 0:38.6   CATARACT EXTRACTION W/PHACO Left 05/11/2020   Procedure: CATARACT EXTRACTION PHACO AND INTRAOCULAR LENS PLACEMENT (IOC) LEFT;  Surgeon: Nevada Crane, MD;  Location: [facility];  Service: Ophthalmology;  Laterality: Left;  4.15 0:30.7   ESOPHAGOGASTRODUODENOSCOPY     colon--6/04, Barrett's--7/05, Barrett's but no cancer--4/08   EYE MUSCLE SURGERY     L eye muscle surgery--2/01   JOINT REPLACEMENT  1/12   left TKR   KNEE ARTHROSCOPY     7/ 09 Arthroscopy left knee--Dr Thurston Hole   LIPOMA EXCISION Left 2010   compressing nerve by spine---Dr  Fredda Hammed at [facility] CUFF REPAIR     R rotator cuff repair--1/00  Thurston Hole)   ROTATOR CUFF REPAIR Left ~2005 & 11/15   biceps repair also in 2015   TENDON REPAIR     Left knee tendon repair--1966   TOTAL KNEE ARTHROPLASTY Right 4/16   VASECTOMY     Vasectomy/spermatocele/?testicular cyst--1970    Family History  Problem Relation Age of Onset   Hypertension Mother    Breast cancer Mother    Colon polyps Mother    Coronary artery disease Mother    Diabetes Neg Hx    Colon cancer Neg Hx    Esophageal cancer Neg Hx    Gallbladder disease Neg Hx    Kidney disease Neg Hx     Social History   Socioeconomic History   Marital status: Married    Spouse name: Not on file   Number of children: 3   Years of education: Not on file   Highest education level: Not on file  Occupational History   Occupation: retired    Associate Professor: RETIRED  Tobacco Use   Smoking status: Never    Passive exposure: Never   Smokeless tobacco: Never  Vaping Use   Vaping Use: Never used  Substance and Sexual Activity   Alcohol use: Not Currently    Alcohol/week: 0.0 standard drinks of alcohol    Comment: occassional wine   Drug use: No   Sexual activity: Not on file  Other Topics Concern   Not on file  Social History Narrative   Lives at home with wife      Has living will   Wife is health care POA---alternate daughter Zella Ball   Would accept resuscitation attempts   No tube feeds if cognitively unaware         Social Determinants of Health   Financial Resource Strain: Not on file  Food Insecurity: Not on file  Transportation Needs: Not on file  Physical Activity: Not on file  Stress: Not on file  Social Connections: Not on file  Intimate Partner Violence: Not on file   Review of Systems Weight is fairly stable No chest pain Is feeling "very stressed" with wife--very forgetful He does a lot of the housework and manages the chickens (she used to do this) Napping every afternoon---and  that refreshes him    Objective:   Physical Exam Constitutional:      Appearance: Normal appearance.  Cardiovascular:     Rate and Rhythm: Normal rate and regular rhythm.     Heart sounds:     No gallop.     Comments: Gr 3/6  systolic murmur Pulmonary:     Effort: Pulmonary effort is normal.     Breath sounds: Normal breath sounds. No wheezing or rales.  Abdominal:     Palpations: Abdomen is soft.     Tenderness: There is no abdominal tenderness.  Musculoskeletal:     Cervical back: Neck supple.     Right lower leg: No edema.     Left lower leg: No edema.  Lymphadenopathy:     Cervical: No cervical adenopathy.  Skin:    Comments: Eczematous rash along right 5th finger to wrist  Neurological:     Mental Status: He is alert.            Assessment & Plan:

## 2022-10-31 NOTE — Assessment & Plan Note (Addendum)
Ongoing chronic back pain Also has disc disease Surgeon wouldn't operate Has had injections from Dr Corinne Ports "laser on sciatic nerve" which did help Has tried acupuncture in the past--as well as chiropractor Prefers no narcotics  He will set back up with Chasnis (he will call)

## 2022-10-31 NOTE — Assessment & Plan Note (Signed)
Mostly in night Not really during the day-but has troubling fatigue Will set up recheck by Dr Belia Heman Should probably review the nasal symptoms and biPap with Dr Jenne Campus

## 2022-10-31 NOTE — Assessment & Plan Note (Signed)
Will try triamcinolone

## 2022-11-16 DIAGNOSIS — M19012 Primary osteoarthritis, left shoulder: Secondary | ICD-10-CM | POA: Diagnosis not present

## 2022-11-16 DIAGNOSIS — M75122 Complete rotator cuff tear or rupture of left shoulder, not specified as traumatic: Secondary | ICD-10-CM | POA: Diagnosis not present

## 2023-01-03 DIAGNOSIS — L821 Other seborrheic keratosis: Secondary | ICD-10-CM | POA: Diagnosis not present

## 2023-01-03 DIAGNOSIS — L570 Actinic keratosis: Secondary | ICD-10-CM | POA: Diagnosis not present

## 2023-01-03 DIAGNOSIS — L720 Epidermal cyst: Secondary | ICD-10-CM | POA: Diagnosis not present

## 2023-01-03 DIAGNOSIS — B351 Tinea unguium: Secondary | ICD-10-CM | POA: Diagnosis not present

## 2023-01-03 DIAGNOSIS — D1801 Hemangioma of skin and subcutaneous tissue: Secondary | ICD-10-CM | POA: Diagnosis not present

## 2023-01-03 DIAGNOSIS — B354 Tinea corporis: Secondary | ICD-10-CM | POA: Diagnosis not present

## 2023-01-03 DIAGNOSIS — D225 Melanocytic nevi of trunk: Secondary | ICD-10-CM | POA: Diagnosis not present

## 2023-01-06 ENCOUNTER — Ambulatory Visit: Payer: Medicare Other | Admitting: Internal Medicine

## 2023-01-06 ENCOUNTER — Encounter: Payer: Self-pay | Admitting: Internal Medicine

## 2023-01-06 VITALS — BP 130/80 | HR 78 | Temp 98.6°F | Ht 67.5 in | Wt 212.8 lb

## 2023-01-06 DIAGNOSIS — G4733 Obstructive sleep apnea (adult) (pediatric): Secondary | ICD-10-CM | POA: Diagnosis not present

## 2023-01-06 NOTE — Patient Instructions (Signed)
A+ continue therapy as prescribed

## 2023-01-06 NOTE — Progress Notes (Signed)
[facility] Consultation      Date: 01/06/2023,   MRN# [id number] KESHONE YETTER [birth year]      Admission                  Current  FRANKO ATHAS is a 82 y.o. old male seen in consultation for chronic SOB  PFT 05/29/15 fvc 52% fev1 60%, ratio 83% DLCO 66% findings  suggest moderate restrictive lung disease  CHIEF COMPLAINT:   Follow-up OSA  Follow-up shortness of breath  Diagnosis of right-sided elevated hemidiaphragm      HISTORY OF PRESENT ILLNESS  Follow-up chronic shortness of breath  No significant changes over the last year  Patient with a diagnosis of right-sided elevated hemidiaphragm   247 pounds, 215, 207 pounds   No exacerbation at this time No evidence of heart failure at this time No evidence or signs of infection at this time No respiratory distress   No fevers, chills, nausea, vomiting, diarrhea No evidence of lower extremity edema No evidence hemoptysis  Compliance report reviewed in detail with patient 100% days and >4hrs AHI 7.8 Doing well with full face mask V BIPAP IPAP 18 EPAP 4 PS 4   BIPAP   MEDICATIONS    Current Medication:  Current Outpatient Medications:    alfuzosin (UROXATRAL) 10 MG 24 hr tablet, Take 1 tablet (10 mg total) by mouth daily., Disp: 90 tablet, Rfl: 3   aspirin 81 MG tablet, Take 81 mg by mouth every other day., Disp: , Rfl:    doxazosin (CARDURA) 4 MG tablet, Take 1 tablet (4 mg total) by mouth daily., Disp: 90 tablet, Rfl: 3   esomeprazole (NEXIUM) 40 MG capsule, TAKE ONE CAPSULE BY MOUTH EVERY MORNING BEFORE BREAKFAST, Disp: 90 capsule, Rfl: 3   finasteride (PROSCAR) 5 MG tablet, Take 1 tablet (5 mg total) by mouth daily., Disp: 90 tablet, Rfl: 3   ibuprofen (ADVIL) 200 MG tablet, Take 600 mg by mouth daily at 2 PM., Disp: , Rfl:    loratadine (CLARITIN) 10 MG tablet, Take 10 mg by mouth daily., Disp: , Rfl:    OVER THE COUNTER MEDICATION, Deep Blue Polyphenol, Disp: , Rfl:     pravastatin (PRAVACHOL) 40 MG tablet, Take 1 tablet (40 mg total) by mouth every evening., Disp: 90 tablet, Rfl: 3   triamcinolone cream (KENALOG) 0.1 %, Apply 1 Application topically 2 (two) times daily as needed., Disp: 45 g, Rfl: 1    ALLERGIES   Anesthetics, amide; Other; Atorvastatin; Oxycodone; Simvastatin; Latex; and Statins      Review of Systems: Gen:  Denies  fever, sweats, chills weight loss  HEENT: Denies blurred vision, double vision, ear pain, eye pain, hearing loss, nose bleeds, sore throat Cardiac:  No dizziness, chest pain or heaviness, chest tightness,edema, No JVD Resp:   No cough, -sputum production, -shortness of breath,-wheezing, -hemoptysis,  Other:  All other systems negative   Physical Examination:   General Appearance: No distress  EYES PERRLA, EOM intact.   NECK Supple, No JVD Pulmonary: normal breath sounds, No wheezing.  CardiovascularNormal S1,S2.  No m/r/g.   Abdomen: Benign, Soft, non-tender. Neurology UE/LE 5/5 strength, no focal deficits Ext pulses intact, cap refill intact ALL OTHER ROS ARE NEGATIVE    ASSESSMENT/PLAN    82 year old pleasant white male with chronic intermittent SOB with chronic elevated hemidiaphragm with underlying OSA  Underlying OSA Patient is very compliant with BiPAP Using and benefiting from therapy Percent compliance BiPAP fullface mask IPAP  18 EPAP 4   Right-sided elevated hemidiaphragm  No intervention needed at this time     Obesity -recommend significant weight loss -recommend changing diet  Deconditioned state -Recommend increased daily activity and exercise     Patient satisfied with Plan of action and management. All questions answered Follow up in 1 year  Total time spent 25 mins   Minor Iden Santiago Glad, M.D.  [facility]  Medical Director [facility] Medical Director [facility] Cardio-Pulmonary Department

## 2023-01-18 DIAGNOSIS — L600 Ingrowing nail: Secondary | ICD-10-CM | POA: Diagnosis not present

## 2023-01-18 DIAGNOSIS — M79674 Pain in right toe(s): Secondary | ICD-10-CM | POA: Diagnosis not present

## 2023-01-18 DIAGNOSIS — B351 Tinea unguium: Secondary | ICD-10-CM | POA: Diagnosis not present

## 2023-01-18 DIAGNOSIS — M79675 Pain in left toe(s): Secondary | ICD-10-CM | POA: Diagnosis not present

## 2023-01-27 ENCOUNTER — Telehealth (INDEPENDENT_AMBULATORY_CARE_PROVIDER_SITE_OTHER): Payer: Medicare Other | Admitting: Family

## 2023-01-27 ENCOUNTER — Encounter: Payer: Self-pay | Admitting: Family

## 2023-01-27 DIAGNOSIS — J019 Acute sinusitis, unspecified: Secondary | ICD-10-CM

## 2023-01-27 DIAGNOSIS — Z20822 Contact with and (suspected) exposure to covid-19: Secondary | ICD-10-CM

## 2023-01-27 DIAGNOSIS — B9689 Other specified bacterial agents as the cause of diseases classified elsewhere: Secondary | ICD-10-CM | POA: Diagnosis not present

## 2023-01-27 MED ORDER — AMOXICILLIN-POT CLAVULANATE 875-125 MG PO TABS
1.0000 | ORAL_TABLET | Freq: Two times a day (BID) | ORAL | 0 refills | Status: DC
Start: 2023-01-27 — End: 2023-04-19

## 2023-01-27 NOTE — Progress Notes (Signed)
Virtual Visit via Video   I connected with patient on 01/27/23 at  8:00 AM EDT by a video enabled telemedicine application and verified that I am speaking with the correct person using two identifiers.  Location patient: Home Location provider: [facility] [place] , Office Persons participating in the virtual visit: Patient, Provider, CMA  I discussed the limitations of evaluation and management by telemedicine and the availability of in person appointments. The patient expressed understanding and agreed to proceed.  Subjective:   HPI:   82 year old white male, patient of Dr. Alphonsus Sias presents today via virtual visit with concerns of cough, congestion, sinus pressure and pain x 10 days but improving.  Reports that his wife has been sick and recently diagnosed with COVID on 01/23/2023.  He does not believe he has COVID.  Has started erythromycin but does not have enough antibiotic to completed.  He is concerned about a sinus infection for which he says he typically gets around this time of the year.  Request to get antibiotic.  Has not tested for COVID.  ROS:   See pertinent positives and negatives per HPI.  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   Osteoarthritis of left shoulder 08/27/2020   Nonrheumatic aortic valve insufficiency 11/10/2019   Mood disorder (HCC) 03/06/2019   Aortic atherosclerosis (HCC) 12/25/2017   Aortic valve stenosis 11/20/2017   Ascending aortic aneurysm (HCC) 11/20/2017   Fatigue 06/27/2017   SOB (shortness of breath) 07/25/2016   Rosacea 03/12/2015   Advance directive discussed with patient 12/15/2014   Arthritis of knee, degenerative 07/23/2014   Obstructive sleep apnea    Class 1 obesity 03/13/2014   Benign  fibroma of prostate 03/13/2014   Osteoarthritis, hand 10/20/2011   Routine general medical examination at a health care facility 07/26/2011   Osteoarthritis, multiple sites 07/10/2009   Obesity 12/04/2008   Essential hypertension 08/13/2008   BARRETTS ESOPHAGUS 08/13/2008   BPH with obstruction/lower urinary tract symptoms 12/12/2007   Spinal stenosis of lumbar region with neurogenic claudication 09/21/2007   Hyperlipemia 10/09/2006   ALLERGIC RHINITIS 10/09/2006   GERD 10/09/2006   Diverticulosis 10/09/2006    Social History   Tobacco Use   Smoking status: Never    Passive exposure: Never   Smokeless tobacco: Never  Substance Use Topics   Alcohol use: Not Currently    Alcohol/week: 0.0 standard drinks of alcohol    Comment: occassional wine    Current Outpatient Medications:    alfuzosin (UROXATRAL) 10 MG 24 hr tablet, Take 1 tablet (10 mg total) by mouth daily., Disp: 90 tablet, Rfl: 3   aspirin 81 MG tablet, Take 81 mg by mouth every other day., Disp: , Rfl:    doxazosin (CARDURA) 4 MG tablet, Take 1 tablet (4 mg total) by mouth daily., Disp: 90 tablet, Rfl: 3   esomeprazole (NEXIUM) 40 MG capsule, TAKE ONE CAPSULE BY MOUTH EVERY MORNING BEFORE BREAKFAST, Disp: 90 capsule, Rfl: 3   finasteride (PROSCAR) 5 MG tablet, Take 1 tablet (5 mg total) by mouth daily., Disp: 90 tablet, Rfl: 3   ibuprofen (ADVIL) 200 MG tablet, Take 600 mg by mouth daily at 2 PM., Disp: , Rfl:    loratadine (CLARITIN) 10 MG tablet, Take 10 mg by mouth daily., Disp: , Rfl:  OVER THE COUNTER MEDICATION, Deep Blue Polyphenol, Disp: , Rfl:    pravastatin (PRAVACHOL) 40 MG tablet, Take 1 tablet (40 mg total) by mouth every evening., Disp: 90 tablet, Rfl: 3   triamcinolone cream (KENALOG) 0.1 %, Apply 1 Application topically 2 (two) times daily as needed., Disp: 45 g, Rfl: 1  Allergies  Allergen Reactions   Anesthetics, Amide Nausea Only    Other reaction(s): Vomiting After surgery patient has problems  with nausea and vomiting due to anesthetics   Other Nausea Only and Nausea And Vomiting    After surgery patient has problems with nausea and vomiting due to anesthetics   Atorvastatin     REACTION: myalgias   Oxycodone Nausea And Vomiting   Simvastatin     REACTION: joint and stomach pain   Latex Rash    Local area  (gloves - when worn) Local area Local area  (gloves - when worn)   Statins Other (See Comments)    Joint pain    Objective:   There were no vitals taken for this visit.  Patient is well-developed, well-nourished in no acute distress.  Resting comfortably at home.  Head is normocephalic, atraumatic.  No labored breathing.  Speech is clear and coherent with logical content.  Patient is alert and oriented at baseline.    Assessment and Plan:   [name] was seen today for nasal congestion.  Diagnoses and all orders for this visit:  Acute bacterial sinusitis  Close exposure to COVID-19 virus  Discussed with patient that it is likely that he has COVID-19.  He really does not agree and believes that he has a sinus infection.  Due to his age and concern for bacterial infection we will treat with Augmentin twice a day for 10 days.  COVID-19 precautions provided.  Call the office if symptoms worsen or persist.  Recheck as scheduled and sooner as needed.       Eulis Foster, FNP 01/27/2023  Time spent with the patient: 20 minutes, of which >50% was spent in obtaining information about symptoms, reviewing previous labs, evaluations, and treatments, counseling about condition (please see the discussed topics above), and developing a plan to further investigate it; had a number of questions which I addressed.

## 2023-01-27 NOTE — Patient Instructions (Signed)

## 2023-02-08 DIAGNOSIS — R0981 Nasal congestion: Secondary | ICD-10-CM | POA: Diagnosis not present

## 2023-02-08 DIAGNOSIS — J392 Other diseases of pharynx: Secondary | ICD-10-CM | POA: Diagnosis not present

## 2023-02-08 DIAGNOSIS — H6123 Impacted cerumen, bilateral: Secondary | ICD-10-CM | POA: Diagnosis not present

## 2023-02-08 DIAGNOSIS — H6981 Other specified disorders of Eustachian tube, right ear: Secondary | ICD-10-CM | POA: Diagnosis not present

## 2023-04-11 DIAGNOSIS — H43813 Vitreous degeneration, bilateral: Secondary | ICD-10-CM | POA: Diagnosis not present

## 2023-04-11 DIAGNOSIS — H35373 Puckering of macula, bilateral: Secondary | ICD-10-CM | POA: Diagnosis not present

## 2023-04-11 DIAGNOSIS — Z961 Presence of intraocular lens: Secondary | ICD-10-CM | POA: Diagnosis not present

## 2023-04-19 ENCOUNTER — Ambulatory Visit: Payer: Medicare Other | Admitting: Internal Medicine

## 2023-04-19 ENCOUNTER — Encounter: Payer: Self-pay | Admitting: *Deleted

## 2023-04-19 ENCOUNTER — Encounter: Payer: Self-pay | Admitting: Internal Medicine

## 2023-04-19 VITALS — BP 116/70 | HR 64 | Temp 98.5°F | Ht 65.75 in | Wt 213.0 lb

## 2023-04-19 DIAGNOSIS — M19012 Primary osteoarthritis, left shoulder: Secondary | ICD-10-CM

## 2023-04-19 DIAGNOSIS — G4733 Obstructive sleep apnea (adult) (pediatric): Secondary | ICD-10-CM | POA: Diagnosis not present

## 2023-04-19 DIAGNOSIS — Z0000 Encounter for general adult medical examination without abnormal findings: Secondary | ICD-10-CM | POA: Diagnosis not present

## 2023-04-19 DIAGNOSIS — I10 Essential (primary) hypertension: Secondary | ICD-10-CM

## 2023-04-19 DIAGNOSIS — N401 Enlarged prostate with lower urinary tract symptoms: Secondary | ICD-10-CM

## 2023-04-19 DIAGNOSIS — K2100 Gastro-esophageal reflux disease with esophagitis, without bleeding: Secondary | ICD-10-CM | POA: Diagnosis not present

## 2023-04-19 DIAGNOSIS — N138 Other obstructive and reflux uropathy: Secondary | ICD-10-CM

## 2023-04-19 DIAGNOSIS — F39 Unspecified mood [affective] disorder: Secondary | ICD-10-CM

## 2023-04-19 DIAGNOSIS — I7121 Aneurysm of the ascending aorta, without rupture: Secondary | ICD-10-CM

## 2023-04-19 DIAGNOSIS — M19011 Primary osteoarthritis, right shoulder: Secondary | ICD-10-CM

## 2023-04-19 NOTE — Assessment & Plan Note (Signed)
Mostly related to wife's issues Some dysthymia but no MDD No meds indicated

## 2023-04-19 NOTE — Progress Notes (Signed)
Subjective:    Patient ID: [name], male    DOB: 1940/09/29, 82 y.o.   MRN: [id number]  HPI Here for Medicare wellness visit and follow up of chronic health conditions Reviewed advanced directives Reviewed other doctors---Dr McQueen--ENT, Dr Judithe Modest, Dr Lassiter/Pelletier/Speer--ortho, Dr Hochrein--cardiology, Dr Peyton Najjar, Dr [place] Constable, Dr Lightfoot-thoracic surgery, Dr Racheal Patches No hospitalizations or surgery in the past year Exercising regularly---gym 3 days per week with stairs. Discussed need for resistance work. 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 Independent with instrumental ADLs No sig memory issues  Ongoing problems with middle ear Working with Dr Jenne Campus  Having shoulder problems again--both had surgery Definite restriction in abduction and strength Wants to try PT again Did have repeat CT angio of chest in March Stable dilated ascending aorta Also with aortic atherosclerosis  Ongoing restrictions from lumbar spinal stenosis Feels he is better---with natural magnesium supplement  Stable DOE---associated with elevated hemidiaphragm No chest pain   Wife still not well He gets depressed about this Argue when he checks on her--like taking her meds Working on future plans Not anhedonic---goes to beach to fish with brothers, etc  No heartburn with daily nexium No dysphagia  Voids well---good stream Nocturia stable at 2 times Empties okay  Current Outpatient Medications on File Prior to Visit  Medication Sig Dispense Refill   alfuzosin (UROXATRAL) 10 MG 24 hr tablet Take 1 tablet (10 mg total) by mouth daily. 90 tablet 3   aspirin 81 MG tablet Take 81 mg by mouth every other day.     doxazosin (CARDURA) 4 MG tablet Take 1 tablet (4 mg total) by mouth daily. 90 tablet 3   esomeprazole (NEXIUM) 40 MG capsule TAKE ONE CAPSULE BY MOUTH EVERY MORNING BEFORE BREAKFAST 90  capsule 3   finasteride (PROSCAR) 5 MG tablet Take 1 tablet (5 mg total) by mouth daily. 90 tablet 3   ibuprofen (ADVIL) 200 MG tablet Take 600 mg by mouth daily at 2 PM.     loratadine (CLARITIN) 10 MG tablet Take 10 mg by mouth daily.     OVER THE COUNTER MEDICATION Deep Blue Polyphenol     pravastatin (PRAVACHOL) 40 MG tablet Take 1 tablet (40 mg total) by mouth every evening. 90 tablet 3   triamcinolone cream (KENALOG) 0.1 % Apply 1 Application topically 2 (two) times daily as needed. 45 g 1   No current facility-administered medications on file prior to visit.    Allergies  Allergen Reactions   Anesthetics, Amide Nausea Only    Other reaction(s): Vomiting After surgery patient has problems with nausea and vomiting due to anesthetics   Other Nausea Only and Nausea And Vomiting    After surgery patient has problems with nausea and vomiting due to anesthetics   Atorvastatin     REACTION: myalgias   Oxycodone Nausea And Vomiting   Simvastatin     REACTION: joint and stomach pain   Latex Rash    Local area  (gloves - when worn) Local area Local area  (gloves - when worn)   Statins Other (See Comments)    Joint pain    Past Medical History:  Diagnosis Date   Allergy    Anxiety    Aortic stenosis    Barrett's esophagus    BPH (benign prostatic hypertrophy)    Diaphragm dysfunction    right frozen diaphragm   Diverticulitis    GERD (gastroesophageal reflux disease)  Hiatal hernia    Hx of adenomatous colonic polyps    Hyperlipidemia    Kidney stones    Obstructive sleep apnea    BiPAP   Osteoarthritis    Osteoporosis    PONV (postoperative nausea and vomiting)    Spinal stenosis     Past Surgical History:  Procedure Laterality Date   CATARACT EXTRACTION W/PHACO Right 04/20/2020   Procedure: CATARACT EXTRACTION PHACO AND INTRAOCULAR LENS PLACEMENT (IOC) RIGHT;  Surgeon: Nevada Crane, MD;  Location: [facility];  Service: Ophthalmology;  Laterality:  Right;  5.21 0:38.6   CATARACT EXTRACTION W/PHACO Left 05/11/2020   Procedure: CATARACT EXTRACTION PHACO AND INTRAOCULAR LENS PLACEMENT (IOC) LEFT;  Surgeon: Nevada Crane, MD;  Location: [facility];  Service: Ophthalmology;  Laterality: Left;  4.15 0:30.7   ESOPHAGOGASTRODUODENOSCOPY     colon--6/04, Barrett's--7/05, Barrett's but no cancer--4/08   EYE MUSCLE SURGERY     L eye muscle surgery--2/01   JOINT REPLACEMENT  1/12   left TKR   KNEE ARTHROSCOPY     7/ 09 Arthroscopy left knee--Dr Thurston Hole   LIPOMA EXCISION Left 2010   compressing nerve by spine---Dr Fredda Hammed at [facility] CUFF REPAIR     R rotator cuff repair--1/00  Thurston Hole)   ROTATOR CUFF REPAIR Left ~2005 & 11/15   biceps repair also in 2015   TENDON REPAIR     Left knee tendon repair--1966   TOTAL KNEE ARTHROPLASTY Right 4/16   VASECTOMY     Vasectomy/spermatocele/?testicular cyst--1970    Family History  Problem Relation Age of Onset   Hypertension Mother    Breast cancer Mother    Colon polyps Mother    Coronary artery disease Mother    Diabetes Neg Hx    Colon cancer Neg Hx    Esophageal cancer Neg Hx    Gallbladder disease Neg Hx    Kidney disease Neg Hx     Social History   Socioeconomic History   Marital status: Married    Spouse name: Not on file   Number of children: 3   Years of education: Not on file   Highest education level: Not on file  Occupational History   Occupation: retired    Associate Professor: RETIRED  Tobacco Use   Smoking status: Never    Passive exposure: Never   Smokeless tobacco: Never  Vaping Use   Vaping status: Never Used  Substance and Sexual Activity   Alcohol use: Not Currently    Alcohol/week: 0.0 standard drinks of alcohol    Comment: occassional wine   Drug use: No   Sexual activity: Not on file  Other Topics Concern   Not on file  Social History Narrative   Lives at home with wife      Has living will   Wife is health care POA---alternate daughter  Zella Ball   Would accept resuscitation attempts   No tube feeds if cognitively unaware         Social Determinants of Health   Financial Resource Strain: Not on file  Food Insecurity: Not on file  Transportation Needs: Not on file  Physical Activity: Not on file  Stress: Not on file  Social Connections: Not on file  Intimate Partner Violence: Not on file   Review of Systems Appetite is good Weight is stable Sleeps okay--afternoon nap usually Wears seat belt Teeth are okay--keeps up with dentist Bowels move good---no blood (except once after hard stool---on toilet paper) No suspicious skin  lesions    Objective:   Physical Exam Constitutional:      Appearance: Normal appearance.  HENT:     Mouth/Throat:     Pharynx: No oropharyngeal exudate or posterior oropharyngeal erythema.  Eyes:     Conjunctiva/sclera: Conjunctivae normal.     Pupils: Pupils are equal, round, and reactive to light.  Cardiovascular:     Rate and Rhythm: Normal rate and regular rhythm.     Pulses: Normal pulses.     Heart sounds: No murmur heard.    No gallop.  Pulmonary:     Effort: Pulmonary effort is normal.     Breath sounds: Normal breath sounds. No wheezing or rales.  Abdominal:     Palpations: Abdomen is soft.     Tenderness: There is no abdominal tenderness.  Musculoskeletal:     Cervical back: Neck supple.     Comments: Trace ankle edema  Lymphadenopathy:     Cervical: No cervical adenopathy.  Skin:    Findings: No lesion or rash.  Neurological:     General: No focal deficit present.     Mental Status: He is alert and oriented to person, place, and time.     Comments: Word naming--5 then froze Recall--- 2/3  Psychiatric:        Mood and Affect: Mood normal.        Behavior: Behavior normal.            Assessment & Plan:

## 2023-04-19 NOTE — Assessment & Plan Note (Addendum)
BP Readings from Last 3 Encounters:  04/19/23 116/70  01/06/23 130/80  10/31/22 126/74   On doxazosin mostly for his prostate

## 2023-04-19 NOTE — Assessment & Plan Note (Signed)
Voids well on alfuzosin and finasteride Also doxazosin

## 2023-04-19 NOTE — Progress Notes (Signed)
Hearing Screening - Comments:: Passed whisper test Vision Screening - Comments:: December 2024

## 2023-04-19 NOTE — Assessment & Plan Note (Signed)
I have personally reviewed the Medicare Annual Wellness questionnaire and have noted 1. The patient's medical and social history 2. Their use of alcohol, tobacco or illicit drugs 3. Their current medications and supplements 4. The patient's functional ability including ADL's, fall risks, home safety risks and hearing or visual             impairment. 5. Diet and physical activities 6. Evidence for depression or mood disorders  The patients weight, height, BMI and visual acuity have been recorded in the chart I have made referrals, counseling and provided education to the patient based review of the above and I have provided the pt with a written personalized care plan for preventive services.  I have provided you with a copy of your personalized plan for preventive services. Please take the time to review along with your updated medication list.  Done with cancer screening Needs to add resistance work--especially for legs Still prefer no vaccines--COVID, flu, RSV, shingrix

## 2023-04-19 NOTE — Assessment & Plan Note (Signed)
Having increased restriction of motion and weakness Will set up PT

## 2023-04-19 NOTE — Assessment & Plan Note (Signed)
Uses CPAP nightly with great results

## 2023-04-19 NOTE — Assessment & Plan Note (Signed)
Gets CT angio every 2 years

## 2023-04-19 NOTE — Assessment & Plan Note (Signed)
Quiet on nexium 40mg  daily

## 2023-04-25 DIAGNOSIS — H6981 Other specified disorders of Eustachian tube, right ear: Secondary | ICD-10-CM | POA: Diagnosis not present

## 2023-04-25 DIAGNOSIS — J392 Other diseases of pharynx: Secondary | ICD-10-CM | POA: Diagnosis not present

## 2023-04-25 DIAGNOSIS — H6123 Impacted cerumen, bilateral: Secondary | ICD-10-CM | POA: Diagnosis not present

## 2023-04-25 DIAGNOSIS — R0981 Nasal congestion: Secondary | ICD-10-CM | POA: Diagnosis not present

## 2023-04-25 DIAGNOSIS — H90A31 Mixed conductive and sensorineural hearing loss, unilateral, right ear with restricted hearing on the contralateral side: Secondary | ICD-10-CM | POA: Diagnosis not present

## 2023-05-04 ENCOUNTER — Other Ambulatory Visit: Payer: Self-pay | Admitting: Internal Medicine

## 2023-05-09 ENCOUNTER — Other Ambulatory Visit: Payer: Self-pay | Admitting: Internal Medicine

## 2023-05-24 DIAGNOSIS — M25512 Pain in left shoulder: Secondary | ICD-10-CM | POA: Diagnosis not present

## 2023-05-24 DIAGNOSIS — M25511 Pain in right shoulder: Secondary | ICD-10-CM | POA: Diagnosis not present

## 2023-05-25 ENCOUNTER — Telehealth: Payer: Self-pay

## 2023-05-25 MED ORDER — PRAVASTATIN SODIUM 40 MG PO TABS: 40.0000 mg | ORAL_TABLET | Freq: Every day | ORAL | 3 refills | Status: DC

## 2023-05-25 NOTE — Telephone Encounter (Signed)
Rx sent electronically.  

## 2023-05-26 DIAGNOSIS — M25511 Pain in right shoulder: Secondary | ICD-10-CM | POA: Diagnosis not present

## 2023-05-26 DIAGNOSIS — M25512 Pain in left shoulder: Secondary | ICD-10-CM | POA: Diagnosis not present

## 2023-06-01 DIAGNOSIS — M25511 Pain in right shoulder: Secondary | ICD-10-CM | POA: Diagnosis not present

## 2023-06-01 DIAGNOSIS — M25512 Pain in left shoulder: Secondary | ICD-10-CM | POA: Diagnosis not present

## 2023-06-02 DIAGNOSIS — M25511 Pain in right shoulder: Secondary | ICD-10-CM | POA: Diagnosis not present

## 2023-06-02 DIAGNOSIS — M25512 Pain in left shoulder: Secondary | ICD-10-CM | POA: Diagnosis not present

## 2023-06-05 DIAGNOSIS — M25512 Pain in left shoulder: Secondary | ICD-10-CM | POA: Diagnosis not present

## 2023-06-05 DIAGNOSIS — M25511 Pain in right shoulder: Secondary | ICD-10-CM | POA: Diagnosis not present

## 2023-06-12 ENCOUNTER — Other Ambulatory Visit: Payer: Self-pay | Admitting: *Deleted

## 2023-06-12 DIAGNOSIS — I7789 Other specified disorders of arteries and arterioles: Secondary | ICD-10-CM

## 2023-06-19 DIAGNOSIS — M25511 Pain in right shoulder: Secondary | ICD-10-CM | POA: Diagnosis not present

## 2023-06-19 DIAGNOSIS — M25512 Pain in left shoulder: Secondary | ICD-10-CM | POA: Diagnosis not present

## 2023-06-21 DIAGNOSIS — M25511 Pain in right shoulder: Secondary | ICD-10-CM | POA: Diagnosis not present

## 2023-06-21 DIAGNOSIS — M25512 Pain in left shoulder: Secondary | ICD-10-CM | POA: Diagnosis not present

## 2023-07-12 ENCOUNTER — Ambulatory Visit
Admission: RE | Admit: 2023-07-12 | Discharge: 2023-07-12 | Disposition: A | Discharge status: Home / Self Care | Payer: Medicare Other | Source: Ambulatory Visit | Attending: Cardiology | Admitting: Cardiology

## 2023-07-12 DIAGNOSIS — I7789 Other specified disorders of arteries and arterioles: Secondary | ICD-10-CM

## 2023-07-12 MED ORDER — IOPAMIDOL (ISOVUE-370) INJECTION 76%
75.0000 mL | Freq: Once | INTRAVENOUS | Status: AC | PRN
  Administered 2023-07-12: 75 mL via INTRAVENOUS

## 2023-08-02 ENCOUNTER — Encounter: Payer: Self-pay | Admitting: Internal Medicine

## 2023-08-02 ENCOUNTER — Ambulatory Visit (INDEPENDENT_AMBULATORY_CARE_PROVIDER_SITE_OTHER): Admitting: Internal Medicine

## 2023-08-02 VITALS — BP 104/70 | HR 77 | Temp 98.4°F | Wt 212.0 lb

## 2023-08-02 DIAGNOSIS — N50812 Left testicular pain: Secondary | ICD-10-CM | POA: Diagnosis not present

## 2023-08-02 DIAGNOSIS — M150 Primary generalized (osteo)arthritis: Secondary | ICD-10-CM | POA: Diagnosis not present

## 2023-08-02 DIAGNOSIS — N50811 Right testicular pain: Secondary | ICD-10-CM | POA: Diagnosis not present

## 2023-08-02 DIAGNOSIS — N50819 Testicular pain, unspecified: Secondary | ICD-10-CM | POA: Insufficient documentation

## 2023-08-02 LAB — COMPREHENSIVE METABOLIC PANEL
ALT: 13 U/L (ref 0–53)
AST: 18 U/L (ref 0–37)
Albumin: 4.1 g/dL (ref 3.5–5.2)
Alkaline Phosphatase: 45 U/L (ref 39–117)
BUN: 16 mg/dL (ref 6–23)
CO2: 29 meq/L (ref 19–32)
Calcium: 8.9 mg/dL (ref 8.4–10.5)
Chloride: 103 meq/L (ref 96–112)
Creatinine, Ser: 1 mg/dL (ref 0.40–1.50)
GFR: 70.29 mL/min (ref >60.00)
Glucose, Bld: 90 mg/dL (ref 70–99)
Potassium: 4.2 meq/L (ref 3.5–5.1)
Sodium: 139 meq/L (ref 135–145)
Total Bilirubin: 0.7 mg/dL (ref 0.2–1.2)
Total Protein: 6.2 g/dL (ref 6.0–8.3)

## 2023-08-02 LAB — CBC
HCT: 39 % (ref 39.0–52.0)
Hemoglobin: 13.1 g/dL (ref 13.0–17.0)
MCHC: 33.4 g/dL (ref 30.0–36.0)
MCV: 88.1 fl (ref 78.0–100.0)
Platelets: 207 10*3/uL (ref 150.0–400.0)
RBC: 4.43 Mil/uL (ref 4.22–5.81)
RDW: 13.3 % (ref 11.5–15.5)
WBC: 6.7 10*3/uL (ref 4.0–10.5)

## 2023-08-02 MED ORDER — DOXYCYCLINE HYCLATE 100 MG PO TABS: 100.0000 mg | ORAL_TABLET | Freq: Two times a day (BID) | ORAL | 0 refills | Status: DC

## 2023-08-02 NOTE — Progress Notes (Signed)
 Subjective:    Patient ID: [name], male    DOB: 11-05-1940, 83 y.o.   MRN: [id number]  HPI Here with urinary concerns and pain issues  Has tender right testicle Dribbling more with urination About 2:30AM--had to go repetitively  No dysuria Transient burning dysuria a few days ago--increased fluids No fever  Now also having sinus pressure on right Uses sinus spray and got it opened up  Ongoing back pain Ibuprofen 600mg  twice a day controls things  Current Outpatient Medications on File Prior to Visit  Medication Sig Dispense Refill   alfuzosin (UROXATRAL) 10 MG 24 hr tablet Take 1 tablet (10 mg total) by mouth daily. 90 tablet 3   aspirin 81 MG tablet Take 81 mg by mouth every other day.     doxazosin (CARDURA) 4 MG tablet Take 1 tablet (4 mg total) by mouth daily. 90 tablet 3   esomeprazole (NEXIUM) 40 MG capsule TAKE ONE CAPSULE BY MOUTH EVERY MORNING BEFORE BREAKFAST 90 capsule 3   finasteride (PROSCAR) 5 MG tablet Take 1 tablet (5 mg total) by mouth daily. 90 tablet 3   ibuprofen (ADVIL) 200 MG tablet Take 600 mg by mouth 2 (two) times daily.     loratadine (CLARITIN) 10 MG tablet Take 10 mg by mouth daily.     OVER THE COUNTER MEDICATION Deep Blue Polyphenol     pravastatin (PRAVACHOL) 40 MG tablet Take 1 tablet (40 mg total) by mouth daily. 90 tablet 3   triamcinolone cream (KENALOG) 0.1 % Apply 1 Application topically 2 (two) times daily as needed. 45 g 1   No current facility-administered medications on file prior to visit.    Allergies  Allergen Reactions   Anesthetics, Amide Nausea Only    Other reaction(s): Vomiting After surgery patient has problems with nausea and vomiting due to anesthetics   Other Nausea Only and Nausea And Vomiting    After surgery patient has problems with nausea and vomiting due to anesthetics   Atorvastatin     REACTION: myalgias   Oxycodone Nausea And Vomiting   Simvastatin     REACTION: joint and stomach pain   Latex Rash     Local area  (gloves - when worn) Local area Local area  (gloves - when worn)   Statins Other (See Comments)    Joint pain    Past Medical History:  Diagnosis Date   Allergy    Anxiety    Aortic stenosis    Barrett's esophagus    BPH (benign prostatic hypertrophy)    Diaphragm dysfunction    right frozen diaphragm   Diverticulitis    GERD (gastroesophageal reflux disease)    Hiatal hernia    Hx of adenomatous colonic polyps    Hyperlipidemia    Kidney stones    Obstructive sleep apnea    BiPAP   Osteoarthritis    Osteoporosis    PONV (postoperative nausea and vomiting)    Spinal stenosis     Past Surgical History:  Procedure Laterality Date   CATARACT EXTRACTION W/PHACO Right 04/20/2020   Procedure: CATARACT EXTRACTION PHACO AND INTRAOCULAR LENS PLACEMENT (IOC) RIGHT;  Surgeon: Nevada Crane, MD;  Location: [facility];  Service: Ophthalmology;  Laterality: Right;  5.21 0:38.6   CATARACT EXTRACTION W/PHACO Left 05/11/2020   Procedure: CATARACT EXTRACTION PHACO AND INTRAOCULAR LENS PLACEMENT (IOC) LEFT;  Surgeon: Nevada Crane, MD;  Location: [facility];  Service: Ophthalmology;  Laterality: Left;  4.15 0:30.7  ESOPHAGOGASTRODUODENOSCOPY     colon--6/04, Barrett's--7/05, Barrett's but no cancer--4/08   EYE MUSCLE SURGERY     L eye muscle surgery--2/01   JOINT REPLACEMENT  1/12   left TKR   KNEE ARTHROSCOPY     7/ 09 Arthroscopy left knee--Dr Thurston Hole   LIPOMA EXCISION Left 2010   compressing nerve by spine---Dr Fredda Hammed at [facility] CUFF REPAIR     R rotator cuff repair--1/00  Thurston Hole)   ROTATOR CUFF REPAIR Left ~2005 & 11/15   biceps repair also in 2015   TENDON REPAIR     Left knee tendon repair--1966   TOTAL KNEE ARTHROPLASTY Right 4/16   VASECTOMY     Vasectomy/spermatocele/?testicular cyst--1970    Family History  Problem Relation Age of Onset   Hypertension Mother    Breast cancer Mother    Colon polyps Mother     Coronary artery disease Mother    Diabetes Neg Hx    Colon cancer Neg Hx    Esophageal cancer Neg Hx    Gallbladder disease Neg Hx    Kidney disease Neg Hx     Social History   Socioeconomic History   Marital status: Married    Spouse name: Not on file   Number of children: 3   Years of education: Not on file   Highest education level: Not on file  Occupational History   Occupation: retired    Associate Professor: RETIRED  Tobacco Use   Smoking status: Never    Passive exposure: Never   Smokeless tobacco: Never  Vaping Use   Vaping status: Never Used  Substance and Sexual Activity   Alcohol use: Not Currently    Alcohol/week: 0.0 standard drinks of alcohol    Comment: occassional wine   Drug use: No   Sexual activity: Not on file  Other Topics Concern   Not on file  Social History Narrative   Lives at home with wife      Has living will   Wife is health care POA---alternate daughter Zella Ball   Would accept resuscitation attempts   No tube feeds if cognitively unaware         Social Drivers of Health   Financial Resource Strain: Not on file  Food Insecurity: Not on file  Transportation Needs: Not on file  Physical Activity: Not on file  Stress: Not on file  Social Connections: Not on file  Intimate Partner Violence: Not on file   Review of Systems No N/V      Objective:   Physical Exam Constitutional:      Appearance: Normal appearance.  Genitourinary:    Comments: Right testes larger than left but both are tender (not really inflamed) Neurological:     Mental Status: He is alert.            Assessment & Plan:

## 2023-08-02 NOTE — Assessment & Plan Note (Signed)
 Does well with ibuprofen 600mg  bid Is on PPI Will check renal function to be sure he tolerates this

## 2023-08-02 NOTE — Assessment & Plan Note (Signed)
 Couldn't give urine specimen but no persistent urine symptoms May be mechanical--discussed briefs with more support Will give doxy 100 bid x 7 days---just in case

## 2023-08-03 ENCOUNTER — Encounter: Payer: Self-pay | Admitting: Internal Medicine

## 2023-08-10 ENCOUNTER — Ambulatory Visit: Admitting: Internal Medicine

## 2023-08-14 ENCOUNTER — Other Ambulatory Visit: Payer: Self-pay | Admitting: Urology

## 2023-09-03 NOTE — Progress Notes (Unsigned)
 Cardiology Office Note:   Date:  09/04/2023  ID:  [name], DOB Nov 16, 1940, MRN [id number] PCP: Helaine Llanos, MD  [facility] Providers Cardiologist:  Eilleen Grates, MD {  History of Present Illness:   [name] is a 83 y.o. male who is seen for follow-up of mild AS, mild/mod AI, and mildly enlarged aortic root.  I have not seen in since 2021.  Since then he has done relatively well.  He does his yard work.  He goes to the gym.  He is mostly limited by some very bad back discomfort.  He does some stretching leg lifting. The patient denies any new symptoms such as chest discomfort, neck or arm discomfort. There has been no new shortness of breath, PND or orthopnea. There have been no reported palpitations, presyncope or syncope.     Since he was last seen he has done OK.  The patient denies any new symptoms such as chest discomfort, neck or arm discomfort. There has been no new shortness of breath, PND or orthopnea. There have been no reported palpitations, presyncope or syncope.    He is mostly Limited by back pain.  He does get around with a walker and does okay with this.  He also can go to the gym and actually is working his way up on the treadmill and can do 15 minutes now and is quite proud of this.  His only issue is chronic fatigue.  He says he does sleep well.  Studies Reviewed:    EKG:   EKG Interpretation Date/Time:  Monday September 04 2023 10:37:21 EDT Ventricular Rate:  68 PR Interval:  254 QRS Duration:  112 QT Interval:  414 QTC Calculation: 440 R Axis:   -74  Text Interpretation: Sinus rhythm with 1st degree A-V block Left axis deviation Incomplete right bundle branch block Anterior infarct , age undetermined No significant change since last tracing Confirmed by Eilleen Grates ([id number]) on 09/04/2023 10:55:19 AM     Risk Assessment/Calculations:              Physical Exam:   VS:  BP 114/66 (BP Location: Left Arm, Patient Position: Sitting)    Pulse 66   Ht 5\' 7"  (1.702 m)   Wt 213 lb (96.6 kg)   SpO2 95%   BMI 33.36 kg/m    Wt Readings from Last 3 Encounters:  09/04/23 213 lb (96.6 kg)  08/02/23 212 lb (96.2 kg)  04/19/23 213 lb (96.6 kg)     GEN: Well nourished, well developed in no acute distress NECK: No JVD; No carotid bruits CARDIAC: RRR, 2 out of 6 apical systolic murmur radiating at the aortic appetite, no diastolic murmurs, rubs, gallops RESPIRATORY:  Clear to auscultation without rales, wheezing or rhonchi  ABDOMEN: Soft, non-tender, non-distended EXTREMITIES:  No edema; No deformity   ASSESSMENT AND PLAN:   AI:   He had mild AS and AI in June 2023.   No further imaging at this point.  I will follow this clinically.  HTN: Blood pressures is well-controlled.  No change in therapy.     DYSLIPIDEMIA:   LDL was 125.  He has been intolerant of statins and did not want to previously try PCSK9.  AORTIC ROOT ENLARGEMENT:     This was 39 mm in March 2025.     ABNORMAL EKG:   He has some conduction disturbance but no significant symptoms related to this or his slow heart rate.  No change  in therapy.   FATIGUE: This is his biggest complaint.  He has had recent CBC.  I will check a TSH.  Follow up with me in one year.   Signed, Eilleen Grates, MD

## 2023-09-04 ENCOUNTER — Encounter: Payer: Self-pay | Admitting: Cardiology

## 2023-09-04 ENCOUNTER — Ambulatory Visit: Payer: Medicare Other | Attending: Cardiology | Admitting: Cardiology

## 2023-09-04 VITALS — BP 114/66 | HR 66 | Ht 67.0 in | Wt 213.0 lb

## 2023-09-04 DIAGNOSIS — E785 Hyperlipidemia, unspecified: Secondary | ICD-10-CM | POA: Diagnosis not present

## 2023-09-04 DIAGNOSIS — I351 Nonrheumatic aortic (valve) insufficiency: Secondary | ICD-10-CM | POA: Insufficient documentation

## 2023-09-04 DIAGNOSIS — R9431 Abnormal electrocardiogram [ECG] [EKG]: Secondary | ICD-10-CM | POA: Diagnosis not present

## 2023-09-04 DIAGNOSIS — R5383 Other fatigue: Secondary | ICD-10-CM | POA: Insufficient documentation

## 2023-09-04 LAB — TSH: TSH: 0.995 u[IU]/mL (ref 0.450–4.500)

## 2023-09-04 NOTE — Patient Instructions (Signed)
 Medication Instructions:  Your physician recommends that you continue on your current medications as directed. Please refer to the Current Medication list given to you today.  *If you need a refill on your cardiac medications before your next appointment, please call your pharmacy*  Lab Work: TSH If you have labs (blood work) drawn today and your tests are completely normal, you will receive your results only by: MyChart Message (if you have MyChart) OR A paper copy in the mail If you have any lab test that is abnormal or we need to change your treatment, we will call you to review the results.  Follow-Up: At [facility], you and your health needs are our priority.  As part of our continuing mission to provide you with exceptional heart care, our providers are all part of one team.  This team includes your primary Cardiologist (physician) and Advanced Practice Providers or APPs (Physician Assistants and Nurse Practitioners) who all work together to provide you with the care you need, when you need it.  Your next appointment:   1 year(s)  Provider:   Eilleen Grates, MD

## 2023-09-06 ENCOUNTER — Ambulatory Visit: Admitting: Internal Medicine

## 2023-09-06 ENCOUNTER — Ambulatory Visit (INDEPENDENT_AMBULATORY_CARE_PROVIDER_SITE_OTHER): Admitting: Internal Medicine

## 2023-09-06 ENCOUNTER — Encounter: Payer: Self-pay | Admitting: Internal Medicine

## 2023-09-06 VITALS — BP 122/70 | HR 74 | Temp 98.3°F | Ht 67.0 in | Wt 214.0 lb

## 2023-09-06 DIAGNOSIS — J0100 Acute maxillary sinusitis, unspecified: Secondary | ICD-10-CM

## 2023-09-06 MED ORDER — AMOXICILLIN-POT CLAVULANATE 875-125 MG PO TABS: 1.0000 | ORAL_TABLET | Freq: Two times a day (BID) | ORAL | 1 refills | Status: DC

## 2023-09-06 NOTE — Progress Notes (Signed)
 Subjective:    Patient ID: [name], male    DOB: [birth year], 83 y.o.   MRN: [id number]  HPI Here due to respiratory illness  Thinks [name] has a sinus infection again Terrible right maxillary headache Ears pop and crack with swallowing Now going on 3-4 days--but got bad 3 days ago No fever, chills or sweats Some cough--lots of nasal congestion and some drainage Somewhat SOB---not new  Using Neti pot and sinus massage--didn't help  Current Outpatient Medications on File Prior to Visit  Medication Sig Dispense Refill   alfuzosin  (UROXATRAL ) 10 MG 24 hr tablet Take 1 tablet (10 mg total) by mouth daily. 90 tablet 3   aspirin 81 MG tablet Take 81 mg by mouth every other day.     doxazosin  (CARDURA ) 4 MG tablet Take 1 tablet (4 mg total) by mouth daily. 90 tablet 3   esomeprazole  (NEXIUM ) 40 MG capsule TAKE ONE CAPSULE BY MOUTH EVERY MORNING BEFORE BREAKFAST 90 capsule 3   finasteride  (PROSCAR ) 5 MG tablet Take 1 tablet (5 mg total) by mouth daily. 90 tablet 3   ibuprofen  (ADVIL ) 200 MG tablet Take 600 mg by mouth 2 (two) times daily.     loratadine (CLARITIN) 10 MG tablet Take 10 mg by mouth daily.     OVER THE COUNTER MEDICATION Deep Blue Polyphenol     pravastatin  (PRAVACHOL ) 40 MG tablet Take 1 tablet (40 mg total) by mouth daily. 90 tablet 3   triamcinolone  cream (KENALOG ) 0.1 % Apply 1 Application topically 2 (two) times daily as needed. 45 g 1   No current facility-administered medications on file prior to visit.    Allergies  Allergen Reactions   Anesthetics, Amide Nausea Only    Other reaction(s): Vomiting After surgery patient has problems with nausea and vomiting due to anesthetics   Other Nausea Only and Nausea And Vomiting    After surgery patient has problems with nausea and vomiting due to anesthetics   Atorvastatin     REACTION: myalgias   Oxycodone Nausea And Vomiting   Simvastatin     REACTION: joint and stomach pain   Latex Rash    Local area  (gloves  - when worn) Local area Local area  (gloves - when worn)   Statins Other (See Comments)    Joint pain    Past Medical History:  Diagnosis Date   Allergy    Anxiety    Aortic stenosis    Barrett's esophagus    BPH (benign prostatic hypertrophy)    Diaphragm dysfunction    right frozen diaphragm   Diverticulitis    GERD (gastroesophageal reflux disease)    Hiatal hernia    Hx of adenomatous colonic polyps    Hyperlipidemia    Kidney stones    Obstructive sleep apnea    BiPAP   Osteoarthritis    Osteoporosis    PONV (postoperative nausea and vomiting)    Spinal stenosis     Past Surgical History:  Procedure Laterality Date   CATARACT EXTRACTION W/PHACO Right 04/20/2020   Procedure: CATARACT EXTRACTION PHACO AND INTRAOCULAR LENS PLACEMENT (IOC) RIGHT;  Surgeon: Rosa College, MD;  Location: [facility];  Service: Ophthalmology;  Laterality: Right;  5.21 0:38.6   CATARACT EXTRACTION W/PHACO Left 05/11/2020   Procedure: CATARACT EXTRACTION PHACO AND INTRAOCULAR LENS PLACEMENT (IOC) LEFT;  Surgeon: Rosa College, MD;  Location: [facility];  Service: Ophthalmology;  Laterality: Left;  4.15 0:30.7   ESOPHAGOGASTRODUODENOSCOPY  colon--6/04, Barrett's--7/05, Barrett's but no cancer--4/08   EYE MUSCLE SURGERY     L eye muscle surgery--2/01   JOINT REPLACEMENT  1/12   left TKR   KNEE ARTHROSCOPY     7/ 09 Arthroscopy left knee--Dr Jinger Mount   LIPOMA EXCISION Left 2010   compressing nerve by spine---Dr Nolon Baxter at [facility] CUFF REPAIR     R rotator cuff repair--1/00  Jinger Mount)   ROTATOR CUFF REPAIR Left ~2005 & 11/15   biceps repair also in 2015   TENDON REPAIR     Left knee tendon repair--1966   TOTAL KNEE ARTHROPLASTY Right 4/16   VASECTOMY     Vasectomy/spermatocele/?testicular cyst--1970    Family History  Problem Relation Age of Onset   Hypertension Mother    Breast cancer Mother    Colon polyps Mother    Coronary artery disease  Mother    Diabetes Neg Hx    Colon cancer Neg Hx    Esophageal cancer Neg Hx    Gallbladder disease Neg Hx    Kidney disease Neg Hx     Social History   Socioeconomic History   Marital status: Married    Spouse name: Not on file   Number of children: 3   Years of education: Not on file   Highest education level: Not on file  Occupational History   Occupation: retired    Associate Professor: RETIRED  Tobacco Use   Smoking status: Never    Passive exposure: Never   Smokeless tobacco: Never  Vaping Use   Vaping status: Never Used  Substance and Sexual Activity   Alcohol use: Not Currently    Alcohol/week: 0.0 standard drinks of alcohol    Comment: occassional wine   Drug use: No   Sexual activity: Not on file  Other Topics Concern   Not on file  Social History Narrative   Lives at home with wife      Has living will   Wife is health care POA---alternate daughter Corbin Dess   Would accept resuscitation attempts   No tube feeds if cognitively unaware         Social Drivers of Health   Financial Resource Strain: Not on file  Food Insecurity: Not on file  Transportation Needs: Not on file  Physical Activity: Not on file  Stress: Not on file  Social Connections: Not on file  Intimate Partner Violence: Not on file   Review of Systems No N/V Eating "fair"     Objective:   Physical Exam Constitutional:      Appearance: Normal appearance.  HENT:     Head:     Comments: Right maxillary tenderness    Right Ear: Tympanic membrane and ear canal normal.     Left Ear: Tympanic membrane and ear canal normal.     Mouth/Throat:     Pharynx: No oropharyngeal exudate or posterior oropharyngeal erythema.  Pulmonary:     Effort: Pulmonary effort is normal.     Breath sounds: Normal breath sounds. No wheezing or rales.  Musculoskeletal:     Cervical back: Neck supple.  Lymphadenopathy:     Cervical: No cervical adenopathy.  Neurological:     Mental Status: [name] is alert.             Assessment & Plan:

## 2023-09-06 NOTE — Assessment & Plan Note (Signed)
 Typical symptoms for him Discussed supportive care Flonase  doesn't help Can use neti pot once congestion is better Augmentin  875 bid x 7 days

## 2023-09-08 DIAGNOSIS — R351 Nocturia: Secondary | ICD-10-CM | POA: Diagnosis not present

## 2023-09-08 DIAGNOSIS — L723 Sebaceous cyst: Secondary | ICD-10-CM | POA: Diagnosis not present

## 2023-09-08 DIAGNOSIS — N401 Enlarged prostate with lower urinary tract symptoms: Secondary | ICD-10-CM | POA: Diagnosis not present

## 2023-10-16 DIAGNOSIS — J392 Other diseases of pharynx: Secondary | ICD-10-CM | POA: Diagnosis not present

## 2023-10-16 DIAGNOSIS — R0981 Nasal congestion: Secondary | ICD-10-CM | POA: Diagnosis not present

## 2023-10-16 DIAGNOSIS — H6981 Other specified disorders of Eustachian tube, right ear: Secondary | ICD-10-CM | POA: Diagnosis not present

## 2023-10-16 DIAGNOSIS — H6123 Impacted cerumen, bilateral: Secondary | ICD-10-CM | POA: Diagnosis not present

## 2023-10-19 DIAGNOSIS — M961 Postlaminectomy syndrome, not elsewhere classified: Secondary | ICD-10-CM | POA: Diagnosis not present

## 2023-10-19 DIAGNOSIS — M51362 Other intervertebral disc degeneration, lumbar region with discogenic back pain and lower extremity pain: Secondary | ICD-10-CM | POA: Diagnosis not present

## 2023-10-31 ENCOUNTER — Ambulatory Visit: Payer: Medicare Other | Admitting: Internal Medicine

## 2023-11-16 ENCOUNTER — Ambulatory Visit (INDEPENDENT_AMBULATORY_CARE_PROVIDER_SITE_OTHER): Admitting: Internal Medicine

## 2023-11-16 ENCOUNTER — Encounter: Payer: Self-pay | Admitting: Internal Medicine

## 2023-11-16 VITALS — BP 114/80 | HR 70 | Temp 98.5°F | Ht 67.0 in | Wt 214.0 lb

## 2023-11-16 DIAGNOSIS — M48061 Spinal stenosis, lumbar region without neurogenic claudication: Secondary | ICD-10-CM | POA: Diagnosis not present

## 2023-11-16 DIAGNOSIS — F439 Reaction to severe stress, unspecified: Secondary | ICD-10-CM | POA: Insufficient documentation

## 2023-11-16 NOTE — Progress Notes (Signed)
 Subjective:    Patient ID: [name], male    DOB: [birth year], 83 y.o.   MRN: [id number]  HPI Here due to worsening back pain  Using wife's walker---but is not in good shape Needs this for stability He notices that he bends over more as he walks Ibuprofen  400mg  in AM--then tylenol  500-1000mg  later. That holds him for the day Tumeric in evening  Wife's condition is worsening Memory is declining---forgets that she has put in laundry, forgets much of what he says Has him frustrated She does have a check up coming up--discussed that he needs to go with her  Current Outpatient Medications on File Prior to Visit  Medication Sig Dispense Refill   alfuzosin  (UROXATRAL ) 10 MG 24 hr tablet Take 1 tablet (10 mg total) by mouth daily. 90 tablet 3   aspirin 81 MG tablet Take 81 mg by mouth every other day.     doxazosin  (CARDURA ) 4 MG tablet Take 1 tablet (4 mg total) by mouth daily. 90 tablet 3   esomeprazole  (NEXIUM ) 40 MG capsule TAKE ONE CAPSULE BY MOUTH EVERY MORNING BEFORE BREAKFAST 90 capsule 3   finasteride  (PROSCAR ) 5 MG tablet Take 1 tablet (5 mg total) by mouth daily. 90 tablet 3   ibuprofen  (ADVIL ) 200 MG tablet Take 600 mg by mouth 2 (two) times daily.     loratadine (CLARITIN) 10 MG tablet Take 10 mg by mouth daily.     OVER THE COUNTER MEDICATION Deep Blue Polyphenol     pravastatin  (PRAVACHOL ) 40 MG tablet Take 1 tablet (40 mg total) by mouth daily. 90 tablet 3   triamcinolone  cream (KENALOG ) 0.1 % Apply 1 Application topically 2 (two) times daily as needed. 45 g 1   No current facility-administered medications on file prior to visit.    Allergies  Allergen Reactions   Anesthetics, Amide Nausea Only    Other reaction(s): Vomiting After surgery patient has problems with nausea and vomiting due to anesthetics   Other Nausea Only and Nausea And Vomiting    After surgery patient has problems with nausea and vomiting due to anesthetics   Atorvastatin     REACTION:  myalgias   Oxycodone Nausea And Vomiting   Simvastatin     REACTION: joint and stomach pain   Latex Rash    Local area  (gloves - when worn) Local area Local area  (gloves - when worn)   Statins Other (See Comments)    Joint pain    Past Medical History:  Diagnosis Date   Allergy    Anxiety    Aortic stenosis    Barrett's esophagus    BPH (benign prostatic hypertrophy)    Diaphragm dysfunction    right frozen diaphragm   Diverticulitis    GERD (gastroesophageal reflux disease)    Hiatal hernia    Hx of adenomatous colonic polyps    Hyperlipidemia    Kidney stones    Obstructive sleep apnea    BiPAP   Osteoarthritis    Osteoporosis    PONV (postoperative nausea and vomiting)    Spinal stenosis     Past Surgical History:  Procedure Laterality Date   CATARACT EXTRACTION W/PHACO Right 04/20/2020   Procedure: CATARACT EXTRACTION PHACO AND INTRAOCULAR LENS PLACEMENT (IOC) RIGHT;  Surgeon: Myrna Adine Anes, MD;  Location: [facility];  Service: Ophthalmology;  Laterality: Right;  5.21 0:38.6   CATARACT EXTRACTION W/PHACO Left 05/11/2020   Procedure: CATARACT EXTRACTION PHACO AND INTRAOCULAR LENS PLACEMENT (IOC)  LEFT;  Surgeon: Myrna Adine Anes, MD;  Location: [facility];  Service: Ophthalmology;  Laterality: Left;  4.15 0:30.7   ESOPHAGOGASTRODUODENOSCOPY     colon--6/04, Barrett's--7/05, Barrett's but no cancer--4/08   EYE MUSCLE SURGERY     L eye muscle surgery--2/01   JOINT REPLACEMENT  1/12   left TKR   KNEE ARTHROSCOPY     7/ 09 Arthroscopy left knee--Dr Jane   LIPOMA EXCISION Left 2010   compressing nerve by spine---Dr Alonza at [facility] CUFF REPAIR     R rotator cuff repair--1/00  Zella)   ROTATOR CUFF REPAIR Left ~2005 & 11/15   biceps repair also in 2015   TENDON REPAIR     Left knee tendon repair--1966   TOTAL KNEE ARTHROPLASTY Right 4/16   VASECTOMY     Vasectomy/spermatocele/?testicular cyst--1970    Family History   Problem Relation Age of Onset   Hypertension Mother    Breast cancer Mother    Colon polyps Mother    Coronary artery disease Mother    Diabetes Neg Hx    Colon cancer Neg Hx    Esophageal cancer Neg Hx    Gallbladder disease Neg Hx    Kidney disease Neg Hx     Social History   Socioeconomic History   Marital status: Married    Spouse name: Not on file   Number of children: 3   Years of education: Not on file   Highest education level: Not on file  Occupational History   Occupation: retired    Associate Professor: RETIRED  Tobacco Use   Smoking status: Never    Passive exposure: Never   Smokeless tobacco: Never  Vaping Use   Vaping status: Never Used  Substance and Sexual Activity   Alcohol use: Not Currently    Alcohol/week: 0.0 standard drinks of alcohol    Comment: occassional wine   Drug use: No   Sexual activity: Not on file  Other Topics Concern   Not on file  Social History Narrative   Lives at home with wife      Has living will   Wife is health care POA---alternate daughter Grayce   Would accept resuscitation attempts   No tube feeds if cognitively unaware         Social Drivers of Health   Financial Resource Strain: Not on file  Food Insecurity: Not on file  Transportation Needs: Not on file  Physical Activity: Not on file  Stress: Not on file  Social Connections: Not on file  Intimate Partner Violence: Not on file   Review of Systems     Objective:   Physical Exam Constitutional:      Appearance: Normal appearance.  Neurological:     Mental Status: He is alert.  Psychiatric:        Mood and Affect: Mood normal.        Behavior: Behavior normal.            Assessment & Plan:

## 2023-11-16 NOTE — Assessment & Plan Note (Signed)
 Ongoing pain issues Will give Rx for rollator---discussed walking with some flexion makes sense Handicapped permit written

## 2023-11-16 NOTE — Assessment & Plan Note (Signed)
 Wife with apparent early dementia Discussed issues with care giving, etc Might want to consider [facility]

## 2023-11-20 ENCOUNTER — Encounter: Payer: Self-pay | Admitting: *Deleted

## 2023-11-20 ENCOUNTER — Ambulatory Visit: Payer: Self-pay

## 2023-11-20 NOTE — Telephone Encounter (Signed)
 Copied from CRM [id number]. Topic: Clinical - Pink Word Triage >> Nov 20, 2023 12:52 PM Robinson H wrote: Reason for Triage: Wife Landry states patient has had a virus last 3 days, nauseous and diarrhea    Landry [phone] This encounter was created in error - please disregard.

## 2023-11-20 NOTE — Telephone Encounter (Addendum)
 FYI Only or Action Required?: FYI only for provider.  Patient was last seen in primary care on 11/16/2023 by Jimmy Charlie FERNS, MD.  Called Nurse Triage reporting Diarrhea.  Symptoms began several days ago.  Interventions attempted: OTC medications: immodium.  Symptoms are: gradually improving.  Triage Disposition: Call PCP Within 24 Hours  Patient/caregiver understands and will follow disposition?:     First attempt made; no answer    Reason for Triage: Patients wife is calling in stating that the patient has had a stomach virus for 3 days and is not improving, patient has diarrhea, they are looking for what they can do to help him get better.

## 2023-11-21 NOTE — Telephone Encounter (Signed)
 Spoke to pt. He said he was feeling a lot better today.

## 2024-01-03 DIAGNOSIS — L821 Other seborrheic keratosis: Secondary | ICD-10-CM | POA: Diagnosis not present

## 2024-01-03 DIAGNOSIS — L723 Sebaceous cyst: Secondary | ICD-10-CM | POA: Diagnosis not present

## 2024-01-03 DIAGNOSIS — Z85828 Personal history of other malignant neoplasm of skin: Secondary | ICD-10-CM | POA: Diagnosis not present

## 2024-01-03 DIAGNOSIS — D485 Neoplasm of uncertain behavior of skin: Secondary | ICD-10-CM | POA: Diagnosis not present

## 2024-01-03 DIAGNOSIS — C44719 Basal cell carcinoma of skin of left lower limb, including hip: Secondary | ICD-10-CM | POA: Diagnosis not present

## 2024-01-03 DIAGNOSIS — D1801 Hemangioma of skin and subcutaneous tissue: Secondary | ICD-10-CM | POA: Diagnosis not present

## 2024-01-24 DIAGNOSIS — E785 Hyperlipidemia, unspecified: Secondary | ICD-10-CM | POA: Diagnosis not present

## 2024-01-24 DIAGNOSIS — G4733 Obstructive sleep apnea (adult) (pediatric): Secondary | ICD-10-CM | POA: Diagnosis not present

## 2024-01-24 DIAGNOSIS — Z1331 Encounter for screening for depression: Secondary | ICD-10-CM | POA: Diagnosis not present

## 2024-01-24 DIAGNOSIS — I350 Nonrheumatic aortic (valve) stenosis: Secondary | ICD-10-CM | POA: Diagnosis not present

## 2024-01-24 DIAGNOSIS — M48062 Spinal stenosis, lumbar region with neurogenic claudication: Secondary | ICD-10-CM | POA: Diagnosis not present

## 2024-01-24 DIAGNOSIS — I7121 Aneurysm of the ascending aorta, without rupture: Secondary | ICD-10-CM | POA: Diagnosis not present

## 2024-02-05 DIAGNOSIS — E785 Hyperlipidemia, unspecified: Secondary | ICD-10-CM | POA: Diagnosis not present

## 2024-02-05 DIAGNOSIS — M19011 Primary osteoarthritis, right shoulder: Secondary | ICD-10-CM | POA: Diagnosis not present

## 2024-02-05 DIAGNOSIS — I7121 Aneurysm of the ascending aorta, without rupture: Secondary | ICD-10-CM | POA: Diagnosis not present

## 2024-02-05 DIAGNOSIS — G4733 Obstructive sleep apnea (adult) (pediatric): Secondary | ICD-10-CM | POA: Diagnosis not present

## 2024-03-08 DIAGNOSIS — L97511 Non-pressure chronic ulcer of other part of right foot limited to breakdown of skin: Secondary | ICD-10-CM | POA: Diagnosis not present

## 2024-03-08 DIAGNOSIS — M79675 Pain in left toe(s): Secondary | ICD-10-CM | POA: Diagnosis not present

## 2024-03-08 DIAGNOSIS — B351 Tinea unguium: Secondary | ICD-10-CM | POA: Diagnosis not present

## 2024-03-08 DIAGNOSIS — M79674 Pain in right toe(s): Secondary | ICD-10-CM | POA: Diagnosis not present

## 2024-03-26 ENCOUNTER — Ambulatory Visit

## 2024-03-26 ENCOUNTER — Ambulatory Visit
Admission: EM | Admit: 2024-03-26 | Discharge: 2024-03-26 | Disposition: A | Discharge status: Home / Self Care | Attending: Physician Assistant | Admitting: Physician Assistant

## 2024-03-26 DIAGNOSIS — M25511 Pain in right shoulder: Secondary | ICD-10-CM

## 2024-03-26 DIAGNOSIS — Z043 Encounter for examination and observation following other accident: Secondary | ICD-10-CM | POA: Diagnosis not present

## 2024-03-26 DIAGNOSIS — R0789 Other chest pain: Secondary | ICD-10-CM

## 2024-03-26 DIAGNOSIS — G8929 Other chronic pain: Secondary | ICD-10-CM | POA: Diagnosis not present

## 2024-03-26 DIAGNOSIS — M19011 Primary osteoarthritis, right shoulder: Secondary | ICD-10-CM

## 2024-03-26 DIAGNOSIS — W19XXXA Unspecified fall, initial encounter: Secondary | ICD-10-CM | POA: Diagnosis not present

## 2024-03-26 NOTE — ED Triage Notes (Signed)
 Pt c/o right shoulder pain and rib pain x2days  Pt states that he has always had right shoulder pain, but he fell while walking up some stairs.  Pt states that his toe caught the top step, making him lunge forward and hit his right ribs against the threshold.  Pt states that he has a lot of pain in his shoulder and ribs.

## 2024-03-26 NOTE — Discharge Instructions (Signed)
-   No fractures seen on your x-ray. - You likely sprained your ribs and have flared up your chronic shoulder pain. - We discussed safe medications for you to take including Tylenol  500 mg tablets-1-2 2-3 times daily as needed for pain.  You can also take 250 mg Aleve-1-2 daily as needed for pain.  May also use topical muscle rubs, lidocaine  patches, ice, heat. - Continue to follow-up with your orthopedist for evaluation of your chronic shoulder pain. - If at any point you develop fever, significant cough, increased pain in chest or ribs, breathing difficulty you should be seen again right away either here or in the ER.

## 2024-03-26 NOTE — ED Provider Notes (Signed)
 [facility]    CSN: [id number] Arrival date & time: 03/26/24  1414      History   Chief Complaint Chief Complaint  Patient presents with   Fall    HPI [name] is a 83 y.o. male with history of chronic right shoulder pain with 2 previous surgeries for rotator cuff repair, spinal stenosis, aortic stenosis, GERD, barrett's esophagus, osteoporosis, anxiety, hyperlipidemia, OSA, renal stones, right sided diaphragmatic dysfunction, and obesity.  Patient presents today for right shoulder and rib pain following accidental fall 2 days ago.  He states that he fell and hit the right side of his ribs and arm on a door frame in cement.  No major head injury or loss of consciousness.  He states his right shoulder is constantly hurting but worsened after he fell.  He reports reduced range of motion of the shoulder that has not gotten any worse since the injury.  He has about 90 degrees flexion and abduction of the shoulder.  He reports pain in the right lateral and anterior ribs that is worse with movement such as leaning forward and coughing.  Denies feeling short of breath or having difficulty breathing, cough, fever.  He has taken Tylenol  and the occasional Aleve and states that helps.  He states he tries to take natural remedies since his daughter is a doctor of osteopathy and he does not like taking a lot of medications.  HPI  Past Medical History:  Diagnosis Date   Allergy    Anxiety    Aortic stenosis    Barrett's esophagus    BPH (benign prostatic hypertrophy)    Diaphragm dysfunction    right frozen diaphragm   Diverticulitis    GERD (gastroesophageal reflux disease)    Hiatal hernia    Hx of adenomatous colonic polyps    Hyperlipidemia    Kidney stones    Obstructive sleep apnea    BiPAP   Osteoarthritis    Osteoporosis    PONV (postoperative nausea and vomiting)    Spinal stenosis     Patient Active Problem List   Diagnosis Date Noted   Situational  stress 11/16/2023   Testicular pain 08/02/2023   Intestinal gas excretion 10/31/2022   Aortic root enlargement 09/12/2021   Nonspecific abnormal electrocardiogram (ECG) (EKG) 09/12/2021   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   Osteoarthritis of both shoulders 08/27/2020   Nonrheumatic aortic valve insufficiency 11/10/2019   Mood disorder 03/06/2019   Aortic atherosclerosis 12/25/2017   Aortic valve stenosis 11/20/2017   Ascending aortic aneurysm 11/20/2017   Acute non-recurrent maxillary sinusitis 10/10/2017   Fatigue 06/27/2017   SOB (shortness of breath) 07/25/2016   Rosacea 03/12/2015   Advance directive discussed with patient 12/15/2014   Arthritis of knee, degenerative 07/23/2014   Obstructive sleep apnea    Class 1 obesity 03/13/2014   Benign fibroma of prostate 03/13/2014   Osteoarthritis, hand 10/20/2011   Routine general medical examination at a health care facility 07/26/2011   Osteoarthritis, multiple sites 07/10/2009   Obesity 12/04/2008   Essential hypertension 08/13/2008   BARRETTS ESOPHAGUS 08/13/2008   BPH with obstruction/lower urinary tract symptoms 12/12/2007   Degenerative lumbar spinal stenosis 09/21/2007   Hyperlipemia 10/09/2006   Allergic rhinitis 10/09/2006   GERD 10/09/2006   Diverticulosis 10/09/2006    Past Surgical History:  Procedure Laterality Date   CATARACT EXTRACTION W/PHACO Right 04/20/2020   Procedure: CATARACT EXTRACTION PHACO AND INTRAOCULAR LENS PLACEMENT (IOC) RIGHT;  Surgeon: Myrna Adine Anes, MD;  Location:  [facility];  Service: Ophthalmology;  Laterality: Right;  5.21 0:38.6   CATARACT EXTRACTION W/PHACO Left 05/11/2020   Procedure: CATARACT EXTRACTION PHACO AND INTRAOCULAR LENS PLACEMENT (IOC) LEFT;  Surgeon: Myrna Adine Anes, MD;  Location: [facility];  Service: Ophthalmology;  Laterality: Left;  4.15 0:30.7   ESOPHAGOGASTRODUODENOSCOPY     colon--6/04, Barrett's--7/05, Barrett's but no cancer--4/08   EYE  MUSCLE SURGERY     L eye muscle surgery--2/01   JOINT REPLACEMENT  1/12   left TKR   KNEE ARTHROSCOPY     7/ 09 Arthroscopy left knee--Dr Jane   LIPOMA EXCISION Left 2010   compressing nerve by spine---Dr Alonza at [facility] CUFF REPAIR     R rotator cuff repair--1/00  Zella)   ROTATOR CUFF REPAIR Left ~2005 & 11/15   biceps repair also in 2015   TENDON REPAIR     Left knee tendon repair--1966   TOTAL KNEE ARTHROPLASTY Right 4/16   VASECTOMY     Vasectomy/spermatocele/?testicular cyst--1970       Home Medications    Prior to Admission medications   Medication Sig Start Date End Date Taking? Authorizing Provider  alfuzosin  (UROXATRAL ) 10 MG 24 hr tablet Take 1 tablet (10 mg total) by mouth daily. 03/06/19  Yes Jimmy Charlie FERNS, MD  aspirin 81 MG tablet Take 81 mg by mouth every other day.   Yes [provider]  doxazosin  (CARDURA ) 4 MG tablet Take 1 tablet (4 mg total) by mouth daily. 05/12/22  Yes Jimmy Charlie FERNS, MD  esomeprazole  (NEXIUM ) 40 MG capsule TAKE ONE CAPSULE BY MOUTH EVERY MORNING BEFORE BREAKFAST 05/04/23  Yes Jimmy Charlie FERNS, MD  finasteride  (PROSCAR ) 5 MG tablet Take 1 tablet (5 mg total) by mouth daily. 11/18/19  Yes Jimmy Charlie FERNS, MD  ibuprofen  (ADVIL ) 200 MG tablet Take 600 mg by mouth 2 (two) times daily.   Yes [provider]  loratadine (CLARITIN) 10 MG tablet Take 10 mg by mouth daily.   Yes [provider]  OVER THE COUNTER MEDICATION Deep Blue Polyphenol   Yes [provider]  pravastatin  (PRAVACHOL ) 40 MG tablet Take 1 tablet (40 mg total) by mouth daily. 05/25/23  Yes Jimmy Charlie FERNS, MD  triamcinolone  cream (KENALOG ) 0.1 % Apply 1 Application topically 2 (two) times daily as needed. 10/31/22  Yes Jimmy Charlie FERNS, MD    Family History Family History  Problem Relation Age of Onset   Hypertension Mother    Breast cancer Mother    Colon polyps Mother    Coronary artery disease Mother    Diabetes  Neg Hx    Colon cancer Neg Hx    Esophageal cancer Neg Hx    Gallbladder disease Neg Hx    Kidney disease Neg Hx     Social History Social History   Tobacco Use   Smoking status: Never    Passive exposure: Never   Smokeless tobacco: Never  Vaping Use   Vaping status: Never Used  Substance Use Topics   Alcohol use: Not Currently    Alcohol/week: 0.0 standard drinks of alcohol    Comment: occassional wine   Drug use: No     Allergies   Anesthetics, amide; Other; Atorvastatin; Oxycodone; Simvastatin; Latex; and Statins   Review of Systems Review of Systems  Respiratory:  Negative for shortness of breath.   Cardiovascular:  Negative for chest pain.  Gastrointestinal:  Negative for abdominal pain.  Musculoskeletal:  Positive for arthralgias (  right ribs and right shoulder). Negative for back pain and neck pain.  Skin:  Negative for color change and wound.  Neurological:  Negative for dizziness, syncope, weakness, numbness and headaches.     Physical Exam Triage Vital Signs ED Triage Vitals  Encounter Vitals Group     BP      Girls Systolic BP Percentile      Girls Diastolic BP Percentile      Boys Systolic BP Percentile      Boys Diastolic BP Percentile      Pulse      Resp      Temp      Temp src      SpO2      Weight      Height      Head Circumference      Peak Flow      Pain Score      Pain Loc      Pain Education      Exclude from Growth Chart    No data found.  Updated Vital Signs BP (!) 152/73 (BP Location: Left Arm)   Pulse 77   Temp 98.4 F (36.9 C) (Oral)   Wt 211 lb (95.7 kg)   SpO2 94%   BMI 33.05 kg/m       Physical Exam Vitals and nursing note reviewed.  Constitutional:      General: He is not in acute distress.    Appearance: Normal appearance. He is well-developed. He is not ill-appearing.  HENT:     Head: Normocephalic and atraumatic.  Eyes:     General: No scleral icterus.    Conjunctiva/sclera: Conjunctivae normal.   Cardiovascular:     Rate and Rhythm: Normal rate and regular rhythm.  Pulmonary:     Effort: Pulmonary effort is normal. No respiratory distress.     Breath sounds: Normal breath sounds.  Chest:     Chest wall: Tenderness (right anterior and lateral lower ribs) present.  Abdominal:     Palpations: Abdomen is soft.     Tenderness: There is no abdominal tenderness.  Musculoskeletal:        General: No swelling or deformity.     Right shoulder: Tenderness (biceps groove, lateral deltoid) and bony tenderness (proximal humerus) present. Decreased range of motion (90 degrees flexion and abduction). Normal pulse.     Cervical back: Neck supple.  Skin:    General: Skin is warm and dry.     Capillary Refill: Capillary refill takes less than 2 seconds.  Neurological:     General: No focal deficit present.     Mental Status: He is alert. Mental status is at baseline.     Motor: No weakness.     Gait: Gait normal.  Psychiatric:        Mood and Affect: Mood normal.        Behavior: Behavior normal.      UC Treatments / Results  Labs (all labs ordered are listed, but only abnormal results are displayed) Labs Reviewed - No data to display  EKG   Radiology DG Ribs Unilateral W/Chest Right Result Date: 03/26/2024 CLINICAL DATA:  Fall and trauma to the right ribs. EXAM: RIGHT RIBS AND CHEST - 3+ VIEW COMPARISON:  Chest CT dated 07/25/2016. FINDINGS: There is eventration of the right hemidiaphragm with right lung base atelectasis. No focal consolidation, pleural effusion or pneumothorax. The cardiac silhouette is within limits. Atherosclerotic calcification of the aorta. No acute osseous pathology. No  displaced rib fractures. IMPRESSION: 1. No acute cardiopulmonary process. 2. No displaced rib fractures. Electronically Signed   By: Vanetta Chou M.D.   On: 03/26/2024 15:39   DG Shoulder Right Result Date: 03/26/2024 CLINICAL DATA:  Status post fall. EXAM: RIGHT SHOULDER - 2+ VIEW  COMPARISON:  None Available. FINDINGS: There is no evidence of an acute fracture or dislocation. A 2.8 cm chronic cortical opacity is seen within the soft tissues adjacent to the greater tubercle of the right humeral head. Moderate severity degenerative changes seen involving the right acromioclavicular joint and right glenohumeral articulation. Evidence of prior rotator cuff repair is seen. Soft tissues are unremarkable. IMPRESSION: 1. No acute fracture or dislocation. 2. Moderate severity degenerative changes involving the right acromioclavicular joint and right glenohumeral articulation. Electronically Signed   By: Suzen Dials M.D.   On: 03/26/2024 15:38    Procedures Procedures (including critical care time)  Medications Ordered in UC Medications - No data to display  Initial Impression / Assessment and Plan / UC Course  I have reviewed the triage vital signs and the nursing notes.  Pertinent labs & imaging results that were available during my care of the patient were reviewed by me and considered in my medical decision making (see chart for details).   83 year old male with history of chronic right shoulder pain presents for increased right shoulder pain and right rib pain following accidental fall 2 days ago.  Patient has taken the occasional Aleve and Tylenol  and it has helped somewhat but he wants to make sure he does not have a rib fracture.  He denies any difficulty breathing or shortness of breath, swelling, bruising, chest pain.  X-ray of right shoulder and right ribs/chest obtained today.  No acute fractures noted.  He does have moderate degenerative changes involving the right AC joint and right glenohumeral articulation.  History of 2 previous rotator cuff repairs.  Does follow-up with orthopedics.  He says his orthopedist does not want to do any more surgery on him and would like him to go to PT and treat the pain at home.  Discussed all imaging results with patient.  We  discussed different medications for pain relief and he does not want to take any narcotics.  We discussed taking Tylenol  and low-dose NSAIDs as needed for severe pain.  Also discussed stretching, heat, ice.  He reports using heat therapy and finds it to be beneficial.  Advised him to make a follow-up appoint with his orthopedist as he could benefit from a corticosteroid injection into the shoulder.  Reviewed return precautions.   Final Clinical Impressions(s) / UC Diagnoses   Final diagnoses:  Fall, initial encounter  Rib pain on right side  Chronic right shoulder pain  Arthritis of right shoulder     Discharge Instructions      - No fractures seen on your x-ray. - You likely sprained your ribs and have flared up your chronic shoulder pain. - We discussed safe medications for you to take including Tylenol  500 mg tablets-1-2 2-3 times daily as needed for pain.  You can also take 250 mg Aleve-1-2 daily as needed for pain.  May also use topical muscle rubs, lidocaine  patches, ice, heat. - Continue to follow-up with your orthopedist for evaluation of your chronic shoulder pain. - If at any point you develop fever, significant cough, increased pain in chest or ribs, breathing difficulty you should be seen again right away either here or in the ER.  ED Prescriptions   None    PDMP not reviewed this encounter.   Arvis Jolan NOVAK, PA-C 03/26/24 1711

## 2024-04-12 DIAGNOSIS — Z961 Presence of intraocular lens: Secondary | ICD-10-CM | POA: Diagnosis not present

## 2024-04-15 DIAGNOSIS — H60541 Acute eczematoid otitis externa, right ear: Secondary | ICD-10-CM | POA: Diagnosis not present

## 2024-04-15 DIAGNOSIS — H6123 Impacted cerumen, bilateral: Secondary | ICD-10-CM | POA: Diagnosis not present

## 2024-04-19 DIAGNOSIS — L97511 Non-pressure chronic ulcer of other part of right foot limited to breakdown of skin: Secondary | ICD-10-CM | POA: Diagnosis not present

## 2024-06-12 ENCOUNTER — Telehealth: Payer: Self-pay | Admitting: Cardiology

## 2024-06-12 MED ORDER — PRAVASTATIN SODIUM 40 MG PO TABS: 40.0000 mg | ORAL_TABLET | Freq: Every day | ORAL | 0 refills | Status: AC

## 2024-06-12 NOTE — Telephone Encounter (Signed)
 Refill sent

## 2024-06-12 NOTE — Telephone Encounter (Signed)
" °*  STAT* If patient is at the pharmacy, call can be transferred to refill team.   1. Which medications need to be refilled? (please list name of each medication and dose if known)   pravastatin  (PRAVACHOL ) 40 MG tablet    2. Would you like to learn more about the convenience, safety, & potential cost savings by using the [facility]? No    3. Are you open to using the [facility] (Type [facility]. No    4. Which pharmacy/location (including street and city if local pharmacy) is medication to be sent to?\ [facility] [id number] - [place], [place] - [address]      5. Do they need a 30 day or 90 day supply? 90 day   "
